# Patient Record
Sex: Male | Born: 1975 | Marital: Married | Smoking: Never smoker
Health system: Southern US, Community
[De-identification: ages and names within clinical notes are randomized; demographics above are authoritative.]

## PROBLEM LIST (undated history)

## (undated) DIAGNOSIS — T4145XA Adverse effect of unspecified anesthetic, initial encounter: Secondary | ICD-10-CM

## (undated) DIAGNOSIS — E669 Obesity, unspecified: Secondary | ICD-10-CM

## (undated) DIAGNOSIS — M5412 Radiculopathy, cervical region: Secondary | ICD-10-CM

## (undated) DIAGNOSIS — T8859XA Other complications of anesthesia, initial encounter: Secondary | ICD-10-CM

## (undated) HISTORY — PX: WISDOM TOOTH EXTRACTION: SHX21

## (undated) HISTORY — PX: TONSILLECTOMY: SUR1361

---
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
## 2019-09-21 ENCOUNTER — Other Ambulatory Visit: Payer: Self-pay | Admitting: Neurosurgery

## 2019-09-21 ENCOUNTER — Other Ambulatory Visit: Payer: Self-pay | Admitting: Internal Medicine

## 2019-09-21 DIAGNOSIS — M5412 Radiculopathy, cervical region: Secondary | ICD-10-CM

## 2019-09-21 DIAGNOSIS — M4312 Spondylolisthesis, cervical region: Secondary | ICD-10-CM

## 2019-09-22 ENCOUNTER — Other Ambulatory Visit (HOSPITAL_COMMUNITY): Payer: Self-pay | Admitting: Neurosurgery

## 2019-09-22 DIAGNOSIS — M4312 Spondylolisthesis, cervical region: Secondary | ICD-10-CM

## 2019-09-22 DIAGNOSIS — M5412 Radiculopathy, cervical region: Secondary | ICD-10-CM

## 2019-09-29 ENCOUNTER — Ambulatory Visit (HOSPITAL_COMMUNITY)
Admission: RE | Admit: 2019-09-29 | Discharge: 2019-09-29 | Disposition: A | Discharge status: Home / Self Care | Payer: BC Managed Care – PPO | Source: Ambulatory Visit | Attending: Neurosurgery | Admitting: Neurosurgery

## 2019-09-29 ENCOUNTER — Other Ambulatory Visit: Payer: Self-pay

## 2019-09-29 DIAGNOSIS — M4312 Spondylolisthesis, cervical region: Secondary | ICD-10-CM | POA: Insufficient documentation

## 2019-09-30 ENCOUNTER — Ambulatory Visit (HOSPITAL_COMMUNITY)
Admission: RE | Admit: 2019-09-30 | Discharge: 2019-09-30 | Disposition: A | Discharge status: Home / Self Care | Payer: BC Managed Care – PPO | Source: Ambulatory Visit | Attending: Neurosurgery | Admitting: Neurosurgery

## 2019-09-30 DIAGNOSIS — M4312 Spondylolisthesis, cervical region: Secondary | ICD-10-CM | POA: Diagnosis not present

## 2019-09-30 DIAGNOSIS — M5412 Radiculopathy, cervical region: Secondary | ICD-10-CM | POA: Insufficient documentation

## 2019-09-30 MED ORDER — GADOBUTROL 1 MMOL/ML IV SOLN
10.0000 mL | Freq: Once | INTRAVENOUS | Status: AC | PRN
  Administered 2019-09-30: 10 mL via INTRAVENOUS

## 2019-10-02 ENCOUNTER — Other Ambulatory Visit: Payer: Self-pay

## 2019-10-02 ENCOUNTER — Other Ambulatory Visit: Payer: Self-pay | Admitting: Radiation Therapy

## 2019-10-02 ENCOUNTER — Inpatient Hospital Stay (HOSPITAL_COMMUNITY)
Admission: AD | Admit: 2019-10-02 | Discharge: 2019-10-05 | DRG: 477 | Disposition: A | Discharge status: Home / Self Care | Payer: BC Managed Care – PPO | Source: Ambulatory Visit | Attending: Internal Medicine | Admitting: Internal Medicine

## 2019-10-02 ENCOUNTER — Encounter (HOSPITAL_COMMUNITY): Payer: Self-pay | Admitting: Internal Medicine

## 2019-10-02 DIAGNOSIS — C7951 Secondary malignant neoplasm of bone: Secondary | ICD-10-CM | POA: Diagnosis present

## 2019-10-02 DIAGNOSIS — R030 Elevated blood-pressure reading, without diagnosis of hypertension: Secondary | ICD-10-CM | POA: Diagnosis present

## 2019-10-02 DIAGNOSIS — T380X5A Adverse effect of glucocorticoids and synthetic analogues, initial encounter: Secondary | ICD-10-CM | POA: Diagnosis present

## 2019-10-02 DIAGNOSIS — K760 Fatty (change of) liver, not elsewhere classified: Secondary | ICD-10-CM | POA: Diagnosis present

## 2019-10-02 DIAGNOSIS — Z6841 Body Mass Index (BMI) 40.0 and over, adult: Secondary | ICD-10-CM

## 2019-10-02 DIAGNOSIS — X58XXXA Exposure to other specified factors, initial encounter: Secondary | ICD-10-CM | POA: Diagnosis present

## 2019-10-02 DIAGNOSIS — U071 COVID-19: Secondary | ICD-10-CM | POA: Diagnosis present

## 2019-10-02 DIAGNOSIS — Z23 Encounter for immunization: Secondary | ICD-10-CM

## 2019-10-02 DIAGNOSIS — Z9181 History of falling: Secondary | ICD-10-CM | POA: Diagnosis not present

## 2019-10-02 DIAGNOSIS — Z20828 Contact with and (suspected) exposure to other viral communicable diseases: Secondary | ICD-10-CM | POA: Diagnosis present

## 2019-10-02 DIAGNOSIS — N888 Other specified noninflammatory disorders of cervix uteri: Secondary | ICD-10-CM

## 2019-10-02 DIAGNOSIS — D72829 Elevated white blood cell count, unspecified: Secondary | ICD-10-CM | POA: Diagnosis present

## 2019-10-02 DIAGNOSIS — E669 Obesity, unspecified: Secondary | ICD-10-CM | POA: Diagnosis present

## 2019-10-02 DIAGNOSIS — C801 Malignant (primary) neoplasm, unspecified: Secondary | ICD-10-CM | POA: Diagnosis present

## 2019-10-02 HISTORY — DX: Obesity, unspecified: E66.9

## 2019-10-02 LAB — GLUCOSE, CAPILLARY: Glucose-Capillary: 116 mg/dL — ABNORMAL HIGH (ref 70–99)

## 2019-10-02 MED ORDER — CYCLOBENZAPRINE HCL 5 MG PO TABS
5.0000 mg | ORAL_TABLET | Freq: Three times a day (TID) | ORAL | Status: DC | PRN
  Administered 2019-10-04 (×2): 5 mg via ORAL
  Filled 2019-10-02 (×4): qty 1

## 2019-10-02 MED ORDER — IBUPROFEN 200 MG PO TABS
600.0000 mg | ORAL_TABLET | Freq: Four times a day (QID) | ORAL | Status: DC | PRN
  Administered 2019-10-05 (×2): 600 mg via ORAL
  Filled 2019-10-02 (×2): qty 3

## 2019-10-02 MED ORDER — HYDROCODONE-ACETAMINOPHEN 5-325 MG PO TABS
1.0000 | ORAL_TABLET | Freq: Four times a day (QID) | ORAL | Status: DC | PRN
  Administered 2019-10-03 (×2): 2 via ORAL
  Administered 2019-10-03: 1 via ORAL
  Administered 2019-10-04 – 2019-10-05 (×3): 2 via ORAL
  Filled 2019-10-02 (×7): qty 2

## 2019-10-02 MED ORDER — ACETAMINOPHEN 650 MG RE SUPP: 650.0000 mg | Freq: Four times a day (QID) | RECTAL | Status: DC | PRN

## 2019-10-02 MED ORDER — PANTOPRAZOLE SODIUM 40 MG PO TBEC
40.0000 mg | DELAYED_RELEASE_TABLET | Freq: Every day | ORAL | Status: DC
  Administered 2019-10-02 – 2019-10-05 (×4): 40 mg via ORAL
  Filled 2019-10-02 (×4): qty 1

## 2019-10-02 MED ORDER — IBUPROFEN 200 MG PO TABS: 600.0000 mg | ORAL_TABLET | Freq: Four times a day (QID) | ORAL | Status: DC | PRN

## 2019-10-02 MED ORDER — INFLUENZA VAC SPLIT QUAD 0.5 ML IM SUSY
0.5000 mL | PREFILLED_SYRINGE | INTRAMUSCULAR | Status: AC
  Administered 2019-10-04: 0.5 mL via INTRAMUSCULAR
  Filled 2019-10-02: qty 0.5

## 2019-10-02 MED ORDER — ONDANSETRON HCL 4 MG/2ML IJ SOLN: 4.0000 mg | Freq: Four times a day (QID) | INTRAMUSCULAR | Status: DC | PRN

## 2019-10-02 MED ORDER — DEXAMETHASONE 4 MG PO TABS
4.0000 mg | ORAL_TABLET | Freq: Four times a day (QID) | ORAL | Status: DC
  Administered 2019-10-02 – 2019-10-05 (×11): 4 mg via ORAL
  Filled 2019-10-02 (×14): qty 1

## 2019-10-02 MED ORDER — ACETAMINOPHEN 325 MG PO TABS: 650.0000 mg | ORAL_TABLET | Freq: Four times a day (QID) | ORAL | Status: DC | PRN

## 2019-10-02 MED ORDER — ENOXAPARIN SODIUM 40 MG/0.4ML ~~LOC~~ SOLN
40.0000 mg | SUBCUTANEOUS | Status: DC
  Administered 2019-10-02: 40 mg via SUBCUTANEOUS
  Filled 2019-10-02: qty 0.4

## 2019-10-02 MED ORDER — LORATADINE 10 MG PO TABS
10.0000 mg | ORAL_TABLET | Freq: Every day | ORAL | Status: DC
  Administered 2019-10-03 – 2019-10-05 (×3): 10 mg via ORAL
  Filled 2019-10-02 (×3): qty 1

## 2019-10-02 MED ORDER — ONDANSETRON HCL 4 MG PO TABS: 4.0000 mg | ORAL_TABLET | Freq: Four times a day (QID) | ORAL | Status: DC | PRN

## 2019-10-02 NOTE — Plan of Care (Signed)
Current plan of care discussed with the patient.

## 2019-10-02 NOTE — H&P (Addendum)
History and Physical    Scott Eaton [id number] DOB: [birth year] DOA: 10/02/2019  PCP: Patient, No Pcp Per  Patient coming from: Home  I have personally briefly reviewed patient's old medical records in [place]  Chief Complaint: Neck pain, arm weakness  HPI: Scott Eaton is a 43 y.o. male with medical history significant of obesity but otherwise healthy.  Patient has been having neck pain since a fall down stairs last [place].  This has progressed to B arm weakness these past 2 weeks.  Patient followed by Dr. Vertell Limber.  Dr. Vertell Limber obtained CT scan on 23rd and MRI on 24th of C spine which are suggestive of metastatic CA to neck, thecal sac impingement but no cord signal abnormality, primary not known.  Patient sent in to [facility] as a direct admit from Dr. Melven Sartorius office  Review of Systems: As per HPI, otherwise all review of systems negative.  Past Medical History:  Diagnosis Date  . Obesity     History reviewed. No pertinent surgical history.   reports that he has never smoked. He does not have any smokeless tobacco history on file. He reports current alcohol use. He reports that he does not use drugs.  No Known Allergies  Family History  Problem Relation Age of Onset  . Cancer Neg Hx      Prior to Admission medications   Not on File    Physical Exam: Vitals:   10/02/19 2000  BP: 127/69  Pulse: (!) 102  Resp: 16  Temp: 98.6 F (37 C)  TempSrc: Oral  SpO2: 98%    Constitutional: NAD, calm, comfortable Eyes: PERRL, lids and conjunctivae normal ENMT: Mucous membranes are moist. Posterior pharynx clear of any exudate or lesions.Normal dentition.  Neck: normal, supple, no masses, no thyromegaly Respiratory: clear to auscultation bilaterally, no wheezing, no crackles. Normal respiratory effort. No accessory muscle use.  Cardiovascular: Regular rate and rhythm, no murmurs / rubs / gallops. No extremity edema. 2+ pedal pulses. No carotid bruits.  Abdomen: no  tenderness, no masses palpated. No hepatosplenomegaly. Bowel sounds positive.  Musculoskeletal: no clubbing / cyanosis. No joint deformity upper and lower extremities. Good ROM, no contractures. Normal muscle tone.  Skin: no rashes, lesions, ulcers. No induration Neurologic: CN 2-12 grossly intact. Sensation intact, DTR normal. Mild BUE weakness. Psychiatric: Normal judgment and insight. Alert and oriented x 3. Normal mood.    Labs on Admission: I have personally reviewed following labs and imaging studies  CBC: No results for input(s): WBC, NEUTROABS, HGB, HCT, MCV, PLT in the last 168 hours. Basic Metabolic Panel: No results for input(s): NA, K, CL, CO2, GLUCOSE, BUN, CREATININE, CALCIUM, MG, PHOS in the last 168 hours. GFR: CrCl cannot be calculated (No successful lab value found.). Liver Function Tests: No results for input(s): AST, ALT, ALKPHOS, BILITOT, PROT, ALBUMIN in the last 168 hours. No results for input(s): LIPASE, AMYLASE in the last 168 hours. No results for input(s): AMMONIA in the last 168 hours. Coagulation Profile: No results for input(s): INR, PROTIME in the last 168 hours. Cardiac Enzymes: No results for input(s): CKTOTAL, CKMB, CKMBINDEX, TROPONINI in the last 168 hours. BNP (last 3 results) No results for input(s): PROBNP in the last 8760 hours. HbA1C: No results for input(s): HGBA1C in the last 72 hours. CBG: No results for input(s): GLUCAP in the last 168 hours. Lipid Profile: No results for input(s): CHOL, HDL, LDLCALC, TRIG, CHOLHDL, LDLDIRECT in the last 72 hours. Thyroid Function Tests: No results for input(s): TSH,  T4TOTAL, FREET4, T3FREE, THYROIDAB in the last 72 hours. Anemia Panel: No results for input(s): VITAMINB12, FOLATE, FERRITIN, TIBC, IRON, RETICCTPCT in the last 72 hours. Urine analysis: No results found for: COLORURINE, APPEARANCEUR, LABSPEC, PHURINE, GLUCOSEU, HGBUR, BILIRUBINUR, KETONESUR, PROTEINUR, UROBILINOGEN, NITRITE, LEUKOCYTESUR   Radiological Exams on Admission: No results found.  EKG: Independently reviewed.  Assessment/Plan Principal Problem:   Metastatic cancer to spine ([place])    1. 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 Obtain CBC/CMP 2. Plan for CT chest, abd, pelvis to look for primary / dz extent. 3. Surgical vs IR consult for Bx depending on CT findings 4. Neuro checks Q4H 5. Rad onc consult in AM, spoke with Dr. Isidore Moos 1. Did rec to start decadron 2. They will see in AM  DVT prophylaxis: Lovenox Code Status: Full Family Communication: Family at bedside Disposition Plan: Home after admit Consults called: Spoke with Dr. Isidore Moos Admission status: Admit to inpatient  Severity of Illness: The appropriate patient status for this patient is INPATIENT. Inpatient status is judged to be reasonable and necessary in order to provide the required intensity of service to ensure the patient's safety. The patient's presenting symptoms, physical exam findings, and initial radiographic and laboratory data in the context of their chronic comorbidities is felt to place them at high risk for further clinical deterioration. Furthermore, it is not anticipated that the patient will be medically stable for discharge from the hospital within 2 midnights of admission. The following factors support the patient status of inpatient.   IP status for new diagnosis of metastatic CA to C.Spine with cord impingement, unknown primary.   * I certify that at the point of admission it is my clinical judgment that the patient will require inpatient hospital care spanning beyond 2 midnights from the point of admission due to high intensity of service, high risk for further deterioration and high frequency of surveillance required.*    Scott Eaton M. DO [facility]  How to contact the [facility] Attending or Consulting provider [place] or covering provider during after  hours [place], for this patient?  1. Check the care team in [facility] and look for a) attending/consulting [facility] provider listed and b) the [facility] team listed 2. Log into [email or web address]  Amion Physician Scheduling and messaging for groups and whole hospitals  On call and physician scheduling software for group practices, residents, hospitalists and other medical providers for call, clinic, rotation and shift schedules. On[employer] is a hospital-wide system for scheduling doctors and paging doctors on call. EasyPlot is for scientific plotting and data analysis.  [email or web address]  and use [place]'s universal password to access. If you do not have the password, please contact the hospital operator.  3. Locate the [facility] provider you are looking for under [facility] and page to a number that you can be directly reached. 4. If you still have difficulty reaching the provider, please page the [facility] (Director on Call) for the Hospitalists listed on amion for assistance.  10/02/2019, 8:46 PM

## 2019-10-03 ENCOUNTER — Ambulatory Visit
Admit: 2019-10-03 | Discharge: 2019-10-03 | Disposition: A | Discharge status: Home / Self Care | Payer: BC Managed Care – PPO | Attending: Radiation Oncology | Admitting: Radiation Oncology

## 2019-10-03 ENCOUNTER — Inpatient Hospital Stay (HOSPITAL_COMMUNITY): Payer: BC Managed Care – PPO

## 2019-10-03 ENCOUNTER — Encounter (HOSPITAL_COMMUNITY): Payer: Self-pay | Admitting: *Deleted

## 2019-10-03 DIAGNOSIS — C7951 Secondary malignant neoplasm of bone: Secondary | ICD-10-CM

## 2019-10-03 LAB — CBC
HCT: 44.4 % (ref 39.0–52.0)
Hemoglobin: 14.6 g/dL (ref 13.0–17.0)
MCH: 29.5 pg (ref 26.0–34.0)
MCHC: 32.9 g/dL (ref 30.0–36.0)
MCV: 89.7 fL (ref 80.0–100.0)
Platelets: 315 10*3/uL (ref 150–400)
RBC: 4.95 MIL/uL (ref 4.22–5.81)
RDW: 12.6 % (ref 11.5–15.5)
WBC: 8.6 10*3/uL (ref 4.0–10.5)
nRBC: 0 % (ref 0.0–0.2)

## 2019-10-03 LAB — COMPREHENSIVE METABOLIC PANEL
ALT: 68 U/L — ABNORMAL HIGH (ref 0–44)
AST: 26 U/L (ref 15–41)
Albumin: 4.6 g/dL (ref 3.5–5.0)
Alkaline Phosphatase: 69 U/L (ref 38–126)
Anion gap: 9 (ref 5–15)
BUN: 12 mg/dL (ref 6–20)
CO2: 27 mmol/L (ref 22–32)
Calcium: 10 mg/dL (ref 8.9–10.3)
Chloride: 101 mmol/L (ref 98–111)
Creatinine, Ser: 0.81 mg/dL (ref 0.61–1.24)
GFR calc Af Amer: >60 mL/min (ref >60)
GFR calc non Af Amer: >60 mL/min (ref >60)
Glucose, Bld: 154 mg/dL — ABNORMAL HIGH (ref 70–99)
Potassium: 5.1 mmol/L (ref 3.5–5.1)
Sodium: 137 mmol/L (ref 135–145)
Total Bilirubin: 0.4 mg/dL (ref 0.3–1.2)
Total Protein: 7.6 g/dL (ref 6.5–8.1)

## 2019-10-03 LAB — FERRITIN: Ferritin: 227 ng/mL (ref 24–336)

## 2019-10-03 LAB — LACTIC ACID, PLASMA
Lactic Acid, Venous: 3.3 mmol/L (ref 0.5–1.9)
Lactic Acid, Venous: 3.9 mmol/L (ref 0.5–1.9)

## 2019-10-03 LAB — GLUCOSE, CAPILLARY
Glucose-Capillary: 155 mg/dL — ABNORMAL HIGH (ref 70–99)
Glucose-Capillary: 173 mg/dL — ABNORMAL HIGH (ref 70–99)
Glucose-Capillary: 204 mg/dL — ABNORMAL HIGH (ref 70–99)
Glucose-Capillary: 208 mg/dL — ABNORMAL HIGH (ref 70–99)

## 2019-10-03 LAB — HIV ANTIBODY (ROUTINE TESTING W REFLEX): HIV Screen 4th Generation wRfx: NONREACTIVE

## 2019-10-03 LAB — SARS CORONAVIRUS 2 (TAT 6-24 HRS): SARS Coronavirus 2: POSITIVE — AB

## 2019-10-03 LAB — D-DIMER, QUANTITATIVE: D-Dimer, Quant: 0.31 ug/mL-FEU (ref 0.00–0.50)

## 2019-10-03 LAB — C-REACTIVE PROTEIN: CRP: 1 mg/dL — ABNORMAL HIGH (ref <1.0)

## 2019-10-03 MED ORDER — IOHEXOL 300 MG/ML  SOLN: 30.0000 mL | Freq: Once | INTRAMUSCULAR | Status: DC | PRN

## 2019-10-03 MED ORDER — ENOXAPARIN SODIUM 80 MG/0.8ML ~~LOC~~ SOLN
80.0000 mg | SUBCUTANEOUS | Status: DC
  Administered 2019-10-03: 80 mg via SUBCUTANEOUS
  Filled 2019-10-03: qty 0.8

## 2019-10-03 MED ORDER — IOHEXOL 300 MG/ML  SOLN
100.0000 mL | Freq: Once | INTRAMUSCULAR | Status: AC | PRN
  Administered 2019-10-03: 100 mL via INTRAVENOUS

## 2019-10-03 MED ORDER — SODIUM CHLORIDE (PF) 0.9 % IJ SOLN
INTRAMUSCULAR | Status: AC
  Filled 2019-10-03: qty 50

## 2019-10-03 NOTE — Progress Notes (Signed)
[facility] called stating that pts COVID positive.

## 2019-10-03 NOTE — Progress Notes (Signed)
Patient's Vitals: 99.56F;HR115;RR16;172/83 (MAP105);97% Room Air. PCP was notified

## 2019-10-03 NOTE — Progress Notes (Signed)
Pt transported to CT with RN present. Pt transported to room 1436 after CT complete. Pt denies pain at this time and pts wife updated. Report given to 4th floor RN at the bedside.

## 2019-10-03 NOTE — Progress Notes (Signed)
CRITICAL VALUE ALERT  Critical Value:  Lactic Acid 3.3  Date & Time Notied:  10/03/2019 1735  Provider Notified: Dr. Wyonia Hough  Orders Received/Actions taken:

## 2019-10-03 NOTE — Progress Notes (Signed)
Patient's Vitals were: 98.29F;HR  112; RR 18;171/70 (MAP 95);98% Room Air.  PCP was notified.

## 2019-10-03 NOTE — Consult Note (Signed)
[facility]  [phone] Fax:([phone] [place]  Initial inpatient Consultation- conducted via telephone in light of current COVID-19 pandemic and recommendations to limit patient exposure in the healthcare setting.  Referral MD: Dr. 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  The patient reports that he has been having pain to his neck since at least February 2020.  Over the past 3 to 5 weeks, his pain has become more severe.  He has also developed numbness in his bilateral arms and hands.  He has been taking hydrocodone at home for pain control.  The patient was seen by Dr. Vertell Limber as an outpatient.  He initially had a CT of the cervical spine without contrast which showed extensive tumor destroying the left side of the C4 vertebral body with extensive destruction of the left side of the C4 vertebral body, left pedicle, left lamina, spinous process, and a portion of the right lamina, tumor extends into the spinal canal and left neural foramina at C4-5 and C3-4, the tumor compresses the thecal sac and spinal cord at C3-4 and C4-5 and behind the C4 vertebral body, tumor extends into the left lateral recesses and left neural foramina at C3-4 and C4-5 and into the left posterior lateral spinal musculature at C3-4 and C4-5.  He subsequently underwent an MRI of the cervical spine with and without contrast on 09/30/2019.  This showed enhancing tumor of the C4 vertebral body and posterior elements with epidural extension into the left C3-4 and C4-5 neural foramina, metastatic disease is suspected, epidural extension on the left dorsal spinal canal causes mass-effect on the thecal sac without spinal cord change, left vertebral artery is encased by the C4 mass, mass within the right carotid space at the C2 level measuring 3.5 x 2.4 cm  which is concerning for metastatic disease in the context of a known vertebral lesion.  The patient was sent to the hospital for direct admission to begin urgent radiation and for further work-up.  The patient reports other than his neck pain and numbness in his arms and hands, he has been feeling well.  Denies fevers and chills.  Denies anorexia and weight loss.  He has not had any headaches or dizziness.  Patient denies chest discomfort, shortness of breath, cough, hemoptysis.  Denies abdominal pain, nausea, vomiting, constipation, diarrhea.  Denies bleeding.  The patient lives in [place], [place].  He is married.  He has one 45 year old child.  Reports social alcohol use.  Denies history of tobacco.  Denies family history of cancers or blood disorders.  The patient had a positive COVID-19 test upon admission.  Medical oncology was notified of the patient's admission to make further recommendations regarding work-up and recommendations for biopsy.   Past Medical History:  Diagnosis Date  . Obesity   :  History reviewed. No pertinent surgical history.:  Current Facility-Administered Medications  Medication Dose Route Frequency Provider Last Rate Last Dose  . acetaminophen (TYLENOL) tablet 650 mg  650 mg Oral Q6H PRN Etta Quill, DO       Or  . acetaminophen (TYLENOL) suppository 650 mg  650 mg Rectal Q6H PRN Etta Quill, DO      . cyclobenzaprine (FLEXERIL) tablet 5 mg  5 mg Oral TID PRN Etta Quill, DO      . dexamethasone (DECADRON)  tablet 4 mg  4 mg Oral Q6H Jennette Kettle M, DO   4 mg at 10/03/19 1205  . enoxaparin (LOVENOX) injection 80 mg  80 mg Subcutaneous Q24H Minda Ditto, RPH      . HYDROcodone-acetaminophen (NORCO/VICODIN) 5-325 MG per tablet 1-2 tablet  1-2 tablet Oral Q6H PRN Etta Quill, DO   2 tablet at 10/03/19 0645  . ibuprofen (ADVIL) tablet 600 mg  600 mg Oral Q6H PRN Etta Quill, DO      . influenza vac split quadrivalent PF (FLUARIX)  injection 0.5 mL  0.5 mL Intramuscular Tomorrow-1000 Alcario Drought, Jared M, DO      . iohexol (OMNIPAQUE) 300 MG/ML solution 30 mL  30 mL Oral Once PRN Etta Quill, DO      . loratadine (CLARITIN) tablet 10 mg  10 mg Oral Daily Jennette Kettle M, DO   10 mg at 10/03/19 0905  . ondansetron (ZOFRAN) tablet 4 mg  4 mg Oral Q6H PRN Etta Quill, DO       Or  . ondansetron [facility]) injection 4 mg  4 mg Intravenous Q6H PRN Etta Quill, DO      . pantoprazole (PROTONIX) EC tablet 40 mg  40 mg Oral Daily Jennette Kettle M, DO   40 mg at 10/03/19 0905     No Known Allergies:  Family History  Problem Relation Age of Onset  . Cancer Neg Hx   :  Social History   Socioeconomic History  . Marital status: Married    Spouse name: Not on file  . Number of children: Not on file  . Years of education: Not on file  . Highest education level: Not on file  Occupational History  . Not on file  Social Needs  . Financial resource strain: Not on file  . Food insecurity    Worry: Not on file    Inability: Not on file  . Transportation needs    Medical: Not on file    Non-medical: Not on file  Tobacco Use  . Smoking status: Never Smoker  . Smokeless tobacco: Never Used  Substance and Sexual Activity  . Alcohol use: Yes    Comment: occ  . Drug use: Never  . Sexual activity: Not on file  Lifestyle  . Physical activity    Days per week: Not on file    Minutes per session: Not on file  . Stress: Not on file  Relationships  . Social Herbalist on phone: Not on file    Gets together: Not on file    Attends religious service: Not on file    Active member of club or organization: Not on file    Attends meetings of clubs or organizations: Not on file    Relationship status: Not on file  . Intimate partner violence    Fear of current or ex partner: Not on file    Emotionally abused: Not on file    Physically abused: Not on file    Forced sexual activity: Not on file  Other  Topics Concern  . Not on file  Social History Narrative  . Not on file  :  Review of Systems: A comprehensive 14 point review of systems was negative except as noted in the HPI.  Exam: Patient Vitals for the past 24 hrs:  BP Temp Temp src Pulse Resp SpO2 Height Weight  10/03/19 0512 (!) 153/83 97.9 F (36.6 C) - 98 16 98 % - -  10/02/19 2100 - - - 100 16 - - -  10/02/19 2000 127/69 98.6 F (37 C) Oral (!) 102 16 98 % 6\' 1"  (1.854 m) (!) 335 lb (152 kg)    No exam performed due to COVID 19+ status.  Lab Results  Component Value Date   WBC 8.6 10/03/2019   HGB 14.6 10/03/2019   HCT 44.4 10/03/2019   PLT 315 10/03/2019   GLUCOSE 154 (H) 10/03/2019   ALT 68 (H) 10/03/2019   AST 26 10/03/2019   NA 137 10/03/2019   K 5.1 10/03/2019   CL 101 10/03/2019   CREATININE 0.81 10/03/2019   BUN 12 10/03/2019   CO2 27 10/03/2019    Ct Cervical Spine Wo Contrast  Result Date: 09/29/2019 CLINICAL DATA:  Posterior neck pain radiating into both shoulders. EXAM: CT CERVICAL SPINE WITHOUT CONTRAST TECHNIQUE: Multidetector CT imaging of the cervical spine was performed without intravenous contrast. Multiplanar CT image reconstructions were also generated. COMPARISON:  Radiographs dated 09/13/2019 FINDINGS: Alignment: There is a 3 mm anterolisthesis of C3 on C4. Skull base and vertebrae: There is extensive destruction of left side of the C4 vertebral body with extensive destruction of the left lateral mass, left pedicle, left lamina, spinous process, and partial destruction of the right lamina. There is a pathologic fracture involving the vertebral body and left lateral mass. Tumor extends into the spinal canal compressing the spinal cord. Soft tissues and spinal canal: The tumor destroying the C4 vertebral body extends into the spinal canal compressing the spinal cord and thecal sac. The tumor extends into the left neural foramen and is immediately adjacent to the left vertebral artery. Disc levels:   C2-3: Normal. C3-4: 3 mm anterolisthesis of C3 on C4. No appreciable disc bulging or foraminal stenosis. However, there is abnormal soft tissue in and lateral to the left neural foramen obscuring the left vertebral artery with abnormal density in the left lateral recess and left neural foramen with abnormal density surrounding the left side of the thecal sac which likely represents there is also abnormal soft tissue surrounding the left lateral mass in the left transverse process and left lamina consistent with tumor extending into the soft tissues. C4-5: Tumor destroys at least 50% of the C4 vertebral body, the left pedicle, left lateral mass, left lamina, spinous processes, left transverse process, and a portion of the right lamina with pathologic fractures of the vertebral body and lateral mass. Tumor extends into the spinal canal severely compressing the thecal sac. Tumor also extends into the paraspinal soft tissues and around the left vertebral artery and into the left posterolateral spinal musculature at C4. Tumor extends into the left neural foramen. C5-6: Negative. C6-7: Negative. C7-T1: Negative. T1-2 and T2-3: Negative. Upper chest: Normal. Other: None IMPRESSION: 1. 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 2. Tumor extends into the spinal canal and left neural foramina at C4-5 and C3-4. The tumor compresses the thecal sac and spinal cord at C3-4 and C4-5 and behind the C4 vertebral body. 3. Tumor extends into the left lateral recesses and left neural foramina at C3-4 and C4-5 and into the left posterolateral spinal musculature at C3-4 and C4-5. 4. 3 mm anterolisthesis of C3 on C4. at 3:35 pm to Raquel Sarna, Dr. Melven Sartorius assistant, who verbally acknowledged these results. Electronically Signed   By: Jeneen Rinks  Maxwell M.D.   On: 09/29/2019  15:51   Mr Cervical Spine W Wo Contrast  Result Date: 10/01/2019 CLINICAL DATA:  Posterior neck pain with recently demonstrated vertebral tumor. EXAM: MRI CERVICAL SPINE WITHOUT AND WITH CONTRAST TECHNIQUE: Multiplanar and multiecho pulse sequences of the cervical spine, to include the craniocervical junction and cervicothoracic junction, were obtained without and with intravenous contrast. CONTRAST:  8mL GADAVIST GADOBUTROL 1 MMOL/ML IV SOLN COMPARISON:  CT cervical spine 09/29/2019 MR cervical spine 02/28/2019 FINDINGS: Alignment: Grade 1 anterolisthesis at C3-4, unchanged. Vertebrae: There is abnormal low T1/high T2-weighted signal within the C4 vertebral body and extending into the left posterior elements. There is associated abnormal contrast enhancement that extends into the adjacent soft tissues. No other focal vertebral lesion. Vertebral body heights are normal. Cord: There is epidural extension of the tumor along the left dorsal aspect of the spinal canal at C4. Posterior Fossa, vertebral arteries, paraspinal tissues: The left vertebral artery is encased by the abnormally enhancing material. There is a mass within the right carotid space at the C2 level that measures 3.5 x 2.4 cm. Disc levels: C1-C2: Normal. C2-C3: Normal disc space and facets. No spinal canal or neuroforaminal stenosis. C3-C4: There is extension of the C4 tumor into the left C3-4 neural foramen. There is tumor along the left dorsal epidural space. There is attenuation of the thecal sac. C4-C5: As above, contrast enhancing tumor of the left vertebral body and posterior elements that extends to the left epidural space and the left C3-4 and C4-5 neural foramina. The thecal sac is effaced due to mass effect from the tumor. No signal change within the spinal cord. C5-C6: Normal disc space and facets. No spinal canal or neuroforaminal stenosis. C6-C7: Normal disc space and facets. No spinal canal or neuroforaminal stenosis. C7-T1: Normal  disc space and facets. No spinal canal or neuroforaminal stenosis. IMPRESSION: 1. Enhancing tumor of the C4 vertebral body and posterior elements with epidural extension into the left C3-4 and C4-5 neural foramina. Metastatic disease is suspected. 2. Epidural extension on the left dorsal spinal canal causes mass effect on the thecal sac without spinal cord signal change. 3. The left vertebral artery is encased by the C4 mass. 4. Mass within the right carotid space at the C2 level, measuring 3.5 x 2.4 cm. This is concerning for metastatic disease in the context of the known vertebral lesion. Paraganglioma is an alternative possibility. Electronically Signed   By: Ulyses Jarred M.D.   On: 10/01/2019 22:19     Ct Cervical Spine Wo Contrast  Result Date: 09/29/2019 CLINICAL DATA:  Posterior neck pain radiating into both shoulders. EXAM: CT CERVICAL SPINE WITHOUT CONTRAST TECHNIQUE: Multidetector CT imaging of the cervical spine was performed without intravenous contrast. Multiplanar CT image reconstructions were also generated. COMPARISON:  Radiographs dated 09/13/2019 FINDINGS: Alignment: There is a 3 mm anterolisthesis of C3 on C4. Skull base and vertebrae: There is extensive destruction of left side of the C4 vertebral body with extensive destruction of the left lateral mass, left pedicle, left lamina, spinous process, and partial destruction of the right lamina. There is a pathologic fracture involving the vertebral body and left lateral mass. Tumor extends into the spinal canal compressing the spinal cord. Soft tissues and spinal canal: The tumor destroying the C4 vertebral body extends into the spinal canal compressing the spinal cord and thecal sac. The tumor extends into the left neural foramen and is immediately adjacent to the left vertebral artery. Disc levels:  C2-3: Normal.  C3-4: 3 mm anterolisthesis of C3 on C4. No appreciable disc bulging or foraminal stenosis. However, there is abnormal soft  tissue in and lateral to the left neural foramen obscuring the left vertebral artery with abnormal density in the left lateral recess and left neural foramen with abnormal density surrounding the left side of the thecal sac which likely represents there is also abnormal soft tissue surrounding the left lateral mass in the left transverse process and left lamina consistent with tumor extending into the soft tissues. C4-5: Tumor destroys at least 50% of the C4 vertebral body, the left pedicle, left lateral mass, left lamina, spinous processes, left transverse process, and a portion of the right lamina with pathologic fractures of the vertebral body and lateral mass. Tumor extends into the spinal canal severely compressing the thecal sac. Tumor also extends into the paraspinal soft tissues and around the left vertebral artery and into the left posterolateral spinal musculature at C4. Tumor extends into the left neural foramen. C5-6: Negative. C6-7: Negative. C7-T1: Negative. T1-2 and T2-3: Negative. Upper chest: Normal. Other: None IMPRESSION: 1. 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 2. Tumor extends into the spinal canal and left neural foramina at C4-5 and C3-4. The tumor compresses the thecal sac and spinal cord at C3-4 and C4-5 and behind the C4 vertebral body. 3. Tumor extends into the left lateral recesses and left neural foramina at C3-4 and C4-5 and into the left posterolateral spinal musculature at C3-4 and C4-5. 4. 3 mm anterolisthesis of C3 on C4. at 3:35 pm to Raquel Sarna, Dr. Melven Sartorius assistant, who verbally acknowledged these results. Electronically Signed   By: Lorriane Shire M.D.   On: 09/29/2019 15:51   Mr Cervical Spine W Wo Contrast  Result Date: 10/01/2019 CLINICAL DATA:  Posterior neck pain with recently demonstrated vertebral  tumor. EXAM: MRI CERVICAL SPINE WITHOUT AND WITH CONTRAST TECHNIQUE: Multiplanar and multiecho pulse sequences of the cervical spine, to include the craniocervical junction and cervicothoracic junction, were obtained without and with intravenous contrast. CONTRAST:  83mL GADAVIST GADOBUTROL 1 MMOL/ML IV SOLN COMPARISON:  CT cervical spine 09/29/2019 MR cervical spine 02/28/2019 FINDINGS: Alignment: Grade 1 anterolisthesis at C3-4, unchanged. Vertebrae: There is abnormal low T1/high T2-weighted signal within the C4 vertebral body and extending into the left posterior elements. There is associated abnormal contrast enhancement that extends into the adjacent soft tissues. No other focal vertebral lesion. Vertebral body heights are normal. Cord: There is epidural extension of the tumor along the left dorsal aspect of the spinal canal at C4. Posterior Fossa, vertebral arteries, paraspinal tissues: The left vertebral artery is encased by the abnormally enhancing material. There is a mass within the right carotid space at the C2 level that measures 3.5 x 2.4 cm. Disc levels: C1-C2: Normal. C2-C3: Normal disc space and facets. No spinal canal or neuroforaminal stenosis. C3-C4: There is extension of the C4 tumor into the left C3-4 neural foramen. There is tumor along the left dorsal epidural space. There is attenuation of the thecal sac. C4-C5: As above, contrast enhancing tumor of the left vertebral body and posterior elements that extends to the left epidural space and the left C3-4 and C4-5 neural foramina. The thecal sac is effaced due to mass effect from the tumor. No signal change within the spinal cord. C5-C6: Normal disc space and  facets. No spinal canal or neuroforaminal stenosis. C6-C7: Normal disc space and facets. No spinal canal or neuroforaminal stenosis. C7-T1: Normal disc space and facets. No spinal canal or neuroforaminal stenosis. IMPRESSION: 1. Enhancing tumor of the C4 vertebral body and posterior  elements with epidural extension into the left C3-4 and C4-5 neural foramina. Metastatic disease is suspected. 2. Epidural extension on the left dorsal spinal canal causes mass effect on the thecal sac without spinal cord signal change. 3. The left vertebral artery is encased by the C4 mass. 4. Mass within the right carotid space at the C2 level, measuring 3.5 x 2.4 cm. This is concerning for metastatic disease in the context of the known vertebral lesion. Paraganglioma is an alternative possibility. Electronically Signed   By: Ulyses Jarred M.D.   On: 10/01/2019 22:19   Assessment and Plan:  1. 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  COVID-19 positive  -Recommend CT the chest, abdomen, pelvis with contrast for initial staging and to determine the best target to biopsy.  This will be performed later today.  Will make recommendations regarding biopsy once these results are available.  We will plan to schedule the patient with medical oncology as an outpatient. -Radiation oncology is aware of the patient's admission and will perform a consult later today.  The patient will need urgent radiation given his impending cord compression. -Dexamethasone per radiation oncology recommendations. -Management of COVID-19 per hospitalist.  He is currently asymptomatic.  Thank you for this referral.   Mikey Bussing, DNP, AGPCNP-BC, AOCNP

## 2019-10-03 NOTE — Progress Notes (Signed)
   10/03/19 1530  Vitals  Temp 98.3 F (36.8 C)  Temp Source Oral  BP (!) 151/87  MAP (mmHg) 104  BP Location Right Arm  BP Method Automatic  Patient Position (if appropriate) Sitting  Pulse Rate (!) 124  Resp 20  Oxygen Therapy  SpO2 98 %  O2 Device Room Air  MEWS Score  MEWS RR 0  MEWS Pulse 2  MEWS Systolic 0  MEWS LOC 0  MEWS Temp 0  MEWS Score 2  MEWS Score Color Yellow  MEWS Assessment  Is this an acute change? Yes  MEWS guidelines implemented *See Row Information* Yellow  Provider Notification  Provider Name/Title Dr. Wyonia Hough  Date Provider Notified 10/03/19  Time Provider Notified 1552  Notification Type Page  Notification Reason Change in status

## 2019-10-03 NOTE — Progress Notes (Signed)
PROGRESS NOTE    Scott Eaton  [id number] DOB: 05-07-1976 DOA: 10/02/2019 PCP: Patient, No Pcp Per   Brief Narrative:  Scott Eaton is an unfortunate 43 y.o. male with medical history significant of obesity but otherwise healthy PTA.  Patient has been having neck pain since a fall down stairs last Xmass.  This has progressed to B arm weakness these past 2 weeks.  Patient followed by Dr. Vertell Limber.  Dr. Vertell Limber obtained CT scan on 23rd and MRI on 24th of C spine which are suggestive of metastatic CA to neck, thecal sac impingement but no cord signal abnormality, primary not known.  Patient sent in to [facility] as a direct admit from Dr. 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 Follow CBC/CMP 2. Plan for CT chest, abd, pelvis to look for primary / dz extent. 3. Surgical vs IR consult for Bx depending on CT findings 4. Neuro checks Q4H 5. Rad onc consult in AM, spoke with Dr. Isidore Moos 1. Did rec to start decadron 2. Await their consult-planned on seeing this AM per admitting [facility]  COVID positive:        1 patient came back Covid positive this morning after admission last night.        2 patient denies known exposures, he is asymptomatic on room air        3 we will follow inflammatory markers    DVT prophylaxis: Lovenox SQ  Code Status: fULL    Code Status Orders  (From admission, onward)         Start     Ordered   10/02/19 2017  Full code  Continuous     10/02/19 2018        Code Status History    This patient has a current code status but no historical code status.   Advance Care Planning Activity     Family Communication: Discussed with wife Disposition Plan:   Patient will remain inpatient for further work-up to include CT chest abdomen pelvis, radiation oncology for possible emergent radiation, IV steroids.   Case complicated with Covid positive test.  Patient not medically ready for discharge consults called: None Admission status: Inpatient   Consultants:   rad onc on admission  Procedures:  Ct Cervical Spine Wo Contrast  Result Date: 09/29/2019 CLINICAL DATA:  Posterior neck pain radiating into both shoulders. EXAM: CT CERVICAL SPINE WITHOUT CONTRAST TECHNIQUE: Multidetector CT imaging of the cervical spine was performed without intravenous contrast. Multiplanar CT image reconstructions were also generated. COMPARISON:  Radiographs dated 09/13/2019 FINDINGS: Alignment: There is a 3 mm anterolisthesis of C3 on C4. Skull base and vertebrae: There is extensive destruction of left side of the C4 vertebral body with extensive destruction of the left lateral mass, left pedicle, left lamina, spinous process, and partial destruction of the right lamina. There is a pathologic fracture involving the vertebral body and left lateral mass. Tumor extends into the spinal canal compressing the spinal cord. Soft tissues and spinal canal: The tumor destroying the C4 vertebral body extends into the spinal canal compressing the spinal cord and thecal sac. The tumor extends into the left neural foramen and is immediately adjacent to the left vertebral artery. Disc levels:  C2-3: Normal. C3-4: 3 mm anterolisthesis of C3 on C4. No appreciable disc bulging or foraminal stenosis. However, there is abnormal  soft tissue in and lateral to the left neural foramen obscuring the left vertebral artery with abnormal density in the left lateral recess and left neural foramen with abnormal density surrounding the left side of the thecal sac which likely represents there is also abnormal soft tissue surrounding the left lateral mass in the left transverse process and left lamina consistent with tumor extending into the soft tissues. C4-5: Tumor destroys at least 50% of the C4 vertebral body, the left pedicle, left lateral mass, left lamina,  spinous processes, left transverse process, and a portion of the right lamina with pathologic fractures of the vertebral body and lateral mass. Tumor extends into the spinal canal severely compressing the thecal sac. Tumor also extends into the paraspinal soft tissues and around the left vertebral artery and into the left posterolateral spinal musculature at C4. Tumor extends into the left neural foramen. C5-6: Negative. C6-7: Negative. C7-T1: Negative. T1-2 and T2-3: Negative. Upper chest: Normal. Other: None IMPRESSION: 1. 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 2. Tumor extends into the spinal canal and left neural foramina at C4-5 and C3-4. The tumor compresses the thecal sac and spinal cord at C3-4 and C4-5 and behind the C4 vertebral body. 3. Tumor extends into the left lateral recesses and left neural foramina at C3-4 and C4-5 and into the left posterolateral spinal musculature at C3-4 and C4-5. 4. 3 mm anterolisthesis of C3 on C4. at 3:35 pm to Raquel Sarna, Dr. Melven Sartorius assistant, who verbally acknowledged these results. Electronically Signed   By: Lorriane Shire M.D.   On: 09/29/2019 15:51   Mr Cervical Spine W Wo Contrast  Result Date: 10/01/2019 CLINICAL DATA:  Posterior neck pain with recently demonstrated vertebral tumor. EXAM: MRI CERVICAL SPINE WITHOUT AND WITH CONTRAST TECHNIQUE: Multiplanar and multiecho pulse sequences of the cervical spine, to include the craniocervical junction and cervicothoracic junction, were obtained without and with intravenous contrast. CONTRAST:  82mL GADAVIST GADOBUTROL 1 MMOL/ML IV SOLN COMPARISON:  CT cervical spine 09/29/2019 MR cervical spine 02/28/2019 FINDINGS: Alignment: Grade 1 anterolisthesis at C3-4, unchanged. Vertebrae: There is abnormal low T1/high T2-weighted signal within the C4  vertebral body and extending into the left posterior elements. There is associated abnormal contrast enhancement that extends into the adjacent soft tissues. No other focal vertebral lesion. Vertebral body heights are normal. Cord: There is epidural extension of the tumor along the left dorsal aspect of the spinal canal at C4. Posterior Fossa, vertebral arteries, paraspinal tissues: The left vertebral artery is encased by the abnormally enhancing material. There is a mass within the right carotid space at the C2 level that measures 3.5 x 2.4 cm. Disc levels: C1-C2: Normal. C2-C3: Normal disc space and facets. No spinal canal or neuroforaminal stenosis. C3-C4: There is extension of the C4 tumor into the left C3-4 neural foramen. There is tumor along the left dorsal epidural space. There is attenuation of the thecal sac. C4-C5: As above, contrast enhancing tumor of the left vertebral body and posterior elements that extends to the left epidural space and the left C3-4 and C4-5 neural foramina. The thecal sac is effaced due to mass effect from the tumor. No signal change within the spinal cord. C5-C6: Normal disc space and facets. No spinal canal or neuroforaminal stenosis. C6-C7: Normal disc space and facets. No spinal canal or neuroforaminal stenosis.  C7-T1: Normal disc space and facets. No spinal canal or neuroforaminal stenosis. IMPRESSION: 1. Enhancing tumor of the C4 vertebral body and posterior elements with epidural extension into the left C3-4 and C4-5 neural foramina. Metastatic disease is suspected. 2. Epidural extension on the left dorsal spinal canal causes mass effect on the thecal sac without spinal cord signal change. 3. The left vertebral artery is encased by the C4 mass. 4. Mass within the right carotid space at the C2 level, measuring 3.5 x 2.4 cm. This is concerning for metastatic disease in the context of the known vertebral lesion. Paraganglioma is an alternative possibility. Electronically Signed    By: Ulyses Jarred M.D.   On: 10/01/2019 22:19     Antimicrobials:   none   Subjective: Patient reports feeling better pain well controlled and secondly improved since starting steroids  Covid test did come back positive this morning  Objective: Vitals:   10/02/19 2000 10/02/19 2100 10/03/19 0512  BP: 127/69  (!) 153/83  Pulse: (!) 102 100 98  Resp: 16 16 16   Temp: 98.6 F (37 C)  97.9 F (36.6 C)  TempSrc: Oral    SpO2: 98%  98%  Weight: (!) 152 kg    Height: 6\' 1"  (1.854 m)     No intake or output data in the 24 hours ending 10/03/19 1435 Filed Weights   10/02/19 2000  Weight: (!) 152 kg    Examination:  General exam: Appears calm and comfortable  Respiratory system: Clear to auscultation. Respiratory effort normal. Cardiovascular system: S1 & S2 heard, RRR. No JVD, murmurs, rubs, gallops or clicks. No pedal edema. Gastrointestinal system: Abdomen is nondistended, soft and nontender. No organomegaly or masses felt. Normal bowel sounds heard. Central nervous system: Alert and oriented. No focal neurological deficits. Extremities: Is moving all 4 extremities freely, neurovascularly intact. Skin: No rashes, lesions or ulcers Psychiatry: Judgement and insight appear normal. Mood & affect appropriate.     Data Reviewed: I have personally reviewed following labs and imaging studies  CBC: Recent Labs  Lab 10/03/19 0625  WBC 8.6  HGB 14.6  HCT 44.4  MCV 89.7  PLT 123456   Basic Metabolic Panel: Recent Labs  Lab 10/03/19 0625  NA 137  K 5.1  CL 101  CO2 27  GLUCOSE 154*  BUN 12  CREATININE 0.81  CALCIUM 10.0   GFR: Estimated Creatinine Clearance: 177.1 mL/min (by C-G formula based on SCr of 0.81 mg/dL). Liver Function Tests: Recent Labs  Lab 10/03/19 0625  AST 26  ALT 68*  ALKPHOS 69  BILITOT 0.4  PROT 7.6  ALBUMIN 4.6   No results for input(s): LIPASE, AMYLASE in the last 168 hours. No results for input(s): AMMONIA in the last 168  hours. Coagulation Profile: No results for input(s): INR, PROTIME in the last 168 hours. Cardiac Enzymes: No results for input(s): CKTOTAL, CKMB, CKMBINDEX, TROPONINI in the last 168 hours. BNP (last 3 results) No results for input(s): PROBNP in the last 8760 hours. HbA1C: No results for input(s): HGBA1C in the last 72 hours. CBG: Recent Labs  Lab 10/02/19 2231 10/03/19 0742 10/03/19 1204  GLUCAP 116* 155* 173*   Lipid Profile: No results for input(s): CHOL, HDL, LDLCALC, TRIG, CHOLHDL, LDLDIRECT in the last 72 hours. Thyroid Function Tests: No results for input(s): TSH, T4TOTAL, FREET4, T3FREE, THYROIDAB in the last 72 hours. Anemia Panel: No results for input(s): VITAMINB12, FOLATE, FERRITIN, TIBC, IRON, RETICCTPCT in the last 72 hours. Sepsis Labs: No results for  input(s): PROCALCITON, LATICACIDVEN in the last 168 hours.  Recent Results (from the past 240 hour(s))  SARS CORONAVIRUS 2 (TAT 6-24 HRS) Nasopharyngeal Nasopharyngeal Swab     Status: Abnormal   Collection Time: 10/02/19  8:57 PM   Specimen: Nasopharyngeal Swab  Result Value Ref Range Status   SARS Coronavirus 2 POSITIVE (A) NEGATIVE Final    Comment: RESULT CALLED TO, READ BACK BY AND VERIFIED WITH: Jorge Ny RN 9:30 10/03/19 ([email or web address]) (NOTE) SARS-CoV-2 target nucleic acids are DETECTED. The SARS-CoV-2 RNA is generally detectable in upper and lower respiratory specimens during the acute phase of infection. Positive results are indicative of active infection with SARS-CoV-2. Clinical  correlation with patient history and other diagnostic information is necessary to determine patient infection status. Positive results do  not rule out bacterial infection or co-infection with other viruses. The expected result is Negative. Fact Sheet for Patients: SugarRoll.be Fact Sheet for Healthcare Providers: https://www.woods-mathews.com/ This test is not yet approved or cleared by  the [place] FDA and  has been authorized for detection and/or diagnosis of SARS-CoV-2 by FDA under an Emergency Use Authorization (EUA). This EUA will remain  in effect (meaning this test can be used) for  the duration of the COVID-19 declaration under Section 564(b)(1) of the Act, 21 U.S.C. section 360bbb-3(b)(1), unless the authorization is terminated or revoked sooner. Performed at [facility], [place] [address], [place], [place] [address]          Radiology Studies: No results found.      Scheduled Meds:  dexamethasone  4 mg Oral Q6H   enoxaparin (LOVENOX) injection  80 mg Subcutaneous Q24H   influenza vac split quadrivalent PF  0.5 mL Intramuscular Tomorrow-1000   loratadine  10 mg Oral Daily   pantoprazole  40 mg Oral Daily   Continuous Infusions:   LOS: 1 day    Time spent: 35 min    Nicolette Bang, MD [facility]  If 7PM-7AM, please contact night-coverage  10/03/2019, 2:35 PM

## 2019-10-04 ENCOUNTER — Other Ambulatory Visit: Payer: Self-pay | Admitting: Radiation Therapy

## 2019-10-04 ENCOUNTER — Inpatient Hospital Stay (HOSPITAL_COMMUNITY): Payer: BC Managed Care – PPO

## 2019-10-04 DIAGNOSIS — D72829 Elevated white blood cell count, unspecified: Secondary | ICD-10-CM

## 2019-10-04 DIAGNOSIS — U071 COVID-19: Secondary | ICD-10-CM

## 2019-10-04 LAB — CBC WITH DIFFERENTIAL/PLATELET
Abs Immature Granulocytes: 0.13 10*3/uL — ABNORMAL HIGH (ref 0.00–0.07)
Basophils Absolute: 0 10*3/uL (ref 0.0–0.1)
Basophils Relative: 0 %
Eosinophils Absolute: 0 10*3/uL (ref 0.0–0.5)
Eosinophils Relative: 0 %
HCT: 46.2 % (ref 39.0–52.0)
Hemoglobin: 14.9 g/dL (ref 13.0–17.0)
Immature Granulocytes: 1 %
Lymphocytes Relative: 5 %
Lymphs Abs: 1.1 10*3/uL (ref 0.7–4.0)
MCH: 29.3 pg (ref 26.0–34.0)
MCHC: 32.3 g/dL (ref 30.0–36.0)
MCV: 90.9 fL (ref 80.0–100.0)
Monocytes Absolute: 0.9 10*3/uL (ref 0.1–1.0)
Monocytes Relative: 5 %
Neutro Abs: 17.8 10*3/uL — ABNORMAL HIGH (ref 1.7–7.7)
Neutrophils Relative %: 89 %
Platelets: 378 10*3/uL (ref 150–400)
RBC: 5.08 MIL/uL (ref 4.22–5.81)
RDW: 13 % (ref 11.5–15.5)
WBC: 19.9 10*3/uL — ABNORMAL HIGH (ref 4.0–10.5)
nRBC: 0 % (ref 0.0–0.2)

## 2019-10-04 LAB — COMPREHENSIVE METABOLIC PANEL
ALT: 62 U/L — ABNORMAL HIGH (ref 0–44)
AST: 27 U/L (ref 15–41)
Albumin: 4.8 g/dL (ref 3.5–5.0)
Alkaline Phosphatase: 58 U/L (ref 38–126)
Anion gap: 13 (ref 5–15)
BUN: 15 mg/dL (ref 6–20)
CO2: 23 mmol/L (ref 22–32)
Calcium: 9.9 mg/dL (ref 8.9–10.3)
Chloride: 100 mmol/L (ref 98–111)
Creatinine, Ser: 0.76 mg/dL (ref 0.61–1.24)
GFR calc Af Amer: >60 mL/min (ref >60)
GFR calc non Af Amer: >60 mL/min (ref >60)
Glucose, Bld: 185 mg/dL — ABNORMAL HIGH (ref 70–99)
Potassium: 4.5 mmol/L (ref 3.5–5.1)
Sodium: 136 mmol/L (ref 135–145)
Total Bilirubin: 0.3 mg/dL (ref 0.3–1.2)
Total Protein: 7.9 g/dL (ref 6.5–8.1)

## 2019-10-04 LAB — MAGNESIUM: Magnesium: 2.1 mg/dL (ref 1.7–2.4)

## 2019-10-04 LAB — GLUCOSE, CAPILLARY
Glucose-Capillary: 159 mg/dL — ABNORMAL HIGH (ref 70–99)
Glucose-Capillary: 249 mg/dL — ABNORMAL HIGH (ref 70–99)
Glucose-Capillary: 133 mg/dL — ABNORMAL HIGH (ref 70–99)
Glucose-Capillary: 229 mg/dL — ABNORMAL HIGH (ref 70–99)

## 2019-10-04 LAB — PROTIME-INR
INR: 0.9 (ref 0.8–1.2)
Prothrombin Time: 12.1 seconds (ref 11.4–15.2)

## 2019-10-04 LAB — FERRITIN: Ferritin: 228 ng/mL (ref 24–336)

## 2019-10-04 LAB — LACTIC ACID, PLASMA: Lactic Acid, Venous: 2.9 mmol/L (ref 0.5–1.9)

## 2019-10-04 LAB — PHOSPHORUS: Phosphorus: 2.8 mg/dL (ref 2.5–4.6)

## 2019-10-04 LAB — C-REACTIVE PROTEIN: CRP: 0.9 mg/dL (ref <1.0)

## 2019-10-04 LAB — PROCALCITONIN: Procalcitonin: <0.1 ng/mL

## 2019-10-04 MED ORDER — VITAMIN C 500 MG PO TABS
500.0000 mg | ORAL_TABLET | Freq: Two times a day (BID) | ORAL | Status: DC
  Administered 2019-10-04 – 2019-10-05 (×3): 500 mg via ORAL
  Filled 2019-10-04 (×3): qty 1

## 2019-10-04 MED ORDER — FENTANYL CITRATE (PF) 100 MCG/2ML IJ SOLN
INTRAMUSCULAR | Status: AC
  Filled 2019-10-04: qty 4

## 2019-10-04 MED ORDER — NALOXONE HCL 0.4 MG/ML IJ SOLN
INTRAMUSCULAR | Status: AC
  Filled 2019-10-04: qty 1

## 2019-10-04 MED ORDER — MIDAZOLAM HCL 2 MG/2ML IJ SOLN
INTRAMUSCULAR | Status: AC
  Filled 2019-10-04: qty 4

## 2019-10-04 MED ORDER — ZINC SULFATE 220 (50 ZN) MG PO CAPS
220.0000 mg | ORAL_CAPSULE | Freq: Every day | ORAL | Status: DC
  Administered 2019-10-04 – 2019-10-05 (×2): 220 mg via ORAL
  Filled 2019-10-04 (×2): qty 1

## 2019-10-04 MED ORDER — SODIUM CHLORIDE 0.9 % IV SOLN
INTRAVENOUS | Status: AC
  Filled 2019-10-04: qty 250

## 2019-10-04 MED ORDER — FLUMAZENIL 0.5 MG/5ML IV SOLN
INTRAVENOUS | Status: AC
  Filled 2019-10-04: qty 5

## 2019-10-04 NOTE — Progress Notes (Signed)
The patient's case was discussed in brain and spine conference. He has CT imaging that shows disease in the abdomen though the proximity to the aorta was of concern. It was felt that the findings were less likely related to a primary lung diagnosis. Med Onc is planning a plasmacytoma work up as well to rule this process out. Further conference recommendations were to proceed with a CT biopsy of the posterior aspect of the C spine disease since this appeared to be the safest site to biopsy and Dr. Laurence Ferrari is aware of the case and trying to make time for this hopefully today. If this is not possible, the back up plan would be consideration of posterior decompression but would have a higher morbidity risk due to his obesity and technicalities of the procedure and would require significant logistics to coordinate. We will plan to simulate in the next one to two days provided tissue confirms a malignant diagnosis. Lovenox is to be on hold until further decisions are made.    Carola Rhine, PAC

## 2019-10-04 NOTE — Procedures (Signed)
Interventional Radiology Procedure Note  Procedure: CT guided biopsy of soft tissue component of the left C4 mass.   Complications: None immediate  Estimated Blood Loss: None  Recommendations: - Return to room - Bedrest x 1 hr - DC home  Signed,  Criselda Peaches, MD

## 2019-10-04 NOTE — TOC Progression Note (Signed)
Transition of Care [facility]) - Progression Note    Patient Details  Name: Scott Eaton MRN: [id number] Date of Birth: 06/14/1976  [facility] CM/SW Contact  Purcell Mouton, RN Phone Number: 10/04/2019, 3:21 PM  Clinical Narrative:    Pt admitted with cco neck pain, and arm weakness. COVID positive  10/27 from home, plan to discharge home.    Expected Discharge Plan: Home/Self Care Barriers to Discharge: No Barriers Identified  Expected Discharge Plan and Services Expected Discharge Plan: Home/Self Care   Discharge Planning Services: CM Consult   Living arrangements for the past 2 months: Single Family Home                                       Social Determinants of Health (SDOH) Interventions    Readmission Risk Interventions No flowsheet data found.

## 2019-10-04 NOTE — Progress Notes (Addendum)
I reviewed findings and plan with patient this morning, as well as with his wife, by telephone.  I also discussed findings from tumor board.  Plan is for CT-guided biopsy of soft tissue mass to guide further therapy.  This was done this afternoon.  Awaiting results.  Patient stated that his arm tingling is stable.  On review of pathology, Dr. Lisbeth Renshaw and I feel that most effective treatment for this patient will be surgical decompression and stabilization of C 34 metastasis with post-operative more definitive radiation therapy.  Patient agrees with this plan and wishes to proceed.

## 2019-10-04 NOTE — Plan of Care (Signed)

## 2019-10-04 NOTE — Progress Notes (Signed)
Brief Oncology Note:  CT scans and MRI reviewed. Discussed with Medical Oncologist who recommends proceeding with SPEP, quantitative immunoglobulins, and light chains which I have ordered today. Discussed recommendations from tumor board this morning  with Radiation Oncology PA and agree that posterior aspect of C spine disease will likely be the safest area to biopsy. Radiation Oncology has spoken with Radiology and IR regarding this biopsy and will attempt to fit the patient in today. The patient will need urgent radiation once diagnosis established. If biopsy cannot be performed today, may need to transfer to [facility] for posterior decompression.   Will continue to follow chart peripherally and plan for further follow-up with Medical Oncology (likely as outpatient).  Mikey Bussing, DNP, AGPCNP-BC, AOCNP

## 2019-10-04 NOTE — Progress Notes (Signed)
PROGRESS NOTE    Scott Eaton  [id number] DOB: February 26, 1976 DOA: 10/02/2019 PCP: Patient, No Pcp Per    Brief Narrative:  Scott Eaton an unfortunate 43 y.o.malewith medical history significant ofobesity but otherwise healthy PTA. Patient has been having neck pain since a fall down stairs last Xmass. This has progressed to B arm weakness these past 2 weeks. Patient followed by Dr. Vertell Limber. Dr. Vertell Limber obtained CT scan on 23rd and MRI on 24th of C spine which are suggestive of metastatic CA to neck, thecal sac impingement but no cord signal abnormality, primary not known.  Patient sent in to [facility] as a direct admit from Dr. 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  Patient underwent CT chest CT abdomen and pelvis which showed a hypodense mass closely abutting partially encased the left aspect of the aorta at the level of the diaphragmatic crus this although does not appear to arise from the vessel measuring 6.0 x 4.0 x 6.5 cm.  Concern for additional metastatic lesion rather than a primary.  6 mm pulmonary nodule of the lateral segment right middle lobe noted which is nonspecific metastatic disease not excluded.  No other evidence of metastatic disease in the chest abdomen or pelvis.  Hepatic steatosis.  Patient seen by oncology and being followed by neurosurgery as well as radiation oncology.  Patient to undergo CT-guided biopsy of cervical mass for further evaluation.  Continue Decadron.  Per neurosurgery, oncology, radiation oncology.  Follow.  2.  COVID-19 positivity Patient asymptomatic.  Patient denies any chest pain no shortness of breath.  Patient with sats of 98% on room air.  CRP noted to be 1.0 yesterday with repeat this morning at 0.9.  Lactic acid  level trending down.  Procalcitonin is < 0.10.  CT chest negative for any bilateral groundglass opacities or infiltrate.  Patient on Decadron secondary to problem #1 which we will continue for now.  Placed on vitamin C and zinc.  Supportive care.  3.  Leukocytosis Likely secondary to steroids.  Procalcitonin negative.  No need for antibiotics.  Follow.   DVT prophylaxis: SCDs/sun hold in anticipation of probable biopsy Code Status: Full Family Communication: Updated patient.  No family at bedside. Disposition Plan: Likely home once stable and okay with neurosurgery and oncology and radiation oncology.   Consultants:   Neurosurgery: Dr. Vertell Limber  Interventional radiology: Jacqulynn Cadet 10/04/2019  Oncology: Mikey Bussing, NP 10/03/2019  Procedures:   CT chest 10/03/2019  CT abdomen and pelvis 10/03/2019  Antimicrobials:   None   Subjective: Patient sitting up in chair.  Patient still with some neck pain however slightly improved since admission and still with some numbness and tingling in his hands.  Denies any chest pain.  Denies any significant shortness of breath.  Objective: Vitals:   10/04/19 0341 10/04/19 0600 10/04/19 0812 10/04/19 1225  BP: (!) 184/78 (!) 149/79 (!) 154/77 (!) 171/69  Pulse: 90 100 100 (!) 105  Resp: 18 18 16 20   Temp: 98.2 F (36.8 C) 97.9 F (36.6 C) 98.6 F (37 C) 98.1 F (36.7 C)  TempSrc: Oral Oral Oral Oral  SpO2: 98% 98% 97% 98%  Weight:      Height:        Intake/Output Summary (Last 24 hours) at 10/04/2019 1902  Last data filed at 10/04/2019 0517 Gross per 24 hour  Intake 800 ml  Output --  Net 800 ml   Filed Weights   10/02/19 2000  Weight: (!) 152 kg    Examination:  General exam: Appears calm and comfortable  Respiratory system: Clear to auscultation. Respiratory effort normal. Cardiovascular system: S1 & S2 heard, RRR. No JVD, murmurs, rubs, gallops or clicks. No pedal edema. Gastrointestinal system: Abdomen is  nondistended, soft and nontender. No organomegaly or masses felt. Normal bowel sounds heard. Central nervous system: Alert and oriented. No focal neurological deficits. Extremities: Symmetric 5 x 5 power. Skin: No rashes, lesions or ulcers Psychiatry: Judgement and insight appear normal. Mood & affect appropriate.     Data Reviewed: I have personally reviewed following labs and imaging studies  CBC: Recent Labs  Lab 10/03/19 0625 10/04/19 0943  WBC 8.6 19.9*  NEUTROABS  --  17.8*  HGB 14.6 14.9  HCT 44.4 46.2  MCV 89.7 90.9  PLT 315 [id number]   Basic Metabolic Panel: Recent Labs  Lab 10/03/19 0625 10/04/19 0943  NA 137 136  K 5.1 4.5  CL 101 100  CO2 27 23  GLUCOSE 154* 185*  BUN 12 15  CREATININE 0.81 0.76  CALCIUM 10.0 9.9  MG  --  2.1  PHOS  --  2.8   GFR: Estimated Creatinine Clearance: 179.3 mL/min (by C-G formula based on SCr of 0.76 mg/dL). Liver Function Tests: Recent Labs  Lab 10/03/19 0625 10/04/19 0943  AST 26 27  ALT 68* 62*  ALKPHOS 69 58  BILITOT 0.4 0.3  PROT 7.6 7.9  ALBUMIN 4.6 4.8   No results for input(s): LIPASE, AMYLASE in the last 168 hours. No results for input(s): AMMONIA in the last 168 hours. Coagulation Profile: Recent Labs  Lab 10/04/19 1110  INR 0.9   Cardiac Enzymes: No results for input(s): CKTOTAL, CKMB, CKMBINDEX, TROPONINI in the last 168 hours. BNP (last 3 results) No results for input(s): PROBNP in the last 8760 hours. HbA1C: No results for input(s): HGBA1C in the last 72 hours. CBG: Recent Labs  Lab 10/03/19 1720 10/03/19 2117 10/04/19 0808 10/04/19 1221 10/04/19 1635  GLUCAP 204* 208* 159* 249* 133*   Lipid Profile: No results for input(s): CHOL, HDL, LDLCALC, TRIG, CHOLHDL, LDLDIRECT in the last 72 hours. Thyroid Function Tests: No results for input(s): TSH, T4TOTAL, FREET4, T3FREE, THYROIDAB in the last 72 hours. Anemia Panel: Recent Labs    10/03/19 1647 10/04/19 0943  FERRITIN 227 228   Sepsis  Labs: Recent Labs  Lab 10/03/19 1647 10/03/19 1934 10/04/19 0943  PROCALCITON  --   --  <0.10  LATICACIDVEN 3.3* 3.9* 2.9*    Recent Results (from the past 240 hour(s))  SARS CORONAVIRUS 2 (TAT 6-24 HRS) Nasopharyngeal Nasopharyngeal Swab     Status: Abnormal   Collection Time: 10/02/19  8:57 PM   Specimen: Nasopharyngeal Swab  Result Value Ref Range Status   SARS Coronavirus 2 POSITIVE (A) NEGATIVE Final    Comment: RESULT CALLED TO, READ BACK BY AND VERIFIED WITH: Jorge Ny RN 9:30 10/03/19 ([email or web address]) (NOTE) SARS-CoV-2 target nucleic acids are DETECTED. The SARS-CoV-2 RNA is generally detectable in upper and lower respiratory specimens during the acute phase of infection. Positive results are indicative of active infection with SARS-CoV-2. Clinical  correlation with patient history and other diagnostic information is necessary to determine patient infection status. Positive results do  not rule out bacterial infection or co-infection with other viruses. The expected result  is Negative. Fact Sheet for Patients: SugarRoll.be Fact Sheet for Healthcare Providers: https://www.woods-mathews.com/ This test is not yet approved or cleared by the [place] FDA and  has been authorized for detection and/or diagnosis of SARS-CoV-2 by FDA under an Emergency Use Authorization (EUA). This EUA will remain  in effect (meaning this test can be used) for  the duration of the COVID-19 declaration under Section 564(b)(1) of the Act, 21 U.S.C. section 360bbb-3(b)(1), unless the authorization is terminated or revoked sooner. Performed at [facility], [place] [address], [place], [place] [address]          Radiology Studies: Ct Chest W Contrast  Result Date: 10/03/2019 CLINICAL DATA:  Metastatic lesion of the cervical spine, evaluate for primary malignancy EXAM: CT CHEST, ABDOMEN, AND PELVIS WITH CONTRAST TECHNIQUE: Multidetector CT imaging  of the chest, abdomen and pelvis was performed following the standard protocol during bolus administration of intravenous contrast. CONTRAST:  1100mL OMNIPAQUE IOHEXOL 300 MG/ML  SOLN COMPARISON:  None. FINDINGS: CT CHEST FINDINGS Cardiovascular: No significant vascular findings. Normal heart size. No pericardial effusion. Mediastinum/Nodes: No enlarged mediastinal, hilar, or axillary lymph nodes. Thyroid gland, trachea, and esophagus demonstrate no significant findings. Lungs/Pleura: There is a 6 mm pulmonary nodule of the lateral segment right middle lobe (series 6, image 83). No pleural effusion or pneumothorax. Musculoskeletal: No chest wall mass or suspicious bone lesions identified. CT ABDOMEN PELVIS FINDINGS Hepatobiliary: No solid liver abnormality is seen. Hepatic steatosis. No gallstones, gallbladder wall thickening, or biliary dilatation. Pancreas: Unremarkable. No pancreatic ductal dilatation or surrounding inflammatory changes. Spleen: Normal in size without significant abnormality. Adrenals/Urinary Tract: Adrenal glands are unremarkable. Kidneys are normal, without renal calculi, solid lesion, or hydronephrosis. Bladder is unremarkable. Stomach/Bowel: Stomach is within normal limits. Appendix appears normal. No evidence of bowel wall thickening, distention, or inflammatory changes. Vascular/Lymphatic: There is a hypodense mass, which closely abuts and partially encases the left aspect of the aorta at the level of the diaphragmatic cruces, although does not appear to arise from the vessel, measuring 6.0 x 4.0 x 6.5 cm (series 2, image 57, series 5, image 116). No enlarged abdominal or pelvic lymph nodes. Reproductive: No mass or other abnormality. Other: No abdominal wall hernia or abnormality. No abdominopelvic ascites. Musculoskeletal: No acute or significant osseous findings. IMPRESSION: 1. There is a hypodense mass, which closely abuts and partially encases the left aspect of the aorta at the level  of the diaphragmatic cruces, although does not appear to arise from the vessel, measuring 6.0 x 4.0 x 6.5 cm (series 2, image 57, series 5, image 116). Generally favor this to represent an additional metastatic lesion rather than a primary mass although this could reflect an unusual primary soft tissue malignancy. Consider PET/CT to further evaluate for sites of additional metabolically active metastatic or primary disease and assist in planning for tissue diagnosis. 2. There is a 6 mm pulmonary nodule of the lateral segment right middle lobe (series 6, image 83). This nodule is nonspecific and metastatic disease is not excluded. 3. No other evidence of metastatic disease in the chest, abdomen, or pelvis. 4.  Hepatic steatosis. Electronically Signed   By: Eddie Candle M.D.   On: 10/03/2019 17:00   Ct Abdomen Pelvis W Contrast  Result Date: 10/03/2019 CLINICAL DATA:  Metastatic lesion of the cervical spine, evaluate for primary malignancy EXAM: CT CHEST, ABDOMEN, AND PELVIS WITH CONTRAST TECHNIQUE: Multidetector CT imaging of the chest, abdomen and pelvis was performed following the standard protocol during  bolus administration of intravenous contrast. CONTRAST:  118mL OMNIPAQUE IOHEXOL 300 MG/ML  SOLN COMPARISON:  None. FINDINGS: CT CHEST FINDINGS Cardiovascular: No significant vascular findings. Normal heart size. No pericardial effusion. Mediastinum/Nodes: No enlarged mediastinal, hilar, or axillary lymph nodes. Thyroid gland, trachea, and esophagus demonstrate no significant findings. Lungs/Pleura: There is a 6 mm pulmonary nodule of the lateral segment right middle lobe (series 6, image 83). No pleural effusion or pneumothorax. Musculoskeletal: No chest wall mass or suspicious bone lesions identified. CT ABDOMEN PELVIS FINDINGS Hepatobiliary: No solid liver abnormality is seen. Hepatic steatosis. No gallstones, gallbladder wall thickening, or biliary dilatation. Pancreas: Unremarkable. No pancreatic ductal  dilatation or surrounding inflammatory changes. Spleen: Normal in size without significant abnormality. Adrenals/Urinary Tract: Adrenal glands are unremarkable. Kidneys are normal, without renal calculi, solid lesion, or hydronephrosis. Bladder is unremarkable. Stomach/Bowel: Stomach is within normal limits. Appendix appears normal. No evidence of bowel wall thickening, distention, or inflammatory changes. Vascular/Lymphatic: There is a hypodense mass, which closely abuts and partially encases the left aspect of the aorta at the level of the diaphragmatic cruces, although does not appear to arise from the vessel, measuring 6.0 x 4.0 x 6.5 cm (series 2, image 57, series 5, image 116). No enlarged abdominal or pelvic lymph nodes. Reproductive: No mass or other abnormality. Other: No abdominal wall hernia or abnormality. No abdominopelvic ascites. Musculoskeletal: No acute or significant osseous findings. IMPRESSION: 1. There is a hypodense mass, which closely abuts and partially encases the left aspect of the aorta at the level of the diaphragmatic cruces, although does not appear to arise from the vessel, measuring 6.0 x 4.0 x 6.5 cm (series 2, image 57, series 5, image 116). Generally favor this to represent an additional metastatic lesion rather than a primary mass although this could reflect an unusual primary soft tissue malignancy. Consider PET/CT to further evaluate for sites of additional metabolically active metastatic or primary disease and assist in planning for tissue diagnosis. 2. There is a 6 mm pulmonary nodule of the lateral segment right middle lobe (series 6, image 83). This nodule is nonspecific and metastatic disease is not excluded. 3. No other evidence of metastatic disease in the chest, abdomen, or pelvis. 4.  Hepatic steatosis. Electronically Signed   By: Eddie Candle M.D.   On: 10/03/2019 17:00        Scheduled Meds:  dexamethasone  4 mg Oral Q6H   loratadine  10 mg Oral Daily    pantoprazole  40 mg Oral Daily   vitamin C  500 mg Oral BID   zinc sulfate  220 mg Oral Daily   Continuous Infusions:  sodium chloride       LOS: 2 days    Time spent: 35 minutes    Irine Seal, MD [facility]  If 7PM-7AM, please contact night-coverage [email or web address] 10/04/2019, 7:02 PM

## 2019-10-04 NOTE — Consult Note (Signed)
Radiation Oncology         [phone] ________________________________  Name: Scott Eaton        MRN: [id number]  Date of Service: 10/03/2019        DOB: [birth year]  [id number], No Pcp Per  No ref. provider found     REFERRING PHYSICIAN: No ref. provider found   DIAGNOSIS: The encounter diagnosis was Metastatic cancer to spine [facility]).   HISTORY OF PRESENT ILLNESS: Scott Eaton is a 43 y.o. male seen at the request of Dr. Vertell Limber for concern for impending cord compression of the cervical spine.  The patient does not have a known medical history for anything other than obesity and is been treated as an outpatient with phentermine.  He has been quite active league recently trying to lose weight and physically exercising with regularity.  He developed pain in his neck in early to mid March 2020.  He was encouraged to be evaluated by orthopedics, apparently at that time there was concern for a lesion in the bone in his cervical spine the patient had seen multiple providers including neurosurgery orthopedics and chiropractic medicine.  While his symptoms did not improve, he was sent back more recently to neurosurgery, and an MRI scan on the date of his admission revealed concerns for changes much more significant than arthritic related findings and the possibility of a metastatic cancer.  There was also concern for compression of the thecal sac at the C3-C4 region without enhancement of the cord.  Upon admission he was tested for coronavirus and surprisingly was positive.  The patient has been evaluated by oncology and is currently undergoing a work-up.  He is to proceed with a CT scan of the chest abdomen and pelvis to determine where the primary site of his presumed cancer is arising.  We were asked to see the patient to discuss options of urgent radiotherapy.  Of note the patient was started on dexamethasone 4 mg every 6 hours, and per report has been neurologically intact.    PREVIOUS  RADIATION THERAPY: No   PAST MEDICAL HISTORY:  Past Medical History:  Diagnosis Date   Obesity        PAST SURGICAL HISTORY:History reviewed. No pertinent surgical history.   FAMILY HISTORY:  Family History  Problem Relation Age of Onset   Cancer Neg Hx      SOCIAL HISTORY:  reports that he has never smoked. He has never used smokeless tobacco. He reports current alcohol use. He reports that he does not use drugs.  The patient is married and resides in [place] which is in [place].  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  He has a high school aged son who is active in sports.   ALLERGIES: Patient has no known allergies.   MEDICATIONS:  Current Facility-Administered Medications  Medication Dose Route Frequency Provider Last Rate Last Dose   acetaminophen (TYLENOL) tablet 650 mg  650 mg Oral Q6H PRN Etta Quill, DO       Or   acetaminophen (TYLENOL) suppository 650 mg  650 mg Rectal Q6H PRN Etta Quill, DO       cyclobenzaprine (FLEXERIL) tablet 5 mg  5 mg Oral TID PRN Etta Quill, DO       dexamethasone (DECADRON) tablet 4 mg  4 mg Oral Q6H Etta Quill, DO   4  mg at 10/04/19 0654   HYDROcodone-acetaminophen (NORCO/VICODIN) 5-325 MG per tablet 1-2 tablet  1-2 tablet Oral Q6H PRN Etta Quill, DO   2 tablet at 10/03/19 2158   ibuprofen (ADVIL) tablet 600 mg  600 mg Oral Q6H PRN Etta Quill, DO       influenza vac split quadrivalent PF (FLUARIX) injection 0.5 mL  0.5 mL Intramuscular Tomorrow-1000 Alcario Drought, Jared M, DO       iohexol (OMNIPAQUE) 300 MG/ML solution 30 mL  30 mL Oral Once PRN Etta Quill, DO       loratadine (CLARITIN) tablet 10 mg  10 mg Oral Daily Jennette Kettle M, DO   10 mg at 10/03/19 0905   ondansetron (ZOFRAN) tablet 4 mg  4 mg Oral Q6H PRN Etta Quill, DO       Or   ondansetron  [facility]) injection 4 mg  4 mg Intravenous Q6H PRN Etta Quill, DO       pantoprazole (PROTONIX) EC tablet 40 mg  40 mg Oral Daily Jennette Kettle M, DO   40 mg at 10/03/19 [id number]   vitamin C (ASCORBIC ACID) tablet 500 mg  500 mg Oral BID Eugenie Filler, MD       zinc sulfate capsule 220 mg  220 mg Oral Daily Eugenie Filler, MD         REVIEW OF SYSTEMS: I spoke with the patient's wife for our first encounter.  She states that he has had progressive pain in the left neck since March.  Several interventions have been attempted and narcotic pain medicine showed very minimal improvement.  She reports that he has not been complaining of any other significant symptoms, he has intentionally been trying to lose weight taking phentermine and actively exercising.  She states that at least 3-5 times per week they walk more than 3 miles at a local park.  He has not complained of any upper respiratory symptoms or had any known fevers.  No other complaints are verbalized.     PHYSICAL EXAM:  Wt Readings from Last 3 Encounters:  10/02/19 (!) 335 lb (152 kg)   Temp Readings from Last 3 Encounters:  10/04/19 98.6 F (37 C) (Oral)   BP Readings from Last 3 Encounters:  10/04/19 (!) 154/77   Pulse Readings from Last 3 Encounters:  10/04/19 100   Pain Assessment Pain Score: Asleep/10 Unable to assess given encounter type.   ECOG = 1  0 - Asymptomatic (Fully active, able to carry on all predisease activities without restriction)  1 - Symptomatic but completely ambulatory (Restricted in physically strenuous activity but ambulatory and able to carry out work of a light or sedentary nature. For example, light housework, office work)  2 - Symptomatic, <50% in bed during the day (Ambulatory and capable of all self care but unable to carry out any work activities. Up and about more than 50% of waking hours)  3 - Symptomatic, >50% in bed, but not bedbound (Capable of only limited self-care,  confined to bed or chair 50% or more of waking hours)  4 - Bedbound (Completely disabled. Cannot carry on any self-care. Totally confined to bed or chair)  5 - Death   Eustace Pen MM, Creech RH, Tormey DC, et al. [phone]). "Toxicity and response criteria of the [facility]". [place] Oncol. 5 (6): 649-55    LABORATORY DATA:  Lab Results  Component Value Date   WBC 8.6 10/03/2019  HGB 14.6 10/03/2019   HCT 44.4 10/03/2019   MCV 89.7 10/03/2019   PLT 315 10/03/2019   Lab Results  Component Value Date   NA 137 10/03/2019   K 5.1 10/03/2019   CL 101 10/03/2019   CO2 27 10/03/2019   Lab Results  Component Value Date   ALT 68 (H) 10/03/2019   AST 26 10/03/2019   ALKPHOS 69 10/03/2019   BILITOT 0.4 10/03/2019      RADIOGRAPHY: Ct Chest W Contrast  Result Date: 10/03/2019 CLINICAL DATA:  Metastatic lesion of the cervical spine, evaluate for primary malignancy EXAM: CT CHEST, ABDOMEN, AND PELVIS WITH CONTRAST TECHNIQUE: Multidetector CT imaging of the chest, abdomen and pelvis was performed following the standard protocol during bolus administration of intravenous contrast. CONTRAST:  132mL OMNIPAQUE IOHEXOL 300 MG/ML  SOLN COMPARISON:  None. FINDINGS: CT CHEST FINDINGS Cardiovascular: No significant vascular findings. Normal heart size. No pericardial effusion. Mediastinum/Nodes: No enlarged mediastinal, hilar, or axillary lymph nodes. Thyroid gland, trachea, and esophagus demonstrate no significant findings. Lungs/Pleura: There is a 6 mm pulmonary nodule of the lateral segment right middle lobe (series 6, image 83). No pleural effusion or pneumothorax. Musculoskeletal: No chest wall mass or suspicious bone lesions identified. CT ABDOMEN PELVIS FINDINGS Hepatobiliary: No solid liver abnormality is seen. Hepatic steatosis. No gallstones, gallbladder wall thickening, or biliary dilatation. Pancreas: Unremarkable. No pancreatic ductal dilatation or surrounding  inflammatory changes. Spleen: Normal in size without significant abnormality. Adrenals/Urinary Tract: Adrenal glands are unremarkable. Kidneys are normal, without renal calculi, solid lesion, or hydronephrosis. Bladder is unremarkable. Stomach/Bowel: Stomach is within normal limits. Appendix appears normal. No evidence of bowel wall thickening, distention, or inflammatory changes. Vascular/Lymphatic: There is a hypodense mass, which closely abuts and partially encases the left aspect of the aorta at the level of the diaphragmatic cruces, although does not appear to arise from the vessel, measuring 6.0 x 4.0 x 6.5 cm (series 2, image 57, series 5, image 116). No enlarged abdominal or pelvic lymph nodes. Reproductive: No mass or other abnormality. Other: No abdominal wall hernia or abnormality. No abdominopelvic ascites. Musculoskeletal: No acute or significant osseous findings. IMPRESSION: 1. There is a hypodense mass, which closely abuts and partially encases the left aspect of the aorta at the level of the diaphragmatic cruces, although does not appear to arise from the vessel, measuring 6.0 x 4.0 x 6.5 cm (series 2, image 57, series 5, image 116). Generally favor this to represent an additional metastatic lesion rather than a primary mass although this could reflect an unusual primary soft tissue malignancy. Consider PET/CT to further evaluate for sites of additional metabolically active metastatic or primary disease and assist in planning for tissue diagnosis. 2. There is a 6 mm pulmonary nodule of the lateral segment right middle lobe (series 6, image 83). This nodule is nonspecific and metastatic disease is not excluded. 3. No other evidence of metastatic disease in the chest, abdomen, or pelvis. 4.  Hepatic steatosis. Electronically Signed   By: Eddie Candle M.D.   On: 10/03/2019 17:00   Ct Cervical Spine Wo Contrast  Result Date: 09/29/2019 CLINICAL DATA:  Posterior neck pain radiating into both  shoulders. EXAM: CT CERVICAL SPINE WITHOUT CONTRAST TECHNIQUE: Multidetector CT imaging of the cervical spine was performed without intravenous contrast. Multiplanar CT image reconstructions were also generated. COMPARISON:  Radiographs dated 09/13/2019 FINDINGS: Alignment: There is a 3 mm anterolisthesis of C3 on C4. Skull base and vertebrae: There is extensive destruction of left side of  the C4 vertebral body with extensive destruction of the left lateral mass, left pedicle, left lamina, spinous process, and partial destruction of the right lamina. There is a pathologic fracture involving the vertebral body and left lateral mass. Tumor extends into the spinal canal compressing the spinal cord. Soft tissues and spinal canal: The tumor destroying the C4 vertebral body extends into the spinal canal compressing the spinal cord and thecal sac. The tumor extends into the left neural foramen and is immediately adjacent to the left vertebral artery. Disc levels:  C2-3: Normal. C3-4: 3 mm anterolisthesis of C3 on C4. No appreciable disc bulging or foraminal stenosis. However, there is abnormal soft tissue in and lateral to the left neural foramen obscuring the left vertebral artery with abnormal density in the left lateral recess and left neural foramen with abnormal density surrounding the left side of the thecal sac which likely represents there is also abnormal soft tissue surrounding the left lateral mass in the left transverse process and left lamina consistent with tumor extending into the soft tissues. C4-5: Tumor destroys at least 50% of the C4 vertebral body, the left pedicle, left lateral mass, left lamina, spinous processes, left transverse process, and a portion of the right lamina with pathologic fractures of the vertebral body and lateral mass. Tumor extends into the spinal canal severely compressing the thecal sac. Tumor also extends into the paraspinal soft tissues and around the left vertebral artery and  into the left posterolateral spinal musculature at C4. Tumor extends into the left neural foramen. C5-6: Negative. C6-7: Negative. C7-T1: Negative. T1-2 and T2-3: Negative. Upper chest: Normal. Other: None IMPRESSION: 1. 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 2. Tumor extends into the spinal canal and left neural foramina at C4-5 and C3-4. The tumor compresses the thecal sac and spinal cord at C3-4 and C4-5 and behind the C4 vertebral body. 3. Tumor extends into the left lateral recesses and left neural foramina at C3-4 and C4-5 and into the left posterolateral spinal musculature at C3-4 and C4-5. 4. 3 mm anterolisthesis of C3 on C4. at 3:35 pm to Raquel Sarna, Dr. Melven Sartorius assistant, who verbally acknowledged these results. Electronically Signed   By: Lorriane Shire M.D.   On: 09/29/2019 15:51   Mr Cervical Spine W Wo Contrast  Result Date: 10/01/2019 CLINICAL DATA:  Posterior neck pain with recently demonstrated vertebral tumor. EXAM: MRI CERVICAL SPINE WITHOUT AND WITH CONTRAST TECHNIQUE: Multiplanar and multiecho pulse sequences of the cervical spine, to include the craniocervical junction and cervicothoracic junction, were obtained without and with intravenous contrast. CONTRAST:  60mL GADAVIST GADOBUTROL 1 MMOL/ML IV SOLN COMPARISON:  CT cervical spine 09/29/2019 MR cervical spine 02/28/2019 FINDINGS: Alignment: Grade 1 anterolisthesis at C3-4, unchanged. Vertebrae: There is abnormal low T1/high T2-weighted signal within the C4 vertebral body and extending into the left posterior elements. There is associated abnormal contrast enhancement that extends into the adjacent soft tissues. No other focal vertebral lesion. Vertebral body heights are normal. Cord: There is epidural extension of the tumor along the left dorsal aspect of the spinal  canal at C4. Posterior Fossa, vertebral arteries, paraspinal tissues: The left vertebral artery is encased by the abnormally enhancing material. There is a mass within the right carotid space at the C2 level that measures 3.5 x 2.4 cm. Disc levels: C1-C2: Normal. C2-C3:  Normal disc space and facets. No spinal canal or neuroforaminal stenosis. C3-C4: There is extension of the C4 tumor into the left C3-4 neural foramen. There is tumor along the left dorsal epidural space. There is attenuation of the thecal sac. C4-C5: As above, contrast enhancing tumor of the left vertebral body and posterior elements that extends to the left epidural space and the left C3-4 and C4-5 neural foramina. The thecal sac is effaced due to mass effect from the tumor. No signal change within the spinal cord. C5-C6: Normal disc space and facets. No spinal canal or neuroforaminal stenosis. C6-C7: Normal disc space and facets. No spinal canal or neuroforaminal stenosis. C7-T1: Normal disc space and facets. No spinal canal or neuroforaminal stenosis. IMPRESSION: 1. Enhancing tumor of the C4 vertebral body and posterior elements with epidural extension into the left C3-4 and C4-5 neural foramina. Metastatic disease is suspected. 2. Epidural extension on the left dorsal spinal canal causes mass effect on the thecal sac without spinal cord signal change. 3. The left vertebral artery is encased by the C4 mass. 4. Mass within the right carotid space at the C2 level, measuring 3.5 x 2.4 cm. This is concerning for metastatic disease in the context of the known vertebral lesion. Paraganglioma is an alternative possibility. Electronically Signed   By: Ulyses Jarred M.D.   On: 10/01/2019 22:19   Ct Abdomen Pelvis W Contrast  Result Date: 10/03/2019 CLINICAL DATA:  Metastatic lesion of the cervical spine, evaluate for primary malignancy EXAM: CT CHEST, ABDOMEN, AND PELVIS WITH CONTRAST TECHNIQUE: Multidetector CT imaging of the chest, abdomen and  pelvis was performed following the standard protocol during bolus administration of intravenous contrast. CONTRAST:  179mL OMNIPAQUE IOHEXOL 300 MG/ML  SOLN COMPARISON:  None. FINDINGS: CT CHEST FINDINGS Cardiovascular: No significant vascular findings. Normal heart size. No pericardial effusion. Mediastinum/Nodes: No enlarged mediastinal, hilar, or axillary lymph nodes. Thyroid gland, trachea, and esophagus demonstrate no significant findings. Lungs/Pleura: There is a 6 mm pulmonary nodule of the lateral segment right middle lobe (series 6, image 83). No pleural effusion or pneumothorax. Musculoskeletal: No chest wall mass or suspicious bone lesions identified. CT ABDOMEN PELVIS FINDINGS Hepatobiliary: No solid liver abnormality is seen. Hepatic steatosis. No gallstones, gallbladder wall thickening, or biliary dilatation. Pancreas: Unremarkable. No pancreatic ductal dilatation or surrounding inflammatory changes. Spleen: Normal in size without significant abnormality. Adrenals/Urinary Tract: Adrenal glands are unremarkable. Kidneys are normal, without renal calculi, solid lesion, or hydronephrosis. Bladder is unremarkable. Stomach/Bowel: Stomach is within normal limits. Appendix appears normal. No evidence of bowel wall thickening, distention, or inflammatory changes. Vascular/Lymphatic: There is a hypodense mass, which closely abuts and partially encases the left aspect of the aorta at the level of the diaphragmatic cruces, although does not appear to arise from the vessel, measuring 6.0 x 4.0 x 6.5 cm (series 2, image 57, series 5, image 116). No enlarged abdominal or pelvic lymph nodes. Reproductive: No mass or other abnormality. Other: No abdominal wall hernia or abnormality. No abdominopelvic ascites. Musculoskeletal: No acute or significant osseous findings. IMPRESSION: 1. There is a hypodense mass, which closely abuts and partially encases the left aspect of the aorta at the level of the diaphragmatic  cruces, although does not appear to arise from the vessel, measuring 6.0 x 4.0 x 6.5 cm (series 2, image 57, series 5, image 116). Generally favor this to represent an additional metastatic lesion rather than a primary mass although this could reflect an unusual primary soft tissue malignancy. Consider PET/CT to  further evaluate for sites of additional metabolically active metastatic or primary disease and assist in planning for tissue diagnosis. 2. There is a 6 mm pulmonary nodule of the lateral segment right middle lobe (series 6, image 83). This nodule is nonspecific and metastatic disease is not excluded. 3. No other evidence of metastatic disease in the chest, abdomen, or pelvis. 4.  Hepatic steatosis. Electronically Signed   By: Eddie Candle M.D.   On: 10/03/2019 17:00       IMPRESSION/PLAN: 1. 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  We will follow-up with the results of the patient's CT scan which he is currently down in the department undergoing.  His case is going to be discussed tomorrow in brain and spine oncology conference.  The most likely scenario is that we will attempt tissue confirmation with a CT-guided biopsy, and his CT scan will help to determine the safest location to obtain a specimen.  We will follow-up with the results of this, and would anticipate in a palliative course of radiotherapy to the cervical spine.  We do recommend he continue with dexamethasone 4 mg every 6 hours and continue with neurologic checks regularly as well.  If the patient were to become neurologically compromised, please call the on-call provider through radiation oncology. 2. SARS 2/coronavirus.  The patient is currently in isolation with respiratory and droplet precautions.  This is being heavily considered as a part of the logistics related to how we would safely treat this patient.  Currently he is asymptomatic, and hopefully will remain that way.  Further contact tracing is being done by  family.  Fortunately his son and wife have also gone and had testing that was negative.   In a visit lasting 70 minutes, greater than 50% of the time was spent by phone speaking with the patient's wife, and in floor time, coordinating the patient's care.    Carola Rhine, PAC

## 2019-10-04 NOTE — H&P (View-Only) (Signed)
I reviewed findings and plan with patient this morning, as well as with his wife, by telephone.  I also discussed findings from tumor board.  Plan is for CT-guided biopsy of soft tissue mass to guide further therapy.  This was done this afternoon.  Awaiting results.  Patient stated that his arm tingling is stable.  On review of pathology, Dr. Lisbeth Renshaw and I feel that most effective treatment for this patient will be surgical decompression and stabilization of C 34 metastasis with post-operative more definitive radiation therapy.  Patient agrees with this plan and wishes to proceed.

## 2019-10-04 NOTE — Progress Notes (Signed)
I spent about an hour with the patient and his wife and son via doximity video call. We were able to recap the current situation including review of his radiology films, results from the imaging, and steps we are taking with the CT biopsy of the neck to obtain a diagnosis. The working diagnosis is that this is a malignant process in the cervical spine. It is unclear as to a primary site based on imaging, but he does appear to have disease in the retroperitoneum encasing the abdominal aorta. We have asked pathology to rush his specimen. He does not have any symptoms of unintended weight loss, but has lost about 40# intentionally. He denies any GI or GU dysfunction, new skin lesions, trouble with breathing, fatigue, or unexplained fevers. He reports his mother had a straight forward skin cancer and that a small resection was performed but he does not think it was aggressive.   We reviewed how Covid plays a part in all of this. We will plan to simulate the cervical spine on Friday evening. He is neurologically intact so we are hoping to start his treatment on Monday 10/09/2019. He is really hopeful to get out of the hospital to cast his vote. Regarding his covid he is asymptomatic. We did discuss that in some cases patients can become acutely ill even if they were doing well prior. His goals for his cancer and covid care are to remain aggressive. While I don't think we need to discuss decision making about resuscitation, he is aware that if he became acutely ill that we would need to seriously review these options. For now we will continue with the plans for active treatment. I also let him know that the hospitalists will determine when he can go home. However if he had any neurologic decline I think he should remain inpatient until a few treatments can be given with radiation. Otherwise I will follow up with Dr. Grandville Silos tomorrow.      Carola Rhine, PAC

## 2019-10-04 NOTE — Consult Note (Signed)
Chief Complaint:  neck pain, bilateral arm  weakness   Referring Physician(s): Curcio,K,NP/ Perkins,A PA  Supervising Physician: Jacqulynn Cadet  Patient Status: [facility] - In-pt  History of Present Illness: Scott Eaton is a 43 y.o. male with past medical history of obesity who was admitted to [facility] on 10/26 secondary to neck pain and progressive bilateral upper extremity weakness.  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  Patient also noted to be COVID-19 positive.  Request now received for image guided biopsy of the posterior cervical spine/ST mass.   Past Medical History:  Diagnosis Date   Obesity     History reviewed. No pertinent surgical history.  Allergies: Patient has no known allergies.  Medications: Prior to Admission medications   Medication Sig Start Date End Date Taking? Authorizing Provider  acetaminophen (TYLENOL) 500 MG tablet Take 1,000 mg by mouth every 6 (six) hours as needed for moderate pain.   Yes [provider]  cyclobenzaprine (FLEXERIL) 10 MG tablet Take 10 mg by mouth 3 (three) times daily as needed for muscle spasms.   Yes [provider]  fexofenadine (ALLEGRA) 180 MG tablet Take 180 mg by mouth daily.   Yes [provider]  HYDROcodone-acetaminophen (NORCO/VICODIN) 5-325 MG tablet Take 1-2 tablets by mouth every 6 (six) hours as needed for moderate pain.   Yes [provider]  ibuprofen (ADVIL) 200 MG tablet Take 600 mg by mouth every 6 (six) hours as needed for moderate pain.    Yes [provider]  phentermine (ADIPEX-P) 37.5 MG tablet Take 37.5 mg by mouth daily. 08/28/19  Yes [provider]     Family History  Problem Relation Age of Onset   Cancer Neg Hx     Social History   Socioeconomic History   Marital status: Married    Spouse name: Not on file   Number of children: Not on file   Years of education: Not on file   Highest education level: Not on file  Occupational History   Not on file  Social Needs   Financial resource strain: Not on file   Food insecurity    Worry: Not on file    Inability: Not on file   Transportation needs    Medical: Not on file    Non-medical: Not on file  Tobacco Use   Smoking status: Never Smoker   Smokeless tobacco: Never Used  Substance and Sexual Activity   Alcohol use: Yes    Comment: occ   Drug use: Never   Sexual activity: Not on file  Lifestyle   Physical activity    Days per week: Not on file    Minutes per session: Not on file   Stress: Not on file  Relationships   Social connections    Talks on phone: Not on file    Gets together: Not on file    Attends religious service: Not on file    Active member of club or organization: Not on file    Attends meetings of clubs or organizations: Not on file    Relationship status: Not  on file  Other Topics Concern   Not on file  Social History Narrative   Not on file      Review of Systems see above; patient currently denies fever, headache, chest pain, dyspnea, cough, abdominal pain, back pain, nausea, vomiting or bleeding  Vital Signs: BP (!) 171/69 (BP Location: Left Arm)    Pulse (!) 105    Temp 98.1 F (36.7 C) (Oral)    Resp 20    Ht 6\' 1"  (1.854 m)    Wt (!) 335 lb (152 kg) Comment: weight from MD office today.   SpO2 98%    BMI 44.20 kg/m   Physical Exam (latest from primary service)patient awake, alert.  Chest clear to auscultation bilaterally.  Heart with regular rate/ rhythm.  Abdomen obese,  soft,  nontender.  Imaging: Ct Chest W Contrast  Result Date: 10/03/2019 CLINICAL DATA:  Metastatic lesion of the cervical spine, evaluate for primary malignancy EXAM: CT CHEST, ABDOMEN, AND PELVIS WITH CONTRAST TECHNIQUE: Multidetector CT imaging of the chest, abdomen and pelvis was performed following the standard protocol during bolus administration of intravenous contrast. CONTRAST:  140mL OMNIPAQUE IOHEXOL 300 MG/ML  SOLN COMPARISON:  None. FINDINGS: CT CHEST FINDINGS Cardiovascular: No significant vascular findings. Normal heart size. No pericardial effusion. Mediastinum/Nodes: No enlarged mediastinal, hilar, or axillary lymph nodes. Thyroid gland, trachea, and esophagus demonstrate no significant findings. Lungs/Pleura: There is a 6 mm pulmonary nodule of the lateral segment right middle lobe (series 6, image 83). No pleural effusion or pneumothorax. Musculoskeletal: No chest wall mass or suspicious bone lesions identified. CT ABDOMEN PELVIS FINDINGS Hepatobiliary: No solid liver abnormality is seen. Hepatic steatosis. No gallstones, gallbladder wall thickening, or biliary dilatation. Pancreas: Unremarkable. No pancreatic ductal dilatation or surrounding inflammatory changes. Spleen: Normal in size without significant abnormality. Adrenals/Urinary Tract: Adrenal glands are unremarkable. Kidneys are normal, without renal calculi, solid lesion, or hydronephrosis. Bladder is unremarkable. Stomach/Bowel: Stomach is within normal limits. Appendix appears normal. No evidence of bowel wall thickening, distention, or inflammatory changes. Vascular/Lymphatic: There is a hypodense mass, which closely abuts and partially encases the left aspect of the aorta at the level of the diaphragmatic cruces, although does not appear to arise from the vessel, measuring 6.0 x 4.0 x 6.5 cm (series 2, image 57, series 5, image 116). No enlarged abdominal or pelvic lymph nodes. Reproductive: No mass or other abnormality. Other: No  abdominal wall hernia or abnormality. No abdominopelvic ascites. Musculoskeletal: No acute or significant osseous findings. IMPRESSION: 1. There is a hypodense mass, which closely abuts and partially encases the left aspect of the aorta at the level of the diaphragmatic cruces, although does not appear to arise from the vessel, measuring 6.0 x 4.0 x 6.5 cm (series 2, image 57, series 5, image 116). Generally favor this to represent an additional metastatic lesion rather than a primary mass although this could reflect an unusual primary soft tissue malignancy. Consider PET/CT to further evaluate for sites of additional metabolically active metastatic or primary disease and assist in planning for tissue diagnosis. 2. There is a 6 mm pulmonary nodule of the lateral segment right middle lobe (series 6, image 83). This nodule is nonspecific and metastatic disease is not excluded. 3. No other evidence of metastatic disease in the chest, abdomen, or pelvis. 4.  Hepatic steatosis. Electronically Signed   By: Eddie Candle M.D.   On: 10/03/2019 17:00   Ct Cervical Spine Wo Contrast  Result Date: 09/29/2019 CLINICAL DATA:  Posterior neck pain  radiating into both shoulders. EXAM: CT CERVICAL SPINE WITHOUT CONTRAST TECHNIQUE: Multidetector CT imaging of the cervical spine was performed without intravenous contrast. Multiplanar CT image reconstructions were also generated. COMPARISON:  Radiographs dated 09/13/2019 FINDINGS: Alignment: There is a 3 mm anterolisthesis of C3 on C4. Skull base and vertebrae: There is extensive destruction of left side of the C4 vertebral body with extensive destruction of the left lateral mass, left pedicle, left lamina, spinous process, and partial destruction of the right lamina. There is a pathologic fracture involving the vertebral body and left lateral mass. Tumor extends into the spinal canal compressing the spinal cord. Soft tissues and spinal canal: The tumor destroying the C4 vertebral  body extends into the spinal canal compressing the spinal cord and thecal sac. The tumor extends into the left neural foramen and is immediately adjacent to the left vertebral artery. Disc levels:  C2-3: Normal. C3-4: 3 mm anterolisthesis of C3 on C4. No appreciable disc bulging or foraminal stenosis. However, there is abnormal soft tissue in and lateral to the left neural foramen obscuring the left vertebral artery with abnormal density in the left lateral recess and left neural foramen with abnormal density surrounding the left side of the thecal sac which likely represents there is also abnormal soft tissue surrounding the left lateral mass in the left transverse process and left lamina consistent with tumor extending into the soft tissues. C4-5: Tumor destroys at least 50% of the C4 vertebral body, the left pedicle, left lateral mass, left lamina, spinous processes, left transverse process, and a portion of the right lamina with pathologic fractures of the vertebral body and lateral mass. Tumor extends into the spinal canal severely compressing the thecal sac. Tumor also extends into the paraspinal soft tissues and around the left vertebral artery and into the left posterolateral spinal musculature at C4. Tumor extends into the left neural foramen. C5-6: Negative. C6-7: Negative. C7-T1: Negative. T1-2 and T2-3: Negative. Upper chest: Normal. Other: None IMPRESSION: 1. 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 2. Tumor extends into the spinal canal and left neural foramina at C4-5 and C3-4. The tumor compresses the thecal sac and spinal cord at C3-4 and C4-5 and behind the C4 vertebral body. 3. Tumor extends into the left lateral recesses and left neural foramina at C3-4 and C4-5 and into the left posterolateral spinal musculature at C3-4 and  C4-5. 4. 3 mm anterolisthesis of C3 on C4. at 3:35 pm to Raquel Sarna, Dr. Melven Sartorius assistant, who verbally acknowledged these results. Electronically Signed   By: Lorriane Shire M.D.   On: 09/29/2019 15:51   Mr Cervical Spine W Wo Contrast  Result Date: 10/01/2019 CLINICAL DATA:  Posterior neck pain with recently demonstrated vertebral tumor. EXAM: MRI CERVICAL SPINE WITHOUT AND WITH CONTRAST TECHNIQUE: Multiplanar and multiecho pulse sequences of the cervical spine, to include the craniocervical junction and cervicothoracic junction, were obtained without and with intravenous contrast. CONTRAST:  28mL GADAVIST GADOBUTROL 1 MMOL/ML IV SOLN COMPARISON:  CT cervical spine 09/29/2019 MR cervical spine 02/28/2019 FINDINGS: Alignment: Grade 1 anterolisthesis at C3-4, unchanged. Vertebrae: There is abnormal low T1/high T2-weighted signal within the C4 vertebral body and extending into the left posterior elements. There is associated abnormal contrast enhancement that extends into the adjacent soft tissues. No other focal vertebral lesion. Vertebral body heights are normal. Cord:  There is epidural extension of the tumor along the left dorsal aspect of the spinal canal at C4. Posterior Fossa, vertebral arteries, paraspinal tissues: The left vertebral artery is encased by the abnormally enhancing material. There is a mass within the right carotid space at the C2 level that measures 3.5 x 2.4 cm. Disc levels: C1-C2: Normal. C2-C3: Normal disc space and facets. No spinal canal or neuroforaminal stenosis. C3-C4: There is extension of the C4 tumor into the left C3-4 neural foramen. There is tumor along the left dorsal epidural space. There is attenuation of the thecal sac. C4-C5: As above, contrast enhancing tumor of the left vertebral body and posterior elements that extends to the left epidural space and the left C3-4 and C4-5 neural foramina. The thecal sac is effaced due to mass effect from the tumor. No signal change within  the spinal cord. C5-C6: Normal disc space and facets. No spinal canal or neuroforaminal stenosis. C6-C7: Normal disc space and facets. No spinal canal or neuroforaminal stenosis. C7-T1: Normal disc space and facets. No spinal canal or neuroforaminal stenosis. IMPRESSION: 1. Enhancing tumor of the C4 vertebral body and posterior elements with epidural extension into the left C3-4 and C4-5 neural foramina. Metastatic disease is suspected. 2. Epidural extension on the left dorsal spinal canal causes mass effect on the thecal sac without spinal cord signal change. 3. The left vertebral artery is encased by the C4 mass. 4. Mass within the right carotid space at the C2 level, measuring 3.5 x 2.4 cm. This is concerning for metastatic disease in the context of the known vertebral lesion. Paraganglioma is an alternative possibility. Electronically Signed   By: Ulyses Jarred M.D.   On: 10/01/2019 22:19   Ct Abdomen Pelvis W Contrast  Result Date: 10/03/2019 CLINICAL DATA:  Metastatic lesion of the cervical spine, evaluate for primary malignancy EXAM: CT CHEST, ABDOMEN, AND PELVIS WITH CONTRAST TECHNIQUE: Multidetector CT imaging of the chest, abdomen and pelvis was performed following the standard protocol during bolus administration of intravenous contrast. CONTRAST:  142mL OMNIPAQUE IOHEXOL 300 MG/ML  SOLN COMPARISON:  None. FINDINGS: CT CHEST FINDINGS Cardiovascular: No significant vascular findings. Normal heart size. No pericardial effusion. Mediastinum/Nodes: No enlarged mediastinal, hilar, or axillary lymph nodes. Thyroid gland, trachea, and esophagus demonstrate no significant findings. Lungs/Pleura: There is a 6 mm pulmonary nodule of the lateral segment right middle lobe (series 6, image 83). No pleural effusion or pneumothorax. Musculoskeletal: No chest wall mass or suspicious bone lesions identified. CT ABDOMEN PELVIS FINDINGS Hepatobiliary: No solid liver abnormality is seen. Hepatic steatosis. No  gallstones, gallbladder wall thickening, or biliary dilatation. Pancreas: Unremarkable. No pancreatic ductal dilatation or surrounding inflammatory changes. Spleen: Normal in size without significant abnormality. Adrenals/Urinary Tract: Adrenal glands are unremarkable. Kidneys are normal, without renal calculi, solid lesion, or hydronephrosis. Bladder is unremarkable. Stomach/Bowel: Stomach is within normal limits. Appendix appears normal. No evidence of bowel wall thickening, distention, or inflammatory changes. Vascular/Lymphatic: There is a hypodense mass, which closely abuts and partially encases the left aspect of the aorta at the level of the diaphragmatic cruces, although does not appear to arise from the vessel, measuring 6.0 x 4.0 x 6.5 cm (series 2, image 57, series 5, image 116). No enlarged abdominal or pelvic lymph nodes. Reproductive: No mass or other abnormality. Other: No abdominal wall hernia or abnormality. No abdominopelvic ascites. Musculoskeletal: No acute or significant osseous findings. IMPRESSION: 1. There is a hypodense mass, which closely abuts and partially encases the left aspect of the  aorta at the level of the diaphragmatic cruces, although does not appear to arise from the vessel, measuring 6.0 x 4.0 x 6.5 cm (series 2, image 57, series 5, image 116). Generally favor this to represent an additional metastatic lesion rather than a primary mass although this could reflect an unusual primary soft tissue malignancy. Consider PET/CT to further evaluate for sites of additional metabolically active metastatic or primary disease and assist in planning for tissue diagnosis. 2. There is a 6 mm pulmonary nodule of the lateral segment right middle lobe (series 6, image 83). This nodule is nonspecific and metastatic disease is not excluded. 3. No other evidence of metastatic disease in the chest, abdomen, or pelvis. 4.  Hepatic steatosis. Electronically Signed   By: Eddie Candle M.D.   On:  10/03/2019 17:00    Labs:  CBC: Recent Labs    10/03/19 0625 10/04/19 0943  WBC 8.6 19.9*  HGB 14.6 14.9  HCT 44.4 46.2  PLT 315 378    COAGS: Recent Labs    10/04/19 1110  INR 0.9    BMP: Recent Labs    10/03/19 0625 10/04/19 0943  NA 137 136  K 5.1 4.5  CL 101 100  CO2 27 23  GLUCOSE 154* 185*  BUN 12 15  CALCIUM 10.0 9.9  CREATININE 0.81 0.76  GFRNONAA >60 >60  GFRAA >60 >60    LIVER FUNCTION TESTS: Recent Labs    10/03/19 0625 10/04/19 0943  BILITOT 0.4 0.3  AST 26 27  ALT 68* 62*  ALKPHOS 69 58  PROT 7.6 7.9  ALBUMIN 4.6 4.8    TUMOR MARKERS: No results for input(s): AFPTM, CEA, CA199, CHROMGRNA in the last 8760 hours.  Assessment and Plan: 43 y.o. male with past medical history of obesity who was admitted to [facility] on 10/26 secondary to neck pain and progressive bilateral upper extremity weakness.  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  Patient also noted to be COVID-19 positive.  Request now received for image guided biopsy of the posterior cervical spine/ST mass.  Imaging studies reviewed by Dr. Laurence Ferrari.Risks and benefits of procedure was discussed with the patient  including, but not limited to bleeding, infection, damage to adjacent structures or low yield requiring additional tests.  All of the questions were answered and there is agreement to proceed.  Consent signed and in chart.  Procedure planned for later today   Thank you for this interesting consult.  I greatly enjoyed talking with Regenia Skeeter and look forward to participating in their care.  A copy of this report was sent to the requesting provider on this date.  Electronically Signed: D. Rowe Robert, PA-C 10/04/2019, 1:34 PM

## 2019-10-05 DIAGNOSIS — R030 Elevated blood-pressure reading, without diagnosis of hypertension: Secondary | ICD-10-CM | POA: Clinically undetermined

## 2019-10-05 LAB — CBC WITH DIFFERENTIAL/PLATELET
Abs Immature Granulocytes: 0.21 10*3/uL — ABNORMAL HIGH (ref 0.00–0.07)
Basophils Absolute: 0 10*3/uL (ref 0.0–0.1)
Basophils Relative: 0 %
Eosinophils Absolute: 0 10*3/uL (ref 0.0–0.5)
Eosinophils Relative: 0 %
HCT: 43.2 % (ref 39.0–52.0)
Hemoglobin: 14.2 g/dL (ref 13.0–17.0)
Immature Granulocytes: 1 %
Lymphocytes Relative: 7 %
Lymphs Abs: 1.5 10*3/uL (ref 0.7–4.0)
MCH: 29.4 pg (ref 26.0–34.0)
MCHC: 32.9 g/dL (ref 30.0–36.0)
MCV: 89.4 fL (ref 80.0–100.0)
Monocytes Absolute: 1.8 10*3/uL — ABNORMAL HIGH (ref 0.1–1.0)
Monocytes Relative: 8 %
Neutro Abs: 19.9 10*3/uL — ABNORMAL HIGH (ref 1.7–7.7)
Neutrophils Relative %: 84 %
Platelets: 370 10*3/uL (ref 150–400)
RBC: 4.83 MIL/uL (ref 4.22–5.81)
RDW: 13.1 % (ref 11.5–15.5)
WBC: 23.5 10*3/uL — ABNORMAL HIGH (ref 4.0–10.5)
nRBC: 0 % (ref 0.0–0.2)

## 2019-10-05 LAB — COMPREHENSIVE METABOLIC PANEL
ALT: 58 U/L — ABNORMAL HIGH (ref 0–44)
AST: 23 U/L (ref 15–41)
Albumin: 4.5 g/dL (ref 3.5–5.0)
Alkaline Phosphatase: 62 U/L (ref 38–126)
Anion gap: 11 (ref 5–15)
BUN: 18 mg/dL (ref 6–20)
CO2: 24 mmol/L (ref 22–32)
Calcium: 9.8 mg/dL (ref 8.9–10.3)
Chloride: 100 mmol/L (ref 98–111)
Creatinine, Ser: 0.74 mg/dL (ref 0.61–1.24)
GFR calc Af Amer: >60 mL/min (ref >60)
GFR calc non Af Amer: >60 mL/min (ref >60)
Glucose, Bld: 162 mg/dL — ABNORMAL HIGH (ref 70–99)
Potassium: 4.7 mmol/L (ref 3.5–5.1)
Sodium: 135 mmol/L (ref 135–145)
Total Bilirubin: 0.5 mg/dL (ref 0.3–1.2)
Total Protein: 7.6 g/dL (ref 6.5–8.1)

## 2019-10-05 LAB — KAPPA/LAMBDA LIGHT CHAINS
Kappa free light chain: 7.9 mg/L (ref 3.3–19.4)
Kappa, lambda light chain ratio: 0.63 (ref 0.26–1.65)
Lambda free light chains: 12.6 mg/L (ref 5.7–26.3)

## 2019-10-05 LAB — GLUCOSE, CAPILLARY
Glucose-Capillary: 170 mg/dL — ABNORMAL HIGH (ref 70–99)
Glucose-Capillary: 196 mg/dL — ABNORMAL HIGH (ref 70–99)

## 2019-10-05 LAB — PHOSPHORUS: Phosphorus: 2.8 mg/dL (ref 2.5–4.6)

## 2019-10-05 LAB — C-REACTIVE PROTEIN: CRP: <0.8 mg/dL (ref <1.0)

## 2019-10-05 LAB — MAGNESIUM: Magnesium: 2.2 mg/dL (ref 1.7–2.4)

## 2019-10-05 LAB — FERRITIN: Ferritin: 211 ng/mL (ref 24–336)

## 2019-10-05 LAB — PROCALCITONIN: Procalcitonin: <0.1 ng/mL

## 2019-10-05 MED ORDER — ASCORBIC ACID 500 MG PO TABS: 500.0000 mg | ORAL_TABLET | Freq: Two times a day (BID) | ORAL | Status: DC

## 2019-10-05 MED ORDER — ZINC SULFATE 220 (50 ZN) MG PO CAPS: 220.0000 mg | ORAL_CAPSULE | Freq: Every day | ORAL | Status: DC

## 2019-10-05 MED ORDER — AMLODIPINE BESYLATE 5 MG PO TABS: 5.0000 mg | ORAL_TABLET | Freq: Every day | ORAL | 1 refills | Status: DC

## 2019-10-05 MED ORDER — HYDROCODONE-ACETAMINOPHEN 5-325 MG PO TABS: 1.0000 | ORAL_TABLET | Freq: Four times a day (QID) | ORAL | 0 refills | Status: DC | PRN

## 2019-10-05 MED ORDER — ENOXAPARIN SODIUM 80 MG/0.8ML ~~LOC~~ SOLN
80.0000 mg | Freq: Every day | SUBCUTANEOUS | Status: DC
  Administered 2019-10-05: 11:00:00 80 mg via SUBCUTANEOUS
  Filled 2019-10-05: qty 0.8

## 2019-10-05 MED ORDER — AMLODIPINE BESYLATE 5 MG PO TABS
5.0000 mg | ORAL_TABLET | Freq: Every day | ORAL | Status: DC
  Administered 2019-10-05: 5 mg via ORAL
  Filled 2019-10-05: qty 1

## 2019-10-05 MED ORDER — CYCLOBENZAPRINE HCL 10 MG PO TABS: 10.0000 mg | ORAL_TABLET | Freq: Three times a day (TID) | ORAL | 0 refills | Status: DC | PRN

## 2019-10-05 MED ORDER — DEXAMETHASONE 4 MG PO TABS: 4.0000 mg | ORAL_TABLET | Freq: Three times a day (TID) | ORAL | 0 refills | Status: DC

## 2019-10-05 MED ORDER — PANTOPRAZOLE SODIUM 40 MG PO TBEC: 40.0000 mg | DELAYED_RELEASE_TABLET | Freq: Every day | ORAL | 1 refills | Status: DC

## 2019-10-05 NOTE — Discharge Summary (Signed)
Physician Discharge Summary  Scott Eaton [id number] DOB: [birth year] DOA: 10/02/2019  PCP: Patient, No Pcp Per  Admit date: 10/02/2019 Discharge date: 10/05/2019  Time spent: 60 minutes  Recommendations for Outpatient Follow-up:  1. Follow-up with Dr. Lisbeth Renshaw, hematology oncology on 10/06/2019.   Discharge Diagnoses:  Principal Problem:   Metastatic cancer to spine [facility]) Active Problems:   Leukocytosis   COVID-19 virus infection   Elevated BP without diagnosis of hypertension   Discharge Condition: Stable.  Diet recommendation: Regular  Filed Weights   10/02/19 2000  Weight: (!) 152 kg    History of present illness:  HPI per Dr Aram Candela is a 43 y.o. male with medical history significant of obesity but otherwise healthy.  Patient has been having neck pain since a fall down stairs last [facility].  This has progressed to B arm weakness these past 2 weeks.  Patient followed by Dr. Vertell Limber.  Dr. Vertell Limber obtained CT scan on 23rd and MRI on 24th of C spine which are suggestive of metastatic CA to neck, thecal sac impingement but no cord signal abnormality, primary not known.  Patient sent in to [facility] as a direct admit from Dr. Melven Sartorius office.  Hospital Course:  1 metastatic cancer to the spine with cord impingement mostly preserved neurologic function Unknown primary.  Patient underwent CT chest CT abdomen and pelvis which showed a hypodense mass closely abutting partially encased the left aspect of the aorta at the level of the diaphragmatic crus this although does not appear to arise from the vessel measuring 6.0 x 4.0 x 6.5 cm.  Concern for additional metastatic lesion rather than a primary.  6 mm pulmonary nodule of the lateral segment right middle lobe noted which is nonspecific metastatic disease not excluded.  No other evidence of metastatic disease in the chest abdomen or pelvis.  Hepatic steatosis.  Patient seen by oncology and being followed by neurosurgery as well  as radiation oncology.  Patient subsequently underwent CT-guided biopsy of cervical mass by IR for further evaluation.  Patient maintained on Decadron.  Patient improved somewhat clinically.  Patient will be discharged home and will follow up with radiation oncology in the outpatient setting.  Radiation oncology will arrange further follow-up with oncology and neurosurgery.  Patient be discharged in stable condition.   2.  COVID-19 positivity Patient asymptomatic.  Patient denied any chest pain, no shortness of breath.  Patient with sats of 98% on room air.  CRP noted to be 1.0 yesterday with repeat this morning at 0.9.  Lactic acid level trending down.  Procalcitonin is < 0.10.  CT chest negative for any bilateral groundglass opacities or infiltrate.  Patient was placed on Decadron secondary to problem #1 as well as vitamin C and zinc.  Patient also maintained on supportive care.  Patient was not hypoxic.  Patient will be discharged on Decadron 4 mg 3 times daily secondary to problem #1 and per radiation oncology recommendations.  Patient has been advised to quarantine for 3 weeks from date of COVID-19 positivity.  3.  Leukocytosis Likely secondary to steroids.  Procalcitonin negative.  No need for antibiotics.    Procedures:  CT chest 10/03/2019  CT abdomen and pelvis 10/03/2019  Consultations:  Neurosurgery: Dr. Vertell Limber  Interventional radiology: Jacqulynn Cadet 10/04/2019  Oncology: Mikey Bussing, NP 10/03/2019  Discharge Exam: Vitals:   10/05/19 0513 10/05/19 1211  BP: (!) 168/73 (!) 156/76  Pulse: 88 95  Resp: 20 18  Temp: 97.8 F (36.6 C) 98.2 F (  36.8 C)  SpO2: 99% 97%    General: NAD Cardiovascular: RRR Respiratory: CTAB  Discharge Instructions   Discharge Instructions    Diet - low sodium heart healthy   Complete by: As directed    Discharge instructions   Complete by: As directed    ?   Person Under Monitoring Name: Scott Eaton  Location: [place] [address]   Infection Prevention Recommendations for Individuals Confirmed to have, or Being Evaluated for, 2019 Novel Coronavirus (COVID-19) Infection Who Receive Care at Home  Individuals who are confirmed to have, or are being evaluated for, COVID-19 should follow the prevention steps below until a healthcare provider or local or state health department says they can return to normal activities.  Stay home except to get medical care You should restrict activities outside your home, except for getting medical care. Do not go to work, school, or public areas, and do not use public transportation or taxis.  Call ahead before visiting your doctor Before your medical appointment, call the healthcare provider and tell them that you have, or are being evaluated for, COVID-19 infection. This will help the healthcare provider's office take steps to keep other people from getting infected. Ask your healthcare provider to call the local or state health department.  Monitor your symptoms Seek prompt medical attention if your illness is worsening (e.g., difficulty breathing). Before going to your medical appointment, call the healthcare provider and tell them that you have, or are being evaluated for, COVID-19 infection. Ask your healthcare provider to call the local or state health department.  Wear a facemask You should wear a facemask that covers your nose and mouth when you are in the same room with other people and when you visit a healthcare provider. People who live with or visit you should also wear a facemask while they are in the same room with you.  Separate yourself from other people in your home As much as possible, you should stay in a different room from other people in your home. Also, you should use a separate bathroom, if available.  Avoid sharing household items You should not share dishes, drinking glasses, cups, eating utensils, towels, bedding,  or other items with other people in your home. After using these items, you should wash them thoroughly with soap and water.  Cover your coughs and sneezes Cover your mouth and nose with a tissue when you cough or sneeze, or you can cough or sneeze into your sleeve. Throw used tissues in a lined trash can, and immediately wash your hands with soap and water for at least 20 seconds or use an alcohol-based hand rub.  Wash your [facility] your hands often and thoroughly with soap and water for at least 20 seconds. You can use an alcohol-based hand sanitizer if soap and water are not available and if your hands are not visibly dirty. Avoid touching your eyes, nose, and mouth with unwashed hands.   Prevention Steps for Caregivers and Household Members of Individuals Confirmed to have, or Being Evaluated for, COVID-19 Infection Being Cared for in the Home  If you live with, or provide care at home for, a person confirmed to have, or being evaluated for, COVID-19 infection please follow these guidelines to prevent infection:  Follow healthcare provider's instructions Make sure that you understand and can help the patient follow any healthcare provider instructions for all care.  Provide for the patient's basic needs You should help the patient with basic  needs in the home and provide support for getting groceries, prescriptions, and other personal needs.  Monitor the patient's symptoms If they are getting sicker, call his or her medical provider and tell them that the patient has, or is being evaluated for, COVID-19 infection. This will help the healthcare provider's office take steps to keep other people from getting infected. Ask the healthcare provider to call the local or state health department.  Limit the number of people who have contact with the patient If possible, have only one caregiver for the patient. Other household members should stay in another home or place of residence.  If this is not possible, they should stay in another room, or be separated from the patient as much as possible. Use a separate bathroom, if available. Restrict visitors who do not have an essential need to be in the home.  Keep older adults, very young children, and other sick people away from the patient Keep older adults, very young children, and those who have compromised immune systems or chronic health conditions away from the patient. This includes people with chronic heart, lung, or kidney conditions, diabetes, and cancer.  Ensure good ventilation Make sure that shared spaces in the home have good air flow, such as from an air conditioner or an opened window, weather permitting.  Wash your hands often Wash your hands often and thoroughly with soap and water for at least 20 seconds. You can use an alcohol based hand sanitizer if soap and water are not available and if your hands are not visibly dirty. Avoid touching your eyes, nose, and mouth with unwashed hands. Use disposable paper towels to dry your hands. If not available, use dedicated cloth towels and replace them when they become wet.  Wear a facemask and gloves Wear a disposable facemask at all times in the room and gloves when you touch or have contact with the patient's blood, body fluids, and/or secretions or excretions, such as sweat, saliva, sputum, nasal mucus, vomit, urine, or feces.  Ensure the mask fits over your nose and mouth tightly, and do not touch it during use. Throw out disposable facemasks and gloves after using them. Do not reuse. Wash your hands immediately after removing your facemask and gloves. If your personal clothing becomes contaminated, carefully remove clothing and launder. Wash your hands after handling contaminated clothing. Place all used disposable facemasks, gloves, and other waste in a lined container before disposing them with other household waste. Remove gloves and wash your hands immediately  after handling these items.  Do not share dishes, glasses, or other household items with the patient Avoid sharing household items. You should not share dishes, drinking glasses, cups, eating utensils, towels, bedding, or other items with a patient who is confirmed to have, or being evaluated for, COVID-19 infection. After the person uses these items, you should wash them thoroughly with soap and water.  Wash laundry thoroughly Immediately remove and wash clothes or bedding that have blood, body fluids, and/or secretions or excretions, such as sweat, saliva, sputum, nasal mucus, vomit, urine, or feces, on them. Wear gloves when handling laundry from the patient. Read and follow directions on labels of laundry or clothing items and detergent. In general, wash and dry with the warmest temperatures recommended on the label.  Clean all areas the individual has used often Clean all touchable surfaces, such as counters, tabletops, doorknobs, bathroom fixtures, toilets, phones, keyboards, tablets, and bedside tables, every day. Also, clean any surfaces that may have blood,  body fluids, and/or secretions or excretions on them. Wear gloves when cleaning surfaces the patient has come in contact with. Use a diluted bleach solution (e.g., dilute bleach with 1 part bleach and 10 parts water) or a household disinfectant with a label that says EPA-registered for coronaviruses. To make a bleach solution at home, add 1 tablespoon of bleach to 1 quart (4 cups) of water. For a larger supply, add  cup of bleach to 1 gallon (16 cups) of water. Read labels of cleaning products and follow recommendations provided on product labels. Labels contain instructions for safe and effective use of the cleaning product including precautions you should take when applying the product, such as wearing gloves or eye protection and making sure you have good ventilation during use of the product. Remove gloves and wash hands immediately  after cleaning.  Monitor yourself for signs and symptoms of illness Caregivers and household members are considered close contacts, should monitor their health, and will be asked to limit movement outside of the home to the extent possible. Follow the monitoring steps for close contacts listed on the symptom monitoring form.   ? If you have additional questions, contact your local health department or call the epidemiologist on call at [phone] (available 24/7). ? This guidance is subject to change. For the most up-to-date guidance from [facility], please refer to their website: [email or web address]   Increase activity slowly   Complete by: As directed    MyChart COVID-19 home monitoring program   Complete by: Oct 05, 2019    Is the patient willing to use the [place] for home monitoring?: Yes   Temperature monitoring   Complete by: Oct 05, 2019    After how many days would you like to receive a notification of this patient's flowsheet entries?: 1     Allergies as of 10/05/2019   No Known Allergies     Medication List    TAKE these medications   acetaminophen 500 MG tablet Commonly known as: TYLENOL Take 1,000 mg by mouth every 6 (six) hours as needed for moderate pain.   amLODipine 5 MG tablet Commonly known as: NORVASC Take 1 tablet (5 mg total) by mouth daily. Start taking on: October 06, 2019   ascorbic acid 500 MG tablet Commonly known as: VITAMIN C Take 1 tablet (500 mg total) by mouth 2 (two) times daily.   cyclobenzaprine 10 MG tablet Commonly known as: FLEXERIL Take 1 tablet (10 mg total) by mouth 3 (three) times daily as needed for muscle spasms.   dexamethasone 4 MG tablet Commonly known as: DECADRON Take 1 tablet (4 mg total) by mouth 3 (three) times daily.   fexofenadine 180 MG tablet Commonly known as: ALLEGRA Take 180 mg by mouth daily.   HYDROcodone-acetaminophen 5-325 MG  tablet Commonly known as: NORCO/VICODIN Take 1-2 tablets by mouth every 6 (six) hours as needed for moderate pain.   ibuprofen 200 MG tablet Commonly known as: ADVIL Take 600 mg by mouth every 6 (six) hours as needed for moderate pain.   pantoprazole 40 MG tablet Commonly known as: PROTONIX Take 1 tablet (40 mg total) by mouth daily. Start taking on: October 06, 2019   phentermine 37.5 MG tablet Commonly known as: ADIPEX-P Take 37.5 mg by mouth daily.   zinc sulfate 220 (50 Zn) MG capsule Take 1 capsule (220 mg total) by mouth daily. Start taking on: October 06, 2019      No Known Allergies Follow-up Information  pcp. Schedule an appointment as soon as possible for a visit in 3 week(s).        Kyung Rudd, MD Follow up on 10/06/2019.   Specialty: Radiation Oncology Why: f/iu at 430pm for CT simulation. Contact information: [address] [place] [address] [phone]            The results of significant diagnostics from this hospitalization (including imaging, microbiology, ancillary and laboratory) are listed below for reference.    Significant Diagnostic Studies: Ct Chest W Contrast  Result Date: 10/03/2019 CLINICAL DATA:  Metastatic lesion of the cervical spine, evaluate for primary malignancy EXAM: CT CHEST, ABDOMEN, AND PELVIS WITH CONTRAST TECHNIQUE: Multidetector CT imaging of the chest, abdomen and pelvis was performed following the standard protocol during bolus administration of intravenous contrast. CONTRAST:  187mL OMNIPAQUE IOHEXOL 300 MG/ML  SOLN COMPARISON:  None. FINDINGS: CT CHEST FINDINGS Cardiovascular: No significant vascular findings. Normal heart size. No pericardial effusion. Mediastinum/Nodes: No enlarged mediastinal, hilar, or axillary lymph nodes. Thyroid gland, trachea, and esophagus demonstrate no significant findings. Lungs/Pleura: There is a 6 mm pulmonary nodule of the lateral segment right middle lobe (series 6, image 83). No  pleural effusion or pneumothorax. Musculoskeletal: No chest wall mass or suspicious bone lesions identified. CT ABDOMEN PELVIS FINDINGS Hepatobiliary: No solid liver abnormality is seen. Hepatic steatosis. No gallstones, gallbladder wall thickening, or biliary dilatation. Pancreas: Unremarkable. No pancreatic ductal dilatation or surrounding inflammatory changes. Spleen: Normal in size without significant abnormality. Adrenals/Urinary Tract: Adrenal glands are unremarkable. Kidneys are normal, without renal calculi, solid lesion, or hydronephrosis. Bladder is unremarkable. Stomach/Bowel: Stomach is within normal limits. Appendix appears normal. No evidence of bowel wall thickening, distention, or inflammatory changes. Vascular/Lymphatic: There is a hypodense mass, which closely abuts and partially encases the left aspect of the aorta at the level of the diaphragmatic cruces, although does not appear to arise from the vessel, measuring 6.0 x 4.0 x 6.5 cm (series 2, image 57, series 5, image 116). No enlarged abdominal or pelvic lymph nodes. Reproductive: No mass or other abnormality. Other: No abdominal wall hernia or abnormality. No abdominopelvic ascites. Musculoskeletal: No acute or significant osseous findings. IMPRESSION: 1. There is a hypodense mass, which closely abuts and partially encases the left aspect of the aorta at the level of the diaphragmatic cruces, although does not appear to arise from the vessel, measuring 6.0 x 4.0 x 6.5 cm (series 2, image 57, series 5, image 116). Generally favor this to represent an additional metastatic lesion rather than a primary mass although this could reflect an unusual primary soft tissue malignancy. Consider PET/CT to further evaluate for sites of additional metabolically active metastatic or primary disease and assist in planning for tissue diagnosis. 2. There is a 6 mm pulmonary nodule of the lateral segment right middle lobe (series 6, image 83). This nodule is  nonspecific and metastatic disease is not excluded. 3. No other evidence of metastatic disease in the chest, abdomen, or pelvis. 4.  Hepatic steatosis. Electronically Signed   By: Eddie Candle M.D.   On: 10/03/2019 17:00   Ct Cervical Spine Wo Contrast  Result Date: 09/29/2019 CLINICAL DATA:  Posterior neck pain radiating into both shoulders. EXAM: CT CERVICAL SPINE WITHOUT CONTRAST TECHNIQUE: Multidetector CT imaging of the cervical spine was performed without intravenous contrast. Multiplanar CT image reconstructions were also generated. COMPARISON:  Radiographs dated 09/13/2019 FINDINGS: Alignment: There is a 3 mm anterolisthesis of C3 on C4. Skull base and vertebrae: There is extensive  destruction of left side of the C4 vertebral body with extensive destruction of the left lateral mass, left pedicle, left lamina, spinous process, and partial destruction of the right lamina. There is a pathologic fracture involving the vertebral body and left lateral mass. Tumor extends into the spinal canal compressing the spinal cord. Soft tissues and spinal canal: The tumor destroying the C4 vertebral body extends into the spinal canal compressing the spinal cord and thecal sac. The tumor extends into the left neural foramen and is immediately adjacent to the left vertebral artery. Disc levels:  C2-3: Normal. C3-4: 3 mm anterolisthesis of C3 on C4. No appreciable disc bulging or foraminal stenosis. However, there is abnormal soft tissue in and lateral to the left neural foramen obscuring the left vertebral artery with abnormal density in the left lateral recess and left neural foramen with abnormal density surrounding the left side of the thecal sac which likely represents there is also abnormal soft tissue surrounding the left lateral mass in the left transverse process and left lamina consistent with tumor extending into the soft tissues. C4-5: Tumor destroys at least 50% of the C4 vertebral body, the left pedicle, left  lateral mass, left lamina, spinous processes, left transverse process, and a portion of the right lamina with pathologic fractures of the vertebral body and lateral mass. Tumor extends into the spinal canal severely compressing the thecal sac. Tumor also extends into the paraspinal soft tissues and around the left vertebral artery and into the left posterolateral spinal musculature at C4. Tumor extends into the left neural foramen. C5-6: Negative. C6-7: Negative. C7-T1: Negative. T1-2 and T2-3: Negative. Upper chest: Normal. Other: None IMPRESSION: 1. 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 2. Tumor extends into the spinal canal and left neural foramina at C4-5 and C3-4. The tumor compresses the thecal sac and spinal cord at C3-4 and C4-5 and behind the C4 vertebral body. 3. Tumor extends into the left lateral recesses and left neural foramina at C3-4 and C4-5 and into the left posterolateral spinal musculature at C3-4 and C4-5. 4. 3 mm anterolisthesis of C3 on C4. at 3:35 pm to Raquel Sarna, Dr. Melven Sartorius assistant, who verbally acknowledged these results. Electronically Signed   By: Lorriane Shire M.D.   On: 09/29/2019 15:51   Mr Cervical Spine W Wo Contrast  Result Date: 10/01/2019 CLINICAL DATA:  Posterior neck pain with recently demonstrated vertebral tumor. EXAM: MRI CERVICAL SPINE WITHOUT AND WITH CONTRAST TECHNIQUE: Multiplanar and multiecho pulse sequences of the cervical spine, to include the craniocervical junction and cervicothoracic junction, were obtained without and with intravenous contrast. CONTRAST:  102mL GADAVIST GADOBUTROL 1 MMOL/ML IV SOLN COMPARISON:  CT cervical spine 09/29/2019 MR cervical spine 02/28/2019 FINDINGS: Alignment: Grade 1 anterolisthesis at C3-4, unchanged. Vertebrae: There is abnormal low T1/high T2-weighted  signal within the C4 vertebral body and extending into the left posterior elements. There is associated abnormal contrast enhancement that extends into the adjacent soft tissues. No other focal vertebral lesion. Vertebral body heights are normal. Cord: There is epidural extension of the tumor along the left dorsal aspect of the spinal canal at C4. Posterior Fossa, vertebral arteries, paraspinal tissues: The left vertebral artery is encased by the abnormally enhancing material. There is a mass within the right carotid space at the C2 level that measures 3.5 x 2.4 cm.  Disc levels: C1-C2: Normal. C2-C3: Normal disc space and facets. No spinal canal or neuroforaminal stenosis. C3-C4: There is extension of the C4 tumor into the left C3-4 neural foramen. There is tumor along the left dorsal epidural space. There is attenuation of the thecal sac. C4-C5: As above, contrast enhancing tumor of the left vertebral body and posterior elements that extends to the left epidural space and the left C3-4 and C4-5 neural foramina. The thecal sac is effaced due to mass effect from the tumor. No signal change within the spinal cord. C5-C6: Normal disc space and facets. No spinal canal or neuroforaminal stenosis. C6-C7: Normal disc space and facets. No spinal canal or neuroforaminal stenosis. C7-T1: Normal disc space and facets. No spinal canal or neuroforaminal stenosis. IMPRESSION: 1. Enhancing tumor of the C4 vertebral body and posterior elements with epidural extension into the left C3-4 and C4-5 neural foramina. Metastatic disease is suspected. 2. Epidural extension on the left dorsal spinal canal causes mass effect on the thecal sac without spinal cord signal change. 3. The left vertebral artery is encased by the C4 mass. 4. Mass within the right carotid space at the C2 level, measuring 3.5 x 2.4 cm. This is concerning for metastatic disease in the context of the known vertebral lesion. Paraganglioma is an alternative possibility.  Electronically Signed   By: Ulyses Jarred M.D.   On: 10/01/2019 22:19   Ct Abdomen Pelvis W Contrast  Result Date: 10/03/2019 CLINICAL DATA:  Metastatic lesion of the cervical spine, evaluate for primary malignancy EXAM: CT CHEST, ABDOMEN, AND PELVIS WITH CONTRAST TECHNIQUE: Multidetector CT imaging of the chest, abdomen and pelvis was performed following the standard protocol during bolus administration of intravenous contrast. CONTRAST:  19mL OMNIPAQUE IOHEXOL 300 MG/ML  SOLN COMPARISON:  None. FINDINGS: CT CHEST FINDINGS Cardiovascular: No significant vascular findings. Normal heart size. No pericardial effusion. Mediastinum/Nodes: No enlarged mediastinal, hilar, or axillary lymph nodes. Thyroid gland, trachea, and esophagus demonstrate no significant findings. Lungs/Pleura: There is a 6 mm pulmonary nodule of the lateral segment right middle lobe (series 6, image 83). No pleural effusion or pneumothorax. Musculoskeletal: No chest wall mass or suspicious bone lesions identified. CT ABDOMEN PELVIS FINDINGS Hepatobiliary: No solid liver abnormality is seen. Hepatic steatosis. No gallstones, gallbladder wall thickening, or biliary dilatation. Pancreas: Unremarkable. No pancreatic ductal dilatation or surrounding inflammatory changes. Spleen: Normal in size without significant abnormality. Adrenals/Urinary Tract: Adrenal glands are unremarkable. Kidneys are normal, without renal calculi, solid lesion, or hydronephrosis. Bladder is unremarkable. Stomach/Bowel: Stomach is within normal limits. Appendix appears normal. No evidence of bowel wall thickening, distention, or inflammatory changes. Vascular/Lymphatic: There is a hypodense mass, which closely abuts and partially encases the left aspect of the aorta at the level of the diaphragmatic cruces, although does not appear to arise from the vessel, measuring 6.0 x 4.0 x 6.5 cm (series 2, image 57, series 5, image 116). No enlarged abdominal or pelvic lymph nodes.  Reproductive: No mass or other abnormality. Other: No abdominal wall hernia or abnormality. No abdominopelvic ascites. Musculoskeletal: No acute or significant osseous findings. IMPRESSION: 1. There is a hypodense mass, which closely abuts and partially encases the left aspect of the aorta at the level of the diaphragmatic cruces, although does not appear to arise from the vessel, measuring 6.0 x 4.0 x 6.5 cm (series 2, image 57, series 5, image 116). Generally favor this to represent an additional metastatic lesion rather than a primary mass although this could reflect an unusual primary soft  tissue malignancy. Consider PET/CT to further evaluate for sites of additional metabolically active metastatic or primary disease and assist in planning for tissue diagnosis. 2. There is a 6 mm pulmonary nodule of the lateral segment right middle lobe (series 6, image 83). This nodule is nonspecific and metastatic disease is not excluded. 3. No other evidence of metastatic disease in the chest, abdomen, or pelvis. 4.  Hepatic steatosis. Electronically Signed   By: Eddie Candle M.D.   On: 10/03/2019 17:00   Ct Biopsy  Result Date: 10/05/2019 INDICATION: 43 year old male with a destructive lesion of the left side of the C4 vertebral body with soft tissue extension. Additionally, he has a large retroperitoneal para-aortic implant. This appears to be neoplastic disease of uncertain etiology. He requires urgent tissue sampling to facilitate either XRT or surgical decompression of his impending cervical cord compromise. He presents for CT-guided biopsy. EXAM: CT-guided biopsy MEDICATIONS: None. ANESTHESIA/SEDATION: No sedation. FLUOROSCOPY TIME:  None. COMPLICATIONS: None immediate. PROCEDURE: Informed written consent was obtained from the patient after a thorough discussion of the procedural risks, benefits and alternatives. All questions were addressed. Maximal Sterile Barrier Technique was utilized including caps, mask,  sterile gowns, sterile gloves, sterile drape, hand hygiene and skin antiseptic. A timeout was performed prior to the initiation of the procedure. The patient was positioned prone and head first on the CT gantry. Planning axial CT images were performed. The destructive mass in the left aspect of C4 in the associated soft tissue component were successfully localized. A suitable skin entry site was selected and marked. The overlying skin was sterilely prepped and draped in the standard fashion with chlorhexidine skin prep. Local anesthesia was attained by infiltration with 1% lidocaine. A small dermatotomy was made. Under intermittent CT guidance, a 17 gauge introducer needle was carefully advanced and positioned at the margin of the soft tissue component of the mass. Multiple 18 gauge core biopsies were then coaxially obtained using the bio Pince automated biopsy device. Biopsy specimens were placed in formalin and delivered to pathology for further analysis. The introducer needle was removed. Follow-up CT imaging demonstrates no hematoma or evidence of other complication. The patient tolerated the procedure well. IMPRESSION: Technically successful CT-guided biopsy of the soft tissue component of the left C4 mass. Signed, Criselda Peaches, MD, [facility] Radiology Electronically Signed   By: Jacqulynn Cadet M.D.   On: 10/05/2019 08:23    Microbiology: Recent Results (from the past 240 hour(s))  SARS CORONAVIRUS 2 (TAT 6-24 HRS) Nasopharyngeal Nasopharyngeal Swab     Status: Abnormal   Collection Time: 10/02/19  8:57 PM   Specimen: Nasopharyngeal Swab  Result Value Ref Range Status   SARS Coronavirus 2 POSITIVE (A) NEGATIVE Final    Comment: RESULT CALLED TO, READ BACK BY AND VERIFIED WITH: Jorge Ny RN 9:30 10/03/19 ([email or web address]) (NOTE) SARS-CoV-2 target nucleic acids are DETECTED. The SARS-CoV-2 RNA is generally detectable in upper and  lower respiratory specimens during the acute phase of infection. Positive results are indicative of active infection with SARS-CoV-2. Clinical  correlation with patient history and other diagnostic information is necessary to determine patient infection status. Positive results do  not rule out bacterial infection or co-infection with other viruses. The expected result is Negative. Fact Sheet for Patients: SugarRoll.be Fact Sheet for Healthcare Providers: https://www.woods-mathews.com/ This test is not yet approved or cleared by the [place] FDA and  has been authorized for detection and/or diagnosis of SARS-CoV-2 by FDA under an  Emergency Use Authorization (EUA). This EUA will remain  in effect (meaning this test can be used) for  the duration of the COVID-19 declaration under Section 564(b)(1) of the Act, 21 U.S.C. section 360bbb-3(b)(1), unless the authorization is terminated or revoked sooner. Performed at [facility], [place] [address], [place], [place] [address]      Labs: Basic Metabolic Panel: Recent Labs  Lab 10/03/19 0625 10/04/19 0943 10/05/19 0440  NA 137 136 135  K 5.1 4.5 4.7  CL 101 100 100  CO2 27 23 24   GLUCOSE 154* 185* 162*  BUN 12 15 18   CREATININE 0.81 0.76 0.74  CALCIUM 10.0 9.9 9.8  MG  --  2.1 2.2  PHOS  --  2.8 2.8   Liver Function Tests: Recent Labs  Lab 10/03/19 0625 10/04/19 0943 10/05/19 0440  AST 26 27 23   ALT 68* 62* 58*  ALKPHOS 69 58 62  BILITOT 0.4 0.3 0.5  PROT 7.6 7.9 7.6  ALBUMIN 4.6 4.8 4.5   No results for input(s): LIPASE, AMYLASE in the last 168 hours. No results for input(s): AMMONIA in the last 168 hours. CBC: Recent Labs  Lab 10/03/19 0625 10/04/19 0943 10/05/19 0440  WBC 8.6 19.9* 23.5*  NEUTROABS  --  17.8* 19.9*  HGB 14.6 14.9 14.2  HCT 44.4 46.2 43.2  MCV 89.7 90.9 89.4  PLT 315 378 370   Cardiac Enzymes: No results for input(s): CKTOTAL, CKMB,  CKMBINDEX, TROPONINI in the last 168 hours. BNP: BNP (last 3 results) No results for input(s): BNP in the last 8760 hours.  ProBNP (last 3 results) No results for input(s): PROBNP in the last 8760 hours.  CBG: Recent Labs  Lab 10/04/19 1221 10/04/19 1635 10/04/19 2157 10/05/19 0803 10/05/19 1208  GLUCAP 249* 133* 229* 170* 196*       Signed:  Irine Seal MD.  [facility] 10/05/2019, 1:22 PM

## 2019-10-05 NOTE — Progress Notes (Signed)
I spoke with Dr. Grandville Silos this am as well as the patient. He will come tomorrow afternoon since he will be able to DC home this afternoon. He will need to wear a mask and will be given instructions to proceed with radiotherapy planning and we anticipate treatment to begin on Monday as previously outlined. His wife is also going to drive over after discharge so they know how to get into the department as his therapy and planning will be after hours so that other patients are not in the department. Dr. Vertell Limber is also aware. We recommended that he drop the dexamethasone 4mg  dose to TID at discharge. He should also take OTC PPI to avoid PUD from steroids.      Carola Rhine, PAC

## 2019-10-05 NOTE — Progress Notes (Signed)
ANTICOAGULATION CONSULT NOTE - Post IR procedure  Pharmacy Consult for resuming anticoagulation post procedure Indication: VTE prophylaxis  No Known Allergies  Patient Measurements: Height: 6\' 1"  (185.4 cm) Weight: (!) 335 lb (152 kg)(weight from MD office today.) IBW/kg (Calculated) : 79.9   Vital Signs: Temp: 98.7 F (37.1 C) (10/28 2002) Temp Source: Oral (10/28 2002) BP: 165/77 (10/28 2002) Pulse Rate: 96 (10/28 2002)  Labs: Recent Labs    10/03/19 0625 10/04/19 0943 10/04/19 1110  HGB 14.6 14.9  --   HCT 44.4 46.2  --   PLT 315 378  --   LABPROT  --   --  12.1  INR  --   --  0.9  CREATININE 0.81 0.76  --     Estimated Creatinine Clearance: 179.3 mL/min (by C-G formula based on SCr of 0.76 mg/dL).   Medical History: Past Medical History:  Diagnosis Date  . Obesity     Medications:  Scheduled:  . dexamethasone  4 mg Oral Q6H  . loratadine  10 mg Oral Daily  . pantoprazole  40 mg Oral Daily  . vitamin C  500 mg Oral BID  . zinc sulfate  220 mg Oral Daily   Infusions:  . sodium chloride      Assessment: 58 yoM s/p CT guided biopsy of soft tissue component of left C4 mass on 10/28 at 1746.     Plan:  Resume Lovenox 80 mg sq daily for DVT prophylaxis 10/29 at 10 am (Day +1, next am)  Lawana Pai R 10/05/2019,1:43 AM

## 2019-10-06 ENCOUNTER — Ambulatory Visit
Admission: RE | Admit: 2019-10-06 | Discharge: 2019-10-06 | Disposition: A | Discharge status: Home / Self Care | Payer: BC Managed Care – PPO | Source: Ambulatory Visit | Attending: Radiation Oncology | Admitting: Radiation Oncology

## 2019-10-06 ENCOUNTER — Other Ambulatory Visit: Payer: Self-pay | Admitting: Radiation Oncology

## 2019-10-06 ENCOUNTER — Telehealth: Payer: Self-pay | Admitting: *Deleted

## 2019-10-06 ENCOUNTER — Telehealth: Payer: Self-pay | Admitting: Radiation Oncology

## 2019-10-06 DIAGNOSIS — C7951 Secondary malignant neoplasm of bone: Secondary | ICD-10-CM

## 2019-10-06 LAB — MULTIPLE MYELOMA PANEL, SERUM
Albumin SerPl Elph-Mcnc: 4.3 g/dL (ref 2.9–4.4)
Albumin/Glob SerPl: 1.5 (ref 0.7–1.7)
Alpha 1: 0.2 g/dL (ref 0.0–0.4)
Alpha2 Glob SerPl Elph-Mcnc: 1 g/dL (ref 0.4–1.0)
B-Globulin SerPl Elph-Mcnc: 1 g/dL (ref 0.7–1.3)
Gamma Glob SerPl Elph-Mcnc: 0.6 g/dL (ref 0.4–1.8)
Globulin, Total: 2.9 g/dL (ref 2.2–3.9)
IgA: 90 mg/dL (ref 90–386)
IgG (Immunoglobin G), Serum: 796 mg/dL (ref 603–1613)
IgM (Immunoglobulin M), Srm: 76 mg/dL (ref 20–172)
Total Protein ELP: 7.2 g/dL (ref 6.0–8.5)

## 2019-10-06 NOTE — Telephone Encounter (Signed)
I spoke with the patient and his wife by phone today to let them know we were still awaiting final pathology. Dr. Vic Ripper has reviewed his slides but stains are still pending and dx cannot be made yet. We will follow up with this on Monday.     Carola Rhine, PAC

## 2019-10-06 NOTE — Telephone Encounter (Signed)
CALLED PATIENT TO INFORM HOW TO GET TO THE BACK ENTRANCE OF THE CANCER CENTER, SPOKE WITH PATIENT'S WIFE TONYA AND SHE WILL BE COMING WITH HIM AND SHE IS AWARE HOW TO GET TO THE BACK ENTRANCE.

## 2019-10-09 ENCOUNTER — Ambulatory Visit: Payer: BC Managed Care – PPO

## 2019-10-09 ENCOUNTER — Telehealth: Payer: Self-pay | Admitting: Radiation Oncology

## 2019-10-09 ENCOUNTER — Other Ambulatory Visit: Payer: Self-pay

## 2019-10-09 NOTE — Telephone Encounter (Signed)
I called the patient's wife to let her know that we do not have final pathology results yet. Dr. Vic Ripper called me and we discussed that the first series of stains on the tissue slides is inconclusive, but does not show cancer. 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 We will know these results tomorrow. I've discussed with Dr. Lisbeth Renshaw and Dr. Vertell Limber. Hopefully we will know more tomorrow, but without a cancer diagnosis we will hold on any radiotherapy. He may rather, need surgical intervention, and Dr. Vertell Limber is actively involved and would like to hold off on surgery if possible, though this has not been ruled out. 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 He will continue Dexamethasone 4 mg TID for now and has been doing GI prophylaxsis with PPI.

## 2019-10-10 ENCOUNTER — Other Ambulatory Visit: Payer: Self-pay | Admitting: Neurosurgery

## 2019-10-10 ENCOUNTER — Ambulatory Visit: Payer: BC Managed Care – PPO | Admitting: Radiation Oncology

## 2019-10-10 ENCOUNTER — Telehealth: Payer: Self-pay | Admitting: Radiation Oncology

## 2019-10-10 NOTE — Telephone Encounter (Signed)
The patient's wife called and wanted to check to see if we had heard anything. I let her know that we have not heard back from pathology but I will follow up with them if we haven't heard anything in the next hour or two.

## 2019-10-11 ENCOUNTER — Encounter (HOSPITAL_COMMUNITY): Payer: Self-pay | Admitting: *Deleted

## 2019-10-11 ENCOUNTER — Telehealth: Payer: Self-pay | Admitting: *Deleted

## 2019-10-11 ENCOUNTER — Telehealth: Payer: Self-pay | Admitting: Radiation Oncology

## 2019-10-11 ENCOUNTER — Ambulatory Visit: Payer: BC Managed Care – PPO

## 2019-10-11 DIAGNOSIS — C7951 Secondary malignant neoplasm of bone: Secondary | ICD-10-CM

## 2019-10-11 NOTE — Progress Notes (Signed)
Pt requested that spouse, Lavella Lemons, complete SDW-pre-op call. Spouse denies that pt C/O SOB and chest pain. Spouse denies that pt is under the care of a cardiologist. Spouse stated that pt PCP is Dr. Margo Aye in [place], [place]. Spouse denies that pt had a stress test, echo, cardiac cath, EKG and chest x ray. Spouse stated that pt remains asymptomatic ( COVID + on 10/02/19 ). Spouse stated that pt last dose of Adipex was 10/02/19. Spouse made aware to have pt stop taking Aspirin (unless otherwise advised by surgeon), vitamins, fish oil and herbal medications. Do not take any NSAIDs ie: Ibuprofen, Advil, Naproxen (Aleve), Motrin, BC and Goody Powder. Spouse denies that pt has HTN " he takes Norvasc because the Decadron had his BP elevated. "  Spouse verbalized understanding of all pre-op instructions.

## 2019-10-11 NOTE — Progress Notes (Signed)
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 During admission he was found to be Covid positive. He was asymptomatic. Per policy, DOS will be 10 days from date of positive test and patient has remained asymptomatic and can proceed with surgery as planned. Jovita Kussmaul, RN aware.   Labs from 10/04/28 reviewed. Leukocytosis likely secondary to steroids. Otherwise unremarkable.   MRI C Spine 09/30/19: IMPRESSION: 1. Enhancing tumor of the C4 vertebral body and posterior elements with epidural extension into the left C3-4 and C4-5 neural foramina. Metastatic disease is suspected. 2. Epidural extension on the left dorsal spinal canal causes mass effect on the thecal sac without spinal cord signal change. 3. The left vertebral artery is encased by the C4 mass. 4. Mass within the right carotid space at the C2 level, measuring 3.5 x 2.4 cm. This is concerning for metastatic disease in the context of the known vertebral lesion. Paraganglioma is an alternative possibility.   Wynonia Musty [facility] Short Stay Center/Anesthesiology Phone [phone] 10/11/2019 2:09 PM

## 2019-10-11 NOTE — Anesthesia Preprocedure Evaluation (Addendum)
Anesthesia Evaluation  Patient identified by MRN, date of birth, ID band Patient awake    Reviewed: Allergy & Precautions, NPO status , Patient's Chart, lab work & pertinent test results  History of Anesthesia Complications Negative for: history of anesthetic complications  Airway Mallampati: III  TM Distance: >3 FB Neck ROM: Full    Dental no notable dental hx.    Pulmonary  Screening COVID test positive 10/02/19, asymptomatic   Pulmonary exam normal        Cardiovascular negative cardio ROS Normal cardiovascular exam     Neuro/Psych Cervical spine tumor negative psych ROS   GI/Hepatic negative GI ROS, Neg liver ROS,   Endo/Other  Morbid obesity  Renal/GU negative Renal ROS  negative genitourinary   Musculoskeletal negative musculoskeletal ROS (+)   Abdominal   Peds  Hematology negative hematology ROS (+)   Anesthesia Other Findings Day of surgery medications reviewed with patient.  Reproductive/Obstetrics negative OB ROS                            Anesthesia Physical Anesthesia Plan  ASA: III  Anesthesia Plan: General   Post-op Pain Management:    Induction: Intravenous  PONV Risk Score and Plan: 3 and Treatment may vary due to age or medical condition, Ondansetron, Dexamethasone and Midazolam  Airway Management Planned: Oral ETT and Video Laryngoscope Planned  Additional Equipment: Arterial line  Intra-op Plan:   Post-operative Plan: Possible Post-op intubation/ventilation  Informed Consent: I have reviewed the patients History and Physical, chart, labs and discussed the procedure including the risks, benefits and alternatives for the proposed anesthesia with the patient or authorized representative who has indicated his/her understanding and acceptance.     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 During admission he was found to be Covid positive. He was asymptomatic. Per policy, DOS will be 10 days from date of positive test and patient has remained asymptomatic and can proceed with surgery as planned. Jovita Kussmaul, RN aware.   Labs from 10/04/28 reviewed. Leukocytosis likely secondary to steroids. Otherwise unremarkable.   MRI C Spine 09/30/19: IMPRESSION: 1. Enhancing tumor of the C4 vertebral body and posterior elements with epidural extension into the left C3-4 and C4-5 neural foramina. Metastatic disease is suspected. 2. Epidural extension on the left dorsal spinal canal causes mass effect on the thecal sac without spinal cord signal change. 3. The left vertebral artery is encased by the C4 mass. 4. Mass within the right carotid space at the C2 level, measuring 3.5 x 2.4 cm. This is concerning for metastatic disease in the context of the known vertebral lesion. Paraganglioma is an alternative possibility.)      Anesthesia Quick Evaluation

## 2019-10-11 NOTE — Telephone Encounter (Signed)
I spoke with Dr. Boris Sharper at [facility] after Dr. Jaquita Folds was able to give me his name. Dr. Shaaron Adler and Dr. Maylon Peppers treat patients with paraganglioma diagnoses. He was in agreement with our plans for surgical decompression of the C spine, followed by postoperative radiotherapy. The patient will need chromogranin A levels and we will order this tomorrow once he's admitted. I've asked radiology to powershare his images to [facility]. Dr. Shaaron Adler or Maylon Peppers will see the patient postoperatively and orders for referral were placed. I've notified Dr. Vertell Limber as well. I called and let Mrs. Latray know the next steps and we will be available to discuss radiotherapy once Dr. Shaaron Adler or Dr. Maylon Peppers give further direction on next steps.

## 2019-10-11 NOTE — Telephone Encounter (Signed)
Called patient to inform of appt. With Dr. Shaaron Adler on 10-20-19 - arrival time - 9:45 am - address [place]. [facility], ph. no.- [phone], spoke with patient and he is aware of this appt.

## 2019-10-12 ENCOUNTER — Ambulatory Visit: Payer: BC Managed Care – PPO

## 2019-10-12 ENCOUNTER — Other Ambulatory Visit: Payer: Self-pay

## 2019-10-12 ENCOUNTER — Inpatient Hospital Stay (HOSPITAL_COMMUNITY)
Admission: RE | Admit: 2019-10-12 | Discharge: 2019-10-13 | DRG: 464 | Disposition: A | Discharge status: Home / Self Care | Payer: BC Managed Care – PPO | Attending: Neurosurgery | Admitting: Neurosurgery

## 2019-10-12 ENCOUNTER — Inpatient Hospital Stay (HOSPITAL_COMMUNITY): Payer: BC Managed Care – PPO | Admitting: Vascular Surgery

## 2019-10-12 ENCOUNTER — Encounter (HOSPITAL_COMMUNITY): Payer: Self-pay

## 2019-10-12 ENCOUNTER — Other Ambulatory Visit: Payer: Self-pay | Admitting: Radiation Therapy

## 2019-10-12 ENCOUNTER — Inpatient Hospital Stay (HOSPITAL_COMMUNITY)
Admission: RE | Disposition: A | Discharge status: Home / Self Care | Payer: Self-pay | Source: Home / Self Care | Attending: Neurosurgery

## 2019-10-12 ENCOUNTER — Inpatient Hospital Stay (HOSPITAL_COMMUNITY): Payer: BC Managed Care – PPO

## 2019-10-12 DIAGNOSIS — Z6841 Body Mass Index (BMI) 40.0 and over, adult: Secondary | ICD-10-CM

## 2019-10-12 DIAGNOSIS — M4802 Spinal stenosis, cervical region: Secondary | ICD-10-CM | POA: Diagnosis present

## 2019-10-12 DIAGNOSIS — Z981 Arthrodesis status: Secondary | ICD-10-CM

## 2019-10-12 DIAGNOSIS — C7951 Secondary malignant neoplasm of bone: Principal | ICD-10-CM | POA: Diagnosis present

## 2019-10-12 DIAGNOSIS — M4312 Spondylolisthesis, cervical region: Secondary | ICD-10-CM | POA: Diagnosis present

## 2019-10-12 DIAGNOSIS — G992 Myelopathy in diseases classified elsewhere: Secondary | ICD-10-CM | POA: Diagnosis present

## 2019-10-12 DIAGNOSIS — Z8619 Personal history of other infectious and parasitic diseases: Secondary | ICD-10-CM

## 2019-10-12 DIAGNOSIS — Z419 Encounter for procedure for purposes other than remedying health state, unspecified: Secondary | ICD-10-CM

## 2019-10-12 HISTORY — PX: LAMINECTOMY: SHX219

## 2019-10-12 HISTORY — DX: Radiculopathy, cervical region: M54.12

## 2019-10-12 HISTORY — DX: Other complications of anesthesia, initial encounter: T88.59XA

## 2019-10-12 LAB — TYPE AND SCREEN
ABO/RH(D): O POS
Antibody Screen: NEGATIVE

## 2019-10-12 LAB — MRSA PCR SCREENING: MRSA by PCR: NEGATIVE

## 2019-10-12 LAB — SURGICAL PATHOLOGY

## 2019-10-12 LAB — ABO/RH: ABO/RH(D): O POS

## 2019-10-12 SURGERY — CERVICAL LAMINECTOMY FOR TUMOR
Anesthesia: General | Site: Spine Cervical

## 2019-10-12 MED ORDER — ZINC SULFATE 220 (50 ZN) MG PO CAPS
220.0000 mg | ORAL_CAPSULE | Freq: Every day | ORAL | Status: DC
  Administered 2019-10-13: 09:00:00 220 mg via ORAL
  Filled 2019-10-12: qty 1

## 2019-10-12 MED ORDER — LABETALOL HCL 5 MG/ML IV SOLN
INTRAVENOUS | Status: AC
  Filled 2019-10-12: qty 4

## 2019-10-12 MED ORDER — VITAMIN C 500 MG PO TABS
500.0000 mg | ORAL_TABLET | Freq: Two times a day (BID) | ORAL | Status: DC
  Administered 2019-10-12 – 2019-10-13 (×2): 500 mg via ORAL
  Filled 2019-10-12 (×2): qty 1

## 2019-10-12 MED ORDER — ROCURONIUM BROMIDE 50 MG/5ML IV SOSY
PREFILLED_SYRINGE | INTRAVENOUS | Status: DC | PRN
  Administered 2019-10-12: 40 mg via INTRAVENOUS
  Administered 2019-10-12: 30 mg via INTRAVENOUS
  Administered 2019-10-12: 50 mg via INTRAVENOUS
  Administered 2019-10-12: 30 mg via INTRAVENOUS

## 2019-10-12 MED ORDER — SODIUM CHLORIDE 0.9% FLUSH
3.0000 mL | Freq: Two times a day (BID) | INTRAVENOUS | Status: DC
  Administered 2019-10-12 – 2019-10-13 (×2): 3 mL via INTRAVENOUS

## 2019-10-12 MED ORDER — ACETAMINOPHEN 325 MG PO TABS
650.0000 mg | ORAL_TABLET | ORAL | Status: DC | PRN
  Administered 2019-10-13: 650 mg via ORAL
  Filled 2019-10-12: qty 2

## 2019-10-12 MED ORDER — ONDANSETRON HCL 4 MG/2ML IJ SOLN: 4.0000 mg | Freq: Four times a day (QID) | INTRAMUSCULAR | Status: DC | PRN

## 2019-10-12 MED ORDER — PROMETHAZINE HCL 25 MG/ML IJ SOLN: 6.2500 mg | INTRAMUSCULAR | Status: DC | PRN

## 2019-10-12 MED ORDER — ACETAMINOPHEN 500 MG PO TABS
1000.0000 mg | ORAL_TABLET | Freq: Once | ORAL | Status: DC
  Filled 2019-10-12: qty 2

## 2019-10-12 MED ORDER — ACETAMINOPHEN 650 MG RE SUPP: 650.0000 mg | RECTAL | Status: DC | PRN

## 2019-10-12 MED ORDER — BACITRACIN ZINC 500 UNIT/GM EX OINT
TOPICAL_OINTMENT | CUTANEOUS | Status: AC
  Filled 2019-10-12: qty 28.35

## 2019-10-12 MED ORDER — PANTOPRAZOLE SODIUM 40 MG PO TBEC
40.0000 mg | DELAYED_RELEASE_TABLET | Freq: Every day | ORAL | Status: DC
  Administered 2019-10-13: 40 mg via ORAL
  Filled 2019-10-12: qty 1

## 2019-10-12 MED ORDER — THROMBIN 5000 UNITS EX SOLR
CUTANEOUS | Status: AC
  Filled 2019-10-12: qty 5000

## 2019-10-12 MED ORDER — FENTANYL CITRATE (PF) 250 MCG/5ML IJ SOLN
INTRAMUSCULAR | Status: AC
  Filled 2019-10-12: qty 5

## 2019-10-12 MED ORDER — ONDANSETRON HCL 4 MG/2ML IJ SOLN
INTRAMUSCULAR | Status: AC
  Filled 2019-10-12: qty 2

## 2019-10-12 MED ORDER — LIDOCAINE 2% (20 MG/ML) 5 ML SYRINGE
INTRAMUSCULAR | Status: DC | PRN
  Administered 2019-10-12: 60 mg via INTRAVENOUS
  Administered 2019-10-12: 40 mg via INTRAVENOUS

## 2019-10-12 MED ORDER — ONDANSETRON HCL 4 MG PO TABS: 4.0000 mg | ORAL_TABLET | Freq: Four times a day (QID) | ORAL | Status: DC | PRN

## 2019-10-12 MED ORDER — LIDOCAINE 2% (20 MG/ML) 5 ML SYRINGE
INTRAMUSCULAR | Status: AC
  Filled 2019-10-12: qty 10

## 2019-10-12 MED ORDER — SODIUM CHLORIDE 0.9 % IV SOLN: 250.0000 mL | INTRAVENOUS | Status: DC

## 2019-10-12 MED ORDER — BUPIVACAINE HCL (PF) 0.25 % IJ SOLN
INTRAMUSCULAR | Status: DC | PRN
  Administered 2019-10-12: 10 mL

## 2019-10-12 MED ORDER — LABETALOL HCL 5 MG/ML IV SOLN
10.0000 mg | INTRAVENOUS | Status: AC | PRN
  Administered 2019-10-12 (×2): 10 mg via INTRAVENOUS

## 2019-10-12 MED ORDER — ACETAMINOPHEN 500 MG PO TABS: 1000.0000 mg | ORAL_TABLET | Freq: Four times a day (QID) | ORAL | Status: DC | PRN

## 2019-10-12 MED ORDER — DEXTROSE 5 % IV SOLN
3.0000 g | Freq: Three times a day (TID) | INTRAVENOUS | Status: AC
  Administered 2019-10-13: 05:00:00 3 g via INTRAVENOUS
  Filled 2019-10-12 (×4): qty 3000

## 2019-10-12 MED ORDER — SODIUM CHLORIDE 0.9% FLUSH: 3.0000 mL | INTRAVENOUS | Status: DC | PRN

## 2019-10-12 MED ORDER — BUPIVACAINE HCL (PF) 0.25 % IJ SOLN
INTRAMUSCULAR | Status: AC
  Filled 2019-10-12: qty 30

## 2019-10-12 MED ORDER — CHLORHEXIDINE GLUCONATE CLOTH 2 % EX PADS: 6.0000 | MEDICATED_PAD | Freq: Once | CUTANEOUS | Status: DC

## 2019-10-12 MED ORDER — LORATADINE 10 MG PO TABS
10.0000 mg | ORAL_TABLET | Freq: Every day | ORAL | Status: DC
  Administered 2019-10-13: 10 mg via ORAL
  Filled 2019-10-12: qty 1

## 2019-10-12 MED ORDER — BISACODYL 10 MG RE SUPP: 10.0000 mg | Freq: Every day | RECTAL | Status: DC | PRN

## 2019-10-12 MED ORDER — KCL IN DEXTROSE-NACL 20-5-0.45 MEQ/L-%-% IV SOLN: INTRAVENOUS | Status: DC

## 2019-10-12 MED ORDER — SUGAMMADEX SODIUM 500 MG/5ML IV SOLN
INTRAVENOUS | Status: AC
  Filled 2019-10-12: qty 5

## 2019-10-12 MED ORDER — SUCCINYLCHOLINE CHLORIDE 20 MG/ML IJ SOLN
INTRAMUSCULAR | Status: DC | PRN
  Administered 2019-10-12: 200 mg via INTRAVENOUS

## 2019-10-12 MED ORDER — HYDROMORPHONE HCL 1 MG/ML IJ SOLN
0.2500 mg | INTRAMUSCULAR | Status: DC | PRN
  Administered 2019-10-12: 0.5 mg via INTRAVENOUS

## 2019-10-12 MED ORDER — HYDROMORPHONE HCL 1 MG/ML IJ SOLN
INTRAMUSCULAR | Status: AC
  Filled 2019-10-12: qty 1

## 2019-10-12 MED ORDER — CYCLOBENZAPRINE HCL 10 MG PO TABS: 10.0000 mg | ORAL_TABLET | Freq: Three times a day (TID) | ORAL | Status: DC | PRN

## 2019-10-12 MED ORDER — OXYCODONE HCL 5 MG PO TABS: 5.0000 mg | ORAL_TABLET | ORAL | Status: DC | PRN

## 2019-10-12 MED ORDER — OXYCODONE HCL 5 MG/5ML PO SOLN: 5.0000 mg | Freq: Once | ORAL | Status: DC | PRN

## 2019-10-12 MED ORDER — LIDOCAINE-EPINEPHRINE 1 %-1:100000 IJ SOLN
INTRAMUSCULAR | Status: AC
  Filled 2019-10-12: qty 1

## 2019-10-12 MED ORDER — POLYETHYLENE GLYCOL 3350 17 G PO PACK: 17.0000 g | PACK | Freq: Every day | ORAL | Status: DC | PRN

## 2019-10-12 MED ORDER — ESMOLOL HCL 100 MG/10ML IV SOLN
INTRAVENOUS | Status: DC | PRN
  Administered 2019-10-12: 50 mg via INTRAVENOUS

## 2019-10-12 MED ORDER — PHENTERMINE HCL 37.5 MG PO TABS: 37.5000 mg | ORAL_TABLET | Freq: Every day | ORAL | Status: DC

## 2019-10-12 MED ORDER — MIDAZOLAM HCL 5 MG/5ML IJ SOLN
INTRAMUSCULAR | Status: DC | PRN
  Administered 2019-10-12: 2 mg via INTRAVENOUS

## 2019-10-12 MED ORDER — LACTATED RINGERS IV SOLN
INTRAVENOUS | Status: DC
  Administered 2019-10-12 (×2): via INTRAVENOUS

## 2019-10-12 MED ORDER — CHLORHEXIDINE GLUCONATE CLOTH 2 % EX PADS: 6.0000 | MEDICATED_PAD | Freq: Every day | CUTANEOUS | Status: DC

## 2019-10-12 MED ORDER — THROMBIN 20000 UNITS EX SOLR
CUTANEOUS | Status: AC
  Filled 2019-10-12: qty 20000

## 2019-10-12 MED ORDER — PROPOFOL 10 MG/ML IV BOLUS
INTRAVENOUS | Status: AC
  Filled 2019-10-12: qty 20

## 2019-10-12 MED ORDER — DEXAMETHASONE 4 MG PO TABS
4.0000 mg | ORAL_TABLET | Freq: Three times a day (TID) | ORAL | Status: DC
  Administered 2019-10-12 – 2019-10-13 (×2): 4 mg via ORAL
  Filled 2019-10-12 (×4): qty 1

## 2019-10-12 MED ORDER — DOCUSATE SODIUM 100 MG PO CAPS
100.0000 mg | ORAL_CAPSULE | Freq: Two times a day (BID) | ORAL | Status: DC
  Administered 2019-10-12 – 2019-10-13 (×2): 100 mg via ORAL
  Filled 2019-10-12 (×2): qty 1

## 2019-10-12 MED ORDER — PROPOFOL 10 MG/ML IV BOLUS
INTRAVENOUS | Status: DC | PRN
  Administered 2019-10-12: 200 mg via INTRAVENOUS

## 2019-10-12 MED ORDER — ALUM & MAG HYDROXIDE-SIMETH 200-200-20 MG/5ML PO SUSP: 30.0000 mL | Freq: Four times a day (QID) | ORAL | Status: DC | PRN

## 2019-10-12 MED ORDER — FLEET ENEMA 7-19 GM/118ML RE ENEM: 1.0000 | ENEMA | Freq: Once | RECTAL | Status: DC | PRN

## 2019-10-12 MED ORDER — ONDANSETRON HCL 4 MG/2ML IJ SOLN
INTRAMUSCULAR | Status: DC | PRN
  Administered 2019-10-12: 4 mg via INTRAVENOUS

## 2019-10-12 MED ORDER — FENTANYL CITRATE (PF) 100 MCG/2ML IJ SOLN
INTRAMUSCULAR | Status: DC | PRN
  Administered 2019-10-12: 100 ug via INTRAVENOUS
  Administered 2019-10-12: 150 ug via INTRAVENOUS
  Administered 2019-10-12: 50 ug via INTRAVENOUS
  Administered 2019-10-12 (×2): 100 ug via INTRAVENOUS

## 2019-10-12 MED ORDER — ZOLPIDEM TARTRATE 5 MG PO TABS: 5.0000 mg | ORAL_TABLET | Freq: Every evening | ORAL | Status: DC | PRN

## 2019-10-12 MED ORDER — CEFAZOLIN SODIUM-DEXTROSE 2-4 GM/100ML-% IV SOLN: 2.0000 g | INTRAVENOUS | Status: DC

## 2019-10-12 MED ORDER — HYDROCODONE-ACETAMINOPHEN 5-325 MG PO TABS
2.0000 | ORAL_TABLET | ORAL | Status: DC | PRN
  Administered 2019-10-13: 2 via ORAL

## 2019-10-12 MED ORDER — SUCCINYLCHOLINE CHLORIDE 200 MG/10ML IV SOSY
PREFILLED_SYRINGE | INTRAVENOUS | Status: AC
  Filled 2019-10-12: qty 10

## 2019-10-12 MED ORDER — THROMBIN 5000 UNITS EX SOLR
OROMUCOSAL | Status: DC | PRN
  Administered 2019-10-12 (×2): via TOPICAL

## 2019-10-12 MED ORDER — HEMOSTATIC AGENTS (NO CHARGE) OPTIME
TOPICAL | Status: DC | PRN
  Administered 2019-10-12: 1 via TOPICAL

## 2019-10-12 MED ORDER — METHOCARBAMOL 500 MG PO TABS
500.0000 mg | ORAL_TABLET | Freq: Four times a day (QID) | ORAL | Status: DC | PRN
  Administered 2019-10-13: 11:00:00 500 mg via ORAL
  Filled 2019-10-12: qty 1

## 2019-10-12 MED ORDER — PHENOL 1.4 % MT LIQD: 1.0000 | OROMUCOSAL | Status: DC | PRN

## 2019-10-12 MED ORDER — ALBUTEROL SULFATE HFA 108 (90 BASE) MCG/ACT IN AERS
INHALATION_SPRAY | RESPIRATORY_TRACT | Status: AC
  Filled 2019-10-12: qty 6.7

## 2019-10-12 MED ORDER — METHOCARBAMOL 1000 MG/10ML IJ SOLN
500.0000 mg | Freq: Four times a day (QID) | INTRAVENOUS | Status: DC | PRN
  Filled 2019-10-12: qty 5

## 2019-10-12 MED ORDER — HYDROCODONE-ACETAMINOPHEN 5-325 MG PO TABS
1.0000 | ORAL_TABLET | Freq: Four times a day (QID) | ORAL | Status: DC | PRN
  Filled 2019-10-12: qty 2

## 2019-10-12 MED ORDER — ROCURONIUM BROMIDE 10 MG/ML (PF) SYRINGE
PREFILLED_SYRINGE | INTRAVENOUS | Status: AC
  Filled 2019-10-12: qty 20

## 2019-10-12 MED ORDER — THROMBIN 20000 UNITS EX SOLR
CUTANEOUS | Status: DC | PRN
  Administered 2019-10-12: 16:00:00 via TOPICAL

## 2019-10-12 MED ORDER — MIDAZOLAM HCL 2 MG/2ML IJ SOLN
INTRAMUSCULAR | Status: AC
  Filled 2019-10-12: qty 2

## 2019-10-12 MED ORDER — MENTHOL 3 MG MT LOZG: 1.0000 | LOZENGE | OROMUCOSAL | Status: DC | PRN

## 2019-10-12 MED ORDER — OXYCODONE HCL 5 MG PO TABS: 5.0000 mg | ORAL_TABLET | Freq: Once | ORAL | Status: DC | PRN

## 2019-10-12 MED ORDER — DEXAMETHASONE SODIUM PHOSPHATE 10 MG/ML IJ SOLN
INTRAMUSCULAR | Status: AC
  Filled 2019-10-12: qty 1

## 2019-10-12 MED ORDER — DEXTROSE 5 % IV SOLN
3.0000 g | INTRAVENOUS | Status: AC
  Filled 2019-10-12: qty 3000

## 2019-10-12 MED ORDER — BACITRACIN ZINC 500 UNIT/GM EX OINT
TOPICAL_OINTMENT | CUTANEOUS | Status: DC | PRN
  Administered 2019-10-12: 1 via TOPICAL

## 2019-10-12 MED ORDER — CEFAZOLIN SODIUM-DEXTROSE 2-4 GM/100ML-% IV SOLN
INTRAVENOUS | Status: AC
  Filled 2019-10-12: qty 100

## 2019-10-12 MED ORDER — DEXAMETHASONE SODIUM PHOSPHATE 10 MG/ML IJ SOLN
INTRAMUSCULAR | Status: DC | PRN
  Administered 2019-10-12: 10 mg via INTRAVENOUS

## 2019-10-12 MED ORDER — AMLODIPINE BESYLATE 5 MG PO TABS
5.0000 mg | ORAL_TABLET | Freq: Every day | ORAL | Status: DC
  Administered 2019-10-13: 5 mg via ORAL
  Filled 2019-10-12: qty 1

## 2019-10-12 MED ORDER — MORPHINE SULFATE (PF) 2 MG/ML IV SOLN: 2.0000 mg | INTRAVENOUS | Status: DC | PRN

## 2019-10-12 MED ORDER — LIDOCAINE-EPINEPHRINE 1 %-1:100000 IJ SOLN
INTRAMUSCULAR | Status: DC | PRN
  Administered 2019-10-12: 10 mL

## 2019-10-12 MED ORDER — METHYLPREDNISOLONE ACETATE 80 MG/ML IJ SUSP
INTRAMUSCULAR | Status: AC
  Filled 2019-10-12: qty 1

## 2019-10-12 SURGICAL SUPPLY — 60 items
BENZOIN TINCTURE PRP APPL 2/3 (GAUZE/BANDAGES/DRESSINGS) ×2 IMPLANT
BIT DRILL VUEPOINT II (BIT) IMPLANT
BLADE CLIPPER SURG (BLADE) ×2 IMPLANT
BUR MATCHSTICK NEURO 3.0 LAGG (BURR) ×2 IMPLANT
BUR ROUND FLUTED 5 RND (BURR) ×1 IMPLANT
BUR ROUND FLUTED 5MM RND (BURR) ×1
CANISTER SUCT 3000ML PPV (MISCELLANEOUS) ×3 IMPLANT
CARTRIDGE OIL MAESTRO DRILL (MISCELLANEOUS) ×1 IMPLANT
CLOSURE WOUND 1/2 X4 (GAUZE/BANDAGES/DRESSINGS) ×1
COVER WAND RF STERILE (DRAPES) ×3 IMPLANT
DERMABOND ADVANCED (GAUZE/BANDAGES/DRESSINGS) ×2
DERMABOND ADVANCED .7 DNX12 (GAUZE/BANDAGES/DRESSINGS) IMPLANT
DIFFUSER DRILL AIR PNEUMATIC (MISCELLANEOUS) ×3 IMPLANT
DRAPE LAPAROTOMY 100X72 PEDS (DRAPES) ×3 IMPLANT
DRAPE MICROSCOPE LEICA (MISCELLANEOUS) ×1 IMPLANT
DRILL BIT VUEPOINT II (BIT) ×2
DRSG OPSITE POSTOP 4X8 (GAUZE/BANDAGES/DRESSINGS) ×2 IMPLANT
DURAPREP 6ML APPLICATOR 50/CS (WOUND CARE) ×3 IMPLANT
ELECT REM PT RETURN 9FT ADLT (ELECTROSURGICAL) ×3
ELECTRODE REM PT RTRN 9FT ADLT (ELECTROSURGICAL) ×1 IMPLANT
GLOVE BIO SURGEON STRL SZ8 (GLOVE) ×5 IMPLANT
GLOVE BIOGEL PI IND STRL 8 (GLOVE) ×1 IMPLANT
GLOVE BIOGEL PI IND STRL 8.5 (GLOVE) ×1 IMPLANT
GLOVE BIOGEL PI INDICATOR 8 (GLOVE) ×4
GLOVE BIOGEL PI INDICATOR 8.5 (GLOVE) ×4
GLOVE ECLIPSE 8.0 STRL XLNG CF (GLOVE) ×5 IMPLANT
GOWN STRL REUS W/ TWL LRG LVL3 (GOWN DISPOSABLE) IMPLANT
GOWN STRL REUS W/ TWL XL LVL3 (GOWN DISPOSABLE) IMPLANT
GOWN STRL REUS W/TWL 2XL LVL3 (GOWN DISPOSABLE) ×6 IMPLANT
GOWN STRL REUS W/TWL LRG LVL3 (GOWN DISPOSABLE) ×4
GOWN STRL REUS W/TWL XL LVL3 (GOWN DISPOSABLE) ×4
HEMOSTAT POWDER KIT SURGIFOAM (HEMOSTASIS) ×4 IMPLANT
HEMOSTAT SURGICEL 2X14 (HEMOSTASIS) ×2 IMPLANT
KIT BASIN OR (CUSTOM PROCEDURE TRAY) ×3 IMPLANT
KIT TURNOVER KIT B (KITS) ×3 IMPLANT
MARKER SKIN DUAL TIP RULER LAB (MISCELLANEOUS) ×3 IMPLANT
NDL HYPO 25X1 1.5 SAFETY (NEEDLE) ×1 IMPLANT
NDL SPNL 22GX3.5 QUINCKE BK (NEEDLE) ×1 IMPLANT
NEEDLE HYPO 25X1 1.5 SAFETY (NEEDLE) ×3 IMPLANT
NEEDLE SPNL 22GX3.5 QUINCKE BK (NEEDLE) ×3 IMPLANT
NS IRRIG 1000ML POUR BTL (IV SOLUTION) ×3 IMPLANT
OIL CARTRIDGE MAESTRO DRILL (MISCELLANEOUS) ×3
PACK LAMINECTOMY NEURO (CUSTOM PROCEDURE TRAY) ×3 IMPLANT
PATTIES SURGICAL .25X.25 (GAUZE/BANDAGES/DRESSINGS) IMPLANT
PATTIES SURGICAL .5 X.5 (GAUZE/BANDAGES/DRESSINGS) IMPLANT
PATTIES SURGICAL .5 X3 (DISPOSABLE) IMPLANT
ROD VUEPOINT 3.5X60 (Rod) ×2 IMPLANT
SCREW MA MM 3.5X14 (Screw) ×6 IMPLANT
SCREW SET THREADED (Screw) ×6 IMPLANT
SPONGE SURGIFOAM ABS GEL 100 (HEMOSTASIS) ×2 IMPLANT
STAPLER SKIN PROX WIDE 3.9 (STAPLE) ×3 IMPLANT
STRIP CLOSURE SKIN 1/2X4 (GAUZE/BANDAGES/DRESSINGS) ×1 IMPLANT
SUT VIC AB 0 CT1 18XCR BRD8 (SUTURE) ×1 IMPLANT
SUT VIC AB 0 CT1 8-18 (SUTURE) ×2
SUT VIC AB 2-0 CT1 18 (SUTURE) ×3 IMPLANT
SUT VIC AB 3-0 SH 8-18 (SUTURE) ×3 IMPLANT
TOWEL GREEN STERILE (TOWEL DISPOSABLE) ×3 IMPLANT
TOWEL GREEN STERILE FF (TOWEL DISPOSABLE) ×3 IMPLANT
TRAY FOLEY MTR SLVR 16FR STAT (SET/KITS/TRAYS/PACK) ×2 IMPLANT
WATER STERILE IRR 1000ML POUR (IV SOLUTION) ×3 IMPLANT

## 2019-10-12 NOTE — Anesthesia Postprocedure Evaluation (Signed)
Anesthesia Post Note  Patient: Scott Eaton  Procedure(s) Performed: Cervical Three-Five Posterior Cervical Fusion with Laminectomy and Resection of Tumor (N/A Spine Cervical)     Patient location during evaluation: PACU Anesthesia Type: General Level of consciousness: awake and alert and oriented Pain management: pain level controlled Vital Signs Assessment: post-procedure vital signs reviewed and stable Respiratory status: spontaneous breathing, nonlabored ventilation and respiratory function stable Cardiovascular status: blood pressure returned to baseline Postop Assessment: no apparent nausea or vomiting Anesthetic complications: no    Last Vitals:  Vitals:   10/12/19 1722 10/12/19 1737  BP: (!) 184/135 (!) 167/75  Pulse: 88   Resp: 14   Temp: (!) 36.2 C   SpO2: 93%     Last Pain:  Vitals:   10/12/19 1752  TempSrc:   PainSc: [place]

## 2019-10-12 NOTE — Op Note (Signed)
10/12/2019  5:20 PM  PATIENT:  Scott Eaton  43 y.o. male  PRE-OPERATIVE DIAGNOSIS:  Metastatic cancer to spine, Cervical stenosis with cord compression, spondylolisthesis, cervical myelopathy, cervicalgia    POST-OPERATIVE DIAGNOSIS:  Metastatic cancer to spine, Cervical stenosis with cord compression, spondylolisthesis, cervical myelopathy, cervicalgia   PROCEDURE:  Procedure(s) with comments: Cervical Three-Five Posterior Cervical Fusion with Laminectomy and Resection of Tumor (N/A) - Cervical 3-4-5 Posterior cervical fusion with laminectomy and resection of epidural tumor  SURGEON:  Surgeon(s) and Role:    Erline Levine, MD - Primary    * Jovita Gamma, MD - Assisting  PHYSICIAN ASSISTANT:  ASSISTANTS:  [place], RN  ANESTHESIA:   general  EBL:  500 mL   BLOOD ADMINISTERED:none  DRAINS: (Medium) Hemovact drain(s) in the epidural space with  Suction Open   LOCAL MEDICATIONS USED:  MARCAINE    and LIDOCAINE   SPECIMEN:  Excision  DISPOSITION OF SPECIMEN:  PATHOLOGY  COUNTS:  YES  TOURNIQUET:  * No tourniquets in log *  DICTATION: Patient is 43 year old man with metastatic cancer to the spine and cord compression and cervical myelopathy, cervical stenosis, and neck pain.  It was elected to take the patient to surgery for posterior cervical decompression and fusion. He is morbidly obese.  PROCEDURE: Patient was brought to the OR and following smooth and uncomplicated induction of GETA using Glide Scope, patient was placed in 3 pin fixation and rolled into a prone position on the OR table.   Posterior neck was shaved with clippers, prepped and draped in the usual fashion with betadine scrub followed by Duraprep.  Area of planned incision was infiltrated with local lidocaine.  Incision was made from C2-C7 and carried through the avascular midline plane to expose these lavels and their lateral masses.  There was highly vascular tumor on the left with destruction of left  C 3, C 4 lateral masses and laminae.  Lateral mass screws were placed from C3 to C5 on the right according to standard landmarks and their positioning was confirmed with fluoroscopy.  The facet joints and laminae were decorticated.  Screws and rod were locked down in situ.  The spinous processes of C3-C4 were then removed and the laminae were thinned [place]. A total laminectomy was performed with undercutting of the C3 lamina all the way to the top of C 5 and the epidural tumor was removed and the cord was decompressed. The tumor was sent as two specimens; the first was of infiltrated muscle and the second was of metastasis to bone, particularly over the spinal cord.  The spinal cord dura appeared to be pulsatile and well decompressed.  Hemostasis was assured with Surgifoam and a medium Hemovac drain was placed in the epidural space. Wound was closed with 0, 2-0, and 3-0 vicryl sutures. Sterile occlusive dressing was placed.  Patient was taken out of pins and turned back onto the OR table.  Patient was extubated in the operating room and taken to recovery in stable and satisfactory condition.  Counts were correct at the end of the case.  PLAN OF CARE: Admit to inpatient   PATIENT DISPOSITION:  PACU - hemodynamically stable.   Delay start of Pharmacological VTE agent (>24hrs) due to surgical blood loss or risk of bleeding: yes

## 2019-10-12 NOTE — Progress Notes (Signed)
Pt brought to PACU at 1716 from [place]. PACU RNs were informed that the patient did not need to be on any type of isolation precautions due to it being >10 days of him testing positive for COVID. After pt arrived, infection prevention RN contacted to notify that pt needed to be placed on airborne isolation due to his immunocompromised status. Attending MD and anesthesiologist notified. Pt transferred to a negative pressure room on 4N. 4N RN aware of situation.

## 2019-10-12 NOTE — Progress Notes (Signed)
Awake, alert, conversant.  Pain well controlled.  Strength full both arms and legs.  Patient is doing well.

## 2019-10-12 NOTE — Brief Op Note (Signed)
10/12/2019  5:20 PM  PATIENT:  Scott [facility]  43 y.o. male  PRE-OPERATIVE DIAGNOSIS:  Metastatic cancer to spine, Cervical stenosis with cord compression, spondylolisthesis, cervical myelopathy, cervicalgia    POST-OPERATIVE DIAGNOSIS:  Metastatic cancer to spine, Cervical stenosis with cord compression, spondylolisthesis, cervical myelopathy, cervicalgia   PROCEDURE:  Procedure(s) with comments: Cervical Three-Five Posterior Cervical Fusion with Laminectomy and Resection of Tumor (N/A) - Cervical 3-4-5 Posterior cervical fusion with laminectomy and resection of epidural tumor  SURGEON:  Surgeon(s) and Role:    Erline Levine, MD - Primary    * Jovita Gamma, MD - Assisting  PHYSICIAN ASSISTANT:  ASSISTANTS:  Poteat, RN  ANESTHESIA:   general  EBL:  500 mL   BLOOD ADMINISTERED:none  DRAINS: (Medium) Hemovact drain(s) in the epidural space with  Suction Open   LOCAL MEDICATIONS USED:  MARCAINE    and LIDOCAINE   SPECIMEN:  Excision  DISPOSITION OF SPECIMEN:  PATHOLOGY  COUNTS:  YES  TOURNIQUET:  * No tourniquets in log *  DICTATION: Patient is 43 year old man with metastatic cancer to the spine and cord compression and cervical myelopathy, cervical stenosis, and neck pain.  It was elected to take the patient to surgery for posterior cervical decompression and fusion. He is morbidly obese.  PROCEDURE: Patient was brought to the OR and following smooth and uncomplicated induction of GETA using Glide Scope, patient was placed in 3 pin fixation and rolled into a prone position on the OR table.   Posterior neck was shaved with clippers, prepped and draped in the usual fashion with betadine scrub followed by Duraprep.  Area of planned incision was infiltrated with local lidocaine.  Incision was made from C2-C7 and carried through the avascular midline plane to expose these lavels and their lateral masses.  There was highly vascular tumor on the left with destruction of left  C 3, C 4 lateral masses and laminae.  Lateral mass screws were placed from C3 to C5 on the right according to standard landmarks and their positioning was confirmed with fluoroscopy.  The facet joints and laminae were decorticated.  Screws and rod were locked down in situ.  The spinous processes of C3-C4 were then removed and the laminae were thinned Le[place]ell. A total laminectomy was performed with undercutting of the C3 lamina all the way to the top of C 5 and the epidural tumor was removed and the cord was decompressed. The tumor was sent as two specimens; the first was of infiltrated muscle and the second was of metastasis to bone, particularly over the spinal cord.  The spinal cord dura appeared to be pulsatile and well decompressed.  Hemostasis was assured with Surgifoam and a medium Hemovac drain was placed in the epidural space. Wound was closed with 0, 2-0, and 3-0 vicryl sutures. Sterile occlusive dressing was placed.  Patient was taken out of pins and turned back onto the OR table.  Patient was extubated in the operating room and taken to recovery in stable and satisfactory condition.  Counts were correct at the end of the case.  PLAN OF CARE: Admit to inpatient   PATIENT DISPOSITION:  PACU - hemodynamically stable.   Delay start of Pharmacological VTE agent (>24hrs) due to surgical blood loss or risk of bleeding: yes

## 2019-10-12 NOTE — Anesthesia Procedure Notes (Signed)
Arterial Line Insertion Start/End11/04/2019 12:15 PM, 10/12/2019 12:30 PM Performed by: Oletta Lamas, CRNA, CRNA  Preanesthetic checklist: patient identified, IV checked, site marked, risks and benefits discussed, surgical consent, monitors and equipment checked, pre-op evaluation, timeout performed and anesthesia consent Left, radial was placed Catheter size: 20 G Hand hygiene performed , maximum sterile barriers used  and Seldinger technique used Allen's test indicative of satisfactory collateral circulation Attempts: 1 Procedure performed without using ultrasound guided technique. Following insertion, dressing applied and Biopatch. Post procedure assessment: normal  Patient tolerated the procedure well with no immediate complications.

## 2019-10-12 NOTE — Transfer of Care (Signed)
Immediate Anesthesia Transfer of Care Note  Patient: Scott [facility]  Procedure(s) Performed: Cervical Three-Five Posterior Cervical Fusion with Laminectomy and Resection of Tumor (N/A Spine Cervical)  Patient Location: PACU  Anesthesia Type:General  Level of Consciousness: awake, alert , oriented and patient cooperative  Airway & Oxygen Therapy: Patient Spontanous Breathing  Post-op Assessment: Report given to RN and Post -op Vital signs reviewed and stable  Post vital signs: Reviewed and stable  Last Vitals:  Vitals Value Taken Time  BP    Temp    Pulse    Resp    SpO2      Last Pain:  Vitals:   10/12/19 1040  TempSrc:   PainSc: 0-No pain      Patients Stated Pain Goal: 3 ([id number])  Complications: No apparent anesthesia complications

## 2019-10-12 NOTE — Interval H&P Note (Signed)
History and Physical Interval Note:  10/12/2019 10:43 AM  Scott Eaton  has presented today for surgery, with the diagnosis of Cervical radiculopathy Metastatic cancer to spine Cervical stenosis.  The various methods of treatment have been discussed with the patient and family. After consideration of risks, benefits and other options for treatment, the patient has consented to  Procedure(s) with comments: Cervical 3-4 Posterior cervical fusion with laminectomy and resection of tumor (N/A) - Cervical 3-4 Posterior cervical fusion with laminectomy and resection of tumor as a surgical intervention.  The patient's history has been reviewed, patient examined, no change in status, stable for surgery.  I have reviewed the patient's chart and labs.  Questions were answered to the patient's satisfaction.     [name]

## 2019-10-13 ENCOUNTER — Ambulatory Visit: Payer: BC Managed Care – PPO

## 2019-10-13 ENCOUNTER — Encounter (HOSPITAL_COMMUNITY): Payer: Self-pay | Admitting: Neurosurgery

## 2019-10-13 LAB — HEPATITIS PANEL, ACUTE
HCV Ab: NONREACTIVE
Hep A IgM: NONREACTIVE
Hep B C IgM: NONREACTIVE
Hepatitis B Surface Ag: NONREACTIVE

## 2019-10-13 LAB — HIV ANTIBODY (ROUTINE TESTING W REFLEX): HIV Screen 4th Generation wRfx: NONREACTIVE

## 2019-10-13 MED ORDER — METHOCARBAMOL 500 MG PO TABS: 500.0000 mg | ORAL_TABLET | Freq: Four times a day (QID) | ORAL | 1 refills | Status: DC | PRN

## 2019-10-13 MED ORDER — HYDROCODONE-ACETAMINOPHEN 5-325 MG PO TABS: 1.0000 | ORAL_TABLET | Freq: Four times a day (QID) | ORAL | 0 refills | Status: DC | PRN

## 2019-10-13 MED FILL — Gelatin Absorbable MT Powder: OROMUCOSAL | Qty: 1 | Status: AC

## 2019-10-13 MED FILL — Thrombin For Soln 5000 Unit: CUTANEOUS | Qty: 5000 | Status: AC

## 2019-10-13 NOTE — Discharge Summary (Signed)
Physician Discharge Summary  Patient ID: Scott Eaton MRN: [id number] DOB/AGE: 1976-04-13 43 y.o.  Admit date: 10/12/2019 Discharge date: 10/13/2019  Admission Diagnoses:Metastatic cancer to spine, Cervical stenosis with cord compression, spondylolisthesis, cervical myelopathy, cervicalgia     Discharge Diagnoses: Metastatic cancer to spine, Cervical stenosis with cord compression, spondylolisthesis, cervical myelopathy, cervicalgia s/p Cervical Three-Five Posterior Cervical Fusion with Laminectomy and Resection of Tumor (N/A) - Cervical 3-4-5 Posterior cervical fusion with laminectomy and resection of epidural tumor       Active Problems:   Metastatic cancer to spine Scott Eaton)   Status post cervical spinal fusion   Discharged Condition: stable  [facility] Course: Scott [facility]aton was admitted for surgery with cord compression secondary to metastatic cancer to cervical spine. Following uncomplicated surgery (above), he recovered nicely and transferred to Neuro ICU for nursing care. He is mobilizing well with good pain control.  Consults: None  Significant Diagnostic Studies: radiology: X-Ray: intra-op  Treatments: surgery: Cervical Three-Five Posterior Cervical Fusion with Laminectomy and Resection of Tumor (N/A) - Cervical 3-4-5 Posterior cervical fusion with laminectomy and resection of epidural tumor     Discharge Exam: Blood pressure 135/81, pulse 80, temperature 98.9 F (37.2 C), resp. rate 12, height 6\' 1"  (1.854 m), weight (!) 152 kg, SpO2 96 %. Alert, conversant, sitting in chair. Aspen collar in use. Good strength BUE. Decreased numbness. Incision without erythema, swelling, or drainage beneath honeycomb and Dermabond. Pain is well-controlled.  Hemovac patent, >145ml overnight, but appears to be drying in tube now.    Hemovac ~74ml over last 3 hours; ok to d/c per Dr. Vertell Limber. D/C to home. Aspen collar except showers. Ok to remove dressings next 1-2 days. Ok to shower. Oncology  f/u already scheduled. Office f/u with Dr. Vertell Limber in 1 month. Robaxin 500mg  and Norco 5/325 Rx's eRx'ed for prn home use. Stop steroid    Disposition: Discharge disposition: 01-Home or Self Care  Pt has been accepted into the oncology program at [facility] with appt next week. Office f/u with Dr. Vertell Limber in 3-4 weeks. Norco and Robaxin eRx'ed for prn home use       Discharge Instructions    Incentive spirometry RT   Complete by: As directed      Allergies as of 10/13/2019   No Known Allergies     Medication List    STOP taking these medications   dexamethasone 4 MG tablet Commonly known as: DECADRON   ibuprofen 200 MG tablet Commonly known as: ADVIL     TAKE these medications   acetaminophen 500 MG tablet Commonly known as: TYLENOL Take 1,000 mg by mouth every 6 (six) hours as needed for moderate pain.   amLODipine 5 MG tablet Commonly known as: NORVASC Take 1 tablet (5 mg total) by mouth daily.   ascorbic acid 500 MG tablet Commonly known as: VITAMIN C Take 1 tablet (500 mg total) by mouth 2 (two) times daily.   cyclobenzaprine 10 MG tablet Commonly known as: FLEXERIL Take 1 tablet (10 mg total) by mouth 3 (three) times daily as needed for muscle spasms.   fexofenadine 180 MG tablet Commonly known as: ALLEGRA Take 180 mg by mouth daily.   HYDROcodone-acetaminophen 5-325 MG tablet Commonly known as: NORCO/VICODIN Take 1-2 tablets by mouth every 6 (six) hours as needed for moderate pain.   methocarbamol 500 MG tablet Commonly known as: ROBAXIN Take 1 tablet (500 mg total) by mouth every 6 (six) hours as needed for muscle spasms.   pantoprazole 40 MG tablet Commonly  known as: PROTONIX Take 1 tablet (40 mg total) by mouth daily.   phentermine 37.5 MG tablet Commonly known as: ADIPEX-P Take 37.5 mg by mouth daily.   zinc sulfate 220 (50 Zn) MG capsule Take 1 capsule (220 mg total) by mouth daily.        Signed: Verdis Prime 10/13/2019, 1:13 PM     Patient is doing well.  Discharge home.

## 2019-10-13 NOTE — Care Management (Signed)
OT recommending outpatient follow up services. Spoke with pt's wife, Kenney Houseman:  They would prefer [place] or [place] area, as pt lives in [place], [place].  Referral made to [facility] for OP OT services.  Information placed on AVS in Epic.  Reinaldo Raddle, RN, BSN  Trauma/Neuro ICU Case Manager [phone]

## 2019-10-13 NOTE — Evaluation (Signed)
Occupational Therapy Evaluation Patient Details Name: Scott [facility] MRN: [id number] DOB: 24-Aug-1976 Today's Date: 10/13/2019    History of Present Illness 43 yo admitted with metastatic CA to spine with cord compression s/p C3-5 fusion with tumor resection. PMHx:obesity, covid 19 10/04/19   Clinical Impression   Pt PTA: Pt living with spouse and independent; working. Pt currently ambulatory with no AD and supervision to minguardA for transfers. Pt performing ADL tasks set-upA for UB and minguardA for LB ADL. Pt limited by weakness, decreased coordination in hands and pain. Pt able to perform figure 4 technique for donning doffing. Pt describes that his spouse can assist as needed. AE education provided.  Cervical handout provided and reviewed ADL in detail. Pt educated on: set an alarm at night for medication, correct bed positioning for sleeping, correct sequence for bed mobility, avoiding lifting more than 5 pounds and never wash directly over incision. All education is complete and patient indicates understanding. Pt's spouse will be home 24/7. No LOB episodes. Pt would benefit from continued OT skilled services in OP OT setting for ADL and BUE strength and coordination. OT signing off.  BP: 155/83 after exertion. O2 >90% HR 80 BPM with exertion.      Follow Up Recommendations  Outpatient OT;Supervision - Intermittent    Equipment Recommendations  None recommended by OT    Recommendations for Other Services       Precautions / Restrictions Precautions Precautions: Cervical Precaution Booklet Issued: Yes (comment) Precaution Comments: verbal discussion of precautions Required Braces or Orthoses: Cervical Brace Restrictions Weight Bearing Restrictions: No      Mobility Bed Mobility Overal bed mobility: Needs Assistance Bed Mobility: Rolling;Sidelying to Sit Rolling: Supervision Sidelying to sit: Supervision       General bed mobility comments: + use of  rail  Transfers Overall transfer level: Needs assistance Equipment used: Rolling walker (2 wheeled);None Transfers: Sit to/from [facility] Sit to Stand: Supervision Stand pivot transfers: Min guard       General transfer comment: Attempting mobility with RW, but pt steady without RW. Pt transfering with no AD    Balance Overall balance assessment: Needs assistance   Sitting balance-Leahy Scale: Fair       Standing balance-Leahy Scale: Fair                             ADL either performed or assessed with clinical judgement   ADL Overall ADL's : Needs assistance/impaired Eating/Feeding: Modified independent;Sitting   Grooming: Supervision/safety;Standing   Upper Body Bathing: Set up;Sitting   Lower Body Bathing: Min guard;Sitting/lateral leans;Sit to/from stand;Cueing for safety   Upper Body Dressing : Set up;Sitting   Lower Body Dressing: Min guard;Sitting/lateral leans;Sit to/from stand;Cueing for safety   Toilet Transfer: Min guard   Toileting- Clothing Manipulation and Hygiene: Min guard;Sitting/lateral lean;Sit to/from stand;Cueing for safety       Functional mobility during ADLs: Supervision/safety;Min guard;Cueing for safety General ADL Comments: Pt limited by weakness, decreased coordination in hands and pain. Pt able to perform figure 4 technique for donning doffing. Pt describes that his spouse can assist as needed. AE education provided.     Vision Baseline Vision/History: No visual deficits Vision Assessment?: No apparent visual deficits     Perception     Praxis      Pertinent Vitals/Pain Pain Assessment: 0-10 Pain Score: 8  Pain Location: posterior neck Pain Descriptors / Indicators: Discomfort;Grimacing Pain Intervention(s): Repositioned;RN gave pain meds during  session     Hand Dominance Right   Extremity/Trunk Assessment Upper Extremity Assessment Upper Extremity Assessment: Generalized weakness;RUE  deficits/detail;LUE deficits/detail RUE Deficits / Details: weakness 3/5 MM grade and grip strength 4/5 MM grade; continues to have decreased sensations and hands feel "heavy" RUE Sensation: decreased light touch RUE Coordination: decreased fine motor;decreased gross motor LUE Deficits / Details: weakness 3/5 MM grade and grip strength 4/5 MM grade; continues to have decreased sensations and hands feel "heavy" LUE Sensation: decreased light touch LUE Coordination: decreased fine motor;decreased gross motor   Lower Extremity Assessment Lower Extremity Assessment: Overall WFL for tasks assessed   Cervical / Trunk Assessment Cervical / Trunk Assessment: Other exceptions Cervical / Trunk Exceptions: s/p cervical sx   Communication Communication Communication: No difficulties   Cognition Arousal/Alertness: Awake/alert Behavior During Therapy: Agitated;WFL for tasks assessed/performed Overall Cognitive Status: Within Functional Limits for tasks assessed                                 General Comments: Pt stating that he was frustrated with his care he is receiving. By the time, this OTR had finished, pt was calm and appreciative of RN and OT.   General Comments  Pt agreeable to OP OT for UB strength and coordination.    Exercises Exercises: Other exercises Other Exercises Other Exercises: hand squeezes   Shoulder Instructions      Home Living Family/patient expects to be discharged to:: Private residence Living Arrangements: Spouse/significant other Available Help at Discharge: Family;Available 24 hours/day Type of Home: House Home Access: Stairs to enter [employer] of Steps: 1   Home Layout: Two level;Able to live on main level with bedroom/bathroom Alternate Level Stairs-Number of Steps: full flight   Bathroom Shower/Tub: Teacher, early years/pre: Handicapped height     Home Equipment: None   Additional Comments: Pt stated "I'll buy  a shower chair if I feel like I need it."      Prior Functioning/Environment Level of Independence: Independent                 OT Problem List: Decreased activity tolerance;Pain;Impaired UE functional use      OT Treatment/Interventions:      OT Goals(Current goals can be found in the care plan section) Acute Rehab OT Goals Patient Stated Goal: to go home  OT Frequency:     Barriers to D/C:            Co-evaluation              AM-PAC OT "6 Clicks" Daily Activity     Outcome Measure Help from another person eating meals?: None Help from another person taking care of personal grooming?: None Help from another person toileting, which includes using toliet, bedpan, or urinal?: A Little Help from another person bathing (including washing, rinsing, drying)?: A Little Help from another person to put on and taking off regular upper body clothing?: None Help from another person to put on and taking off regular lower body clothing?: A Little 6 Click Score: 21   End of Session Equipment Utilized During Treatment: Gait belt;Rolling walker;Cervical collar Nurse Communication: Mobility status  Activity Tolerance: Patient tolerated treatment well Patient left: in chair;with call bell/phone within reach;with nursing/sitter in room  OT Visit Diagnosis: Unsteadiness on feet (R26.81);Muscle weakness (generalized) (M62.81)                Time:  [id number] OT Time Calculation (min): 70 min Charges:  OT General Charges $OT Visit: 1 Visit OT Evaluation $OT Eval Moderate Complexity: 1 Mod OT Treatments $Self Care/Home Management : 38-52 mins $Therapeutic Activity: 8-22 mins  Ebony Hail Harold Hedge) Marsa Aris OTR/L Acute Rehabilitation Services Pager: [phone] Office: [place] 10/13/2019, 11:06 AM

## 2019-10-13 NOTE — Progress Notes (Signed)
Patient ID: Jc Gindhart, male   DOB: Aug 09, 1976, 42 y.o.   MRN: [id number] Hemovac ~67ml over last 3 hours; ok to d/c per Dr. Vertell Limber. D/C to home.  Aspen collar except showers. Ok to remove dressings next 1-2 days. Ok to shower. Oncology f/u already scheduled. Office f/u with Dr. Vertell Limber in 1 month. Robaxin 500mg  and Norco 5/325 Rx's eRx'ed for prn home use. Stop steroid.

## 2019-10-13 NOTE — Progress Notes (Signed)
PT Cancellation Note  Patient Details Name: Scott [facility] MRN: [id number] DOB: [birth year]   Cancelled Treatment:    Reason Eval/Treat Not Completed: PT screened, no needs identified, will sign off(pt mobilized with OT and per OT no P.T. needs at this time. will sign off)   Scott Eaton B Scott [facility] 10/13/2019, 10:17 AM  Scott [facility], PT Acute Rehabilitation Services Pager: [phone] Office: [phone]

## 2019-10-13 NOTE — Progress Notes (Signed)
Subjective: Patient reports "I'm doing pretty good now that I'm up out of bed"  Objective: Vital signs in last 24 hours: Temp:  [97.2 F (36.2 C)-98.9 F (37.2 C)] 98.9 F (37.2 C) (11/06 0800) Pulse Rate:  [65-106] 91 (11/06 0900) Resp:  [13-25] 18 (11/06 1000) BP: (134-184)/(67-135) 138/79 (11/06 1000) SpO2:  [92 %-100 %] 100 % (11/06 1000) Arterial Line BP: (163-207)/(67-86) 172/76 (11/06 0900)  Intake/Output from previous day: 11/05 0701 - 11/06 0700 In: 1250 [I.V.:1200; IV Piggyback:50] Out: [id number] [Urine:3050; Drains:275; Blood:500] Intake/Output this shift: No intake/output data recorded.  Alert, conversant, sitting in chair. Aspen collar in use. Good strength BUE. Decreased numbness. Incision without erythema, swelling, or drainage beneath honeycomb and Dermabond. Pain is well-controlled.  Hemovac patent, >128ml overnight, but appears to be drying in tube now.   Lab Results: No results for input(s): WBC, HGB, HCT, PLT in the last 72 hours. BMET No results for input(s): NA, K, CL, CO2, GLUCOSE, BUN, CREATININE, CALCIUM in the last 72 hours.  Studies/Results: Dg Cervical Spine 2-3 Views  Result Date: 10/12/2019 CLINICAL DATA:  43 year old male with a history of cervical fixation EXAM: CERVICAL SPINE - 2-3 VIEW; DG C-ARM 1-60 MIN COMPARISON:  None. FINDINGS: Limited intraoperative fluoroscopic spot images, demonstrating cross-table lateral the cervical spine during cervical fusion. IMPRESSION: Limited intraoperative cross-table lateral the cervical spine for localization. Please refer to the dictated operative report for full details of intraoperative findings and procedure. Electronically Signed   By: Corrie Mckusick D.O.   On: 10/12/2019 17:03   Dg C-arm 1-60 Min  Result Date: 10/12/2019 CLINICAL DATA:  43 year old male with a history of cervical fixation EXAM: CERVICAL SPINE - 2-3 VIEW; DG C-ARM 1-60 MIN COMPARISON:  None. FINDINGS: Limited intraoperative fluoroscopic spot  images, demonstrating cross-table lateral the cervical spine during cervical fusion. IMPRESSION: Limited intraoperative cross-table lateral the cervical spine for localization. Please refer to the dictated operative report for full details of intraoperative findings and procedure. Electronically Signed   By: Corrie Mckusick D.O.   On: 10/12/2019 17:03    Assessment/Plan: Doing well 1st day post-op.  LOS: 1 day  Per Dr. Vertell Limber, d/c A-Line. If no more than 66ml Hemovac over next couple hours, ok to d/c hemovac. Mobilize as tolerated. Also spoke with pt's wife with report of his current status. Likely home this evening or tomorrow am. Pt has been accepted into the oncology program at [facility] with appt next week. Office f/u with Dr. Vertell Limber in 3-4 weeks.    Verdis Prime 10/13/2019, 10:48 AM

## 2019-10-13 NOTE — Progress Notes (Signed)
Pt has voided since urethral catheter removed and ambulated in room with minimal assistance required. Pt VSS and no complaints of pain. All discharge instructions have been reviewed with patient and all questions answered at this time. Pt understands that his Rx are waiting at his pharmacy in [place] and that he will have a follow up visit with Dr. Vertell Limber within a month. Pt will go home with an incentive spirometer and aspen collar and has been instructed on how to use/ properly align the device. All pt belongings have been returned including pt's cell phone, clothing and shoes. All PIVs have been removed. Pt waiting for spouse to arrive to [facility] as she is his ride home.

## 2019-10-13 NOTE — Plan of Care (Signed)
Pt education adequate for discharge, understands that he will have follow-up visit with Dr. Vertell Limber within 1 month.

## 2019-10-15 ENCOUNTER — Telehealth: Payer: Self-pay | Admitting: Radiation Oncology

## 2019-10-15 NOTE — Telephone Encounter (Signed)
The patients wife called over the weekend and we discussed some of he concerns about a bad interaction with hospital staff during his hospital stay. She was pleased though at how well he seems to be doing. The incision is looking well and he was able to shower. We will follow up with path and chromogranin a levels when available.

## 2019-10-16 ENCOUNTER — Ambulatory Visit: Payer: BC Managed Care – PPO

## 2019-10-16 ENCOUNTER — Telehealth: Payer: Self-pay | Admitting: Radiation Oncology

## 2019-10-16 ENCOUNTER — Inpatient Hospital Stay: Payer: BC Managed Care – PPO | Attending: Radiation Oncology

## 2019-10-16 LAB — CHROMOGRANIN A: Chromogranin A (ng/mL): 257.8 ng/mL — ABNORMAL HIGH (ref 0.0–101.8)

## 2019-10-16 NOTE — Telephone Encounter (Signed)
I called and let the patient and his wife know about chromogranin A results being 257.8, [facility] performed the testing and the range is 0-101/8 ng/mL. We will forward these results to Dr. Shaaron Adler at [facility] and I've released them to the patient via mychart.

## 2019-10-17 ENCOUNTER — Ambulatory Visit: Payer: BC Managed Care – PPO

## 2019-10-17 LAB — SURGICAL PATHOLOGY

## 2019-10-19 ENCOUNTER — Encounter (HOSPITAL_COMMUNITY): Payer: Self-pay | Admitting: Occupational Therapy

## 2019-10-19 ENCOUNTER — Ambulatory Visit (HOSPITAL_COMMUNITY): Payer: BC Managed Care – PPO | Attending: Neurosurgery | Admitting: Occupational Therapy

## 2019-10-19 ENCOUNTER — Other Ambulatory Visit: Payer: Self-pay

## 2019-10-19 DIAGNOSIS — M542 Cervicalgia: Secondary | ICD-10-CM | POA: Diagnosis present

## 2019-10-19 DIAGNOSIS — R29898 Other symptoms and signs involving the musculoskeletal system: Secondary | ICD-10-CM | POA: Insufficient documentation

## 2019-10-19 DIAGNOSIS — R278 Other lack of coordination: Secondary | ICD-10-CM | POA: Insufficient documentation

## 2019-10-19 DIAGNOSIS — Z981 Arthrodesis status: Secondary | ICD-10-CM | POA: Diagnosis not present

## 2019-10-19 NOTE — Therapy (Addendum)
[place], [place], [address] Phone: [phone]   Fax:  [phone]  Occupational Therapy Evaluation  Patient Details  Name: Scott Eaton MRN: [id number] Date of Birth: 1976/11/22 Referring Provider (OT): Dr. Erline Levine   Encounter Date: 10/19/2019  OT End of Session - 10/19/19 1243    Visit Number  1    Number of Visits  12    Date for OT Re-Evaluation  11/30/19   mini-reassessment 11/16/19   Authorization Type  [employer] PPO    Authorization Time Period  60 visit limit PT/OT, 0 used; $20 copay    Authorization - Visit Number  1    Authorization - Number of Visits  60    OT Start Time  [id number]    OT Stop Time  1113    OT Time Calculation (min)  41 min    Activity Tolerance  Patient tolerated treatment well    Behavior During Therapy  [facility] for tasks assessed/performed       Past Medical History:  Diagnosis Date  . Cervical radiculopathy    cervical stenosis  . Complication of anesthesia    They had a problem putting pt to sleep  . Obesity     Past Surgical History:  Procedure Laterality Date  . LAMINECTOMY N/A 10/12/2019   Procedure: Cervical Three-Five Posterior Cervical Fusion with Laminectomy and Resection of Tumor;  Surgeon: Erline Levine, MD;  Location: [place];  Service: Neurosurgery;  Laterality: N/A;  Cervical 3-4 Posterior cervical fusion with laminectomy and resection of tumor  . TONSILLECTOMY    . WISDOM TOOTH EXTRACTION      There were no vitals filed for this visit.  Subjective Assessment - 10/19/19 1237    Subjective   S: I'm having trouble with my coordination and handwriting.    Pertinent History  Pt is a 43 y/o male s/p cervical fusion C3-C5 and metastatic tumor removal on 10/12/19 presenting with BUE deficits during functional use. Pt reports MD has instructed him not to wear cervical brace/collar unless absolutely necessary and to complete tasks as he normally would. Pt has been referred  to occupational therapy for evaluation and treatment by Dr. Erline Levine.    Special Tests  Complete FOTO and DASH next session    Patient Stated Goals  To have improved coordination and handwriting to return to work.    Currently in Pain?  No/denies        [facility] OT Assessment - 10/19/19 1030      Assessment   Medical Diagnosis  s/p cervical fusion C3-C5    Referring Provider (OT)  Dr. Erline Levine    Onset Date/Surgical Date  10/12/19    Hand Dominance  Right    Next MD Visit  not sure    Prior Therapy  Acute OT      Precautions   Precautions  Cervical    Precaution Comments  C3-C5 fusion      Restrictions   Weight Bearing Restrictions  No      Balance Screen   Has the patient fallen in the past 6 months  Yes    How many times?  1    Has the patient had a decrease in activity level because of a fear of falling?   No    Is the patient reluctant to leave their home because of a fear of falling?   No      Prior Function   Level of  Independence  Independent    Vocation  Full time employment    Vocation Requirements  Works at [employer] job    Leisure  play pool, golf, spending time with son (high school age)      ADL   ADL comments  Pt is having difficulty with opening water bottles, writing, reaching up or behind back, tying, operating cell phone      Written Expression   Dominant Hand  Right    Handwriting  100% legible   decreased dexterity and activity tolerance     Cognition   Overall Cognitive Status  Within Functional Limits for tasks assessed      Observation/Other Assessments   Focus on Therapeutic Outcomes (FOTO)   Complete next session      Coordination   9 Hole Peg Test  Right;Left    Right 9 Hole Peg Test  30.66"    Left 9 Hole Peg Test  35.43"      ROM / Strength   AROM / PROM / Strength  AROM;Strength      Palpation   Palpation comment  mod fascial restrictions in bilateral trapezius and scapular regions      AROM   Overall AROM Comments   Assessed seated, er/IR adducted     AROM Assessment Site  Shoulder;Cervical    Right/Left Shoulder  Right;Left    Right Shoulder Flexion  120 Degrees    Right Shoulder ABduction  117 Degrees    Right Shoulder Internal Rotation  90 Degrees    Right Shoulder External Rotation  54 Degrees    Left Shoulder Flexion  94 Degrees    Left Shoulder ABduction  67 Degrees    Left Shoulder Internal Rotation  90 Degrees    Left Shoulder External Rotation  69 Degrees    Cervical Flexion  58    Cervical Extension  15    Cervical - Right Side Bend  13    Cervical - Left Side Bend  22    Cervical - Right Rotation  22    Cervical - Left Rotation  23      Strength   Overall Strength Comments  Assessed seated, er/IR adducted    Strength Assessment Site  Shoulder;Hand    Right/Left Shoulder  Right;Left    Right Shoulder Flexion  3/5    Right Shoulder ABduction  3/5    Right Shoulder Internal Rotation  3+/5    Right Shoulder External Rotation  3/5    Left Shoulder Flexion  3-/5    Left Shoulder ABduction  3-/5    Left Shoulder Internal Rotation  3/5    Left Shoulder External Rotation  3/5    Right/Left hand  Right;Left    Right Hand Grip (lbs)  75    Right Hand Lateral Pinch  24 lbs    Right Hand 3 Point Pinch  16 lbs    Left Hand Grip (lbs)  60    Left Hand Lateral Pinch  16 lbs    Left Hand 3 Point Pinch  12 lbs                      OT Education - 10/19/19 1239    Education Details  red theraputty, cervical A/ROM    Person(s) Educated  Patient    Methods  Explanation;Demonstration;Handout    Comprehension  Verbalized understanding;Returned demonstration       OT Short Term Goals - 10/19/19 1247  OT SHORT TERM GOAL #1   Title  Pt will be provided with and educated on HEP to improve BUE use during ADL completion.    Time  3    Period  Weeks    Status  New    Target Date  11/09/19      OT SHORT TERM GOAL #2   Title  Pt will improve A/ROM of BUE to [facility] to increase  ability to perform dressing tasks with increased independence.    Time  3    Period  Weeks    Status  New        OT Long Term Goals - 10/19/19 1254      OT LONG TERM GOAL #1   Title  Pt will decrease fascial restrictions in bilateral trapezius and scapular regions to minimal amounts or less to improve ability to sleep at night.    Time  6    Period  Weeks    Status  New    Target Date  11/30/19      OT LONG TERM GOAL #2   Title  Pt will increase BUE strength to 4/5 or greater to improve ability to perform lightweight lifting tasks independently.    Time  6    Period  Weeks    Status  New      OT LONG TERM GOAL #3   Title  Pt will increase grip strength in bilateral hands by at least 10#, pinch strength in left hand by 3# to improve ability to open bottles and jars.    Time  6    Period  Weeks    Status  New      OT LONG TERM GOAL #4   Title  Pt will improve fine motor coordination in bilateral hands by completing 9 hole peg test in under 26" to improve dexterity required for handwriting tasks.    Time  6    Period  Weeks    Status  New      OT LONG TERM GOAL #5   Title  Pt will improve activity tolerance in bilateral hands to increase ability to perform sustained writing and/or typing tasks required for return to work.    Time  6    Period  Weeks    Status  New            Plan - 10/19/19 1243    Clinical Impression Statement  A: Pt is a 43 y/o male s/p cervical fusion C3-C5 and metastatic tumor removal on 10/12/19. Pt reports loss of sensation and strength in BUE, L>R, prior to sx however notes improvement in ability to complete ADLs since sx. Pt is concerned about returning to work which will require writing and typing.    OT Occupational Profile and History  Problem Focused Assessment - Including review of records relating to presenting problem    Occupational performance deficits (Please refer to evaluation for details):  ADL's;IADL's;Rest and Sleep;Work;Leisure     Body Structure / Function / Physical Skills  ADL;Muscle spasms;UE functional use;Fascial restriction;Pain;Flexibility;ROM;Coordination;Sensation;IADL;Strength    Rehab Potential  Good    Clinical Decision Making  Limited treatment options, no task modification necessary    Comorbidities Affecting Occupational Performance:  None    Modification or Assistance to Complete Evaluation   No modification of tasks or assist necessary to complete eval    OT Frequency  2x / week    OT Duration  6 weeks    OT Treatment/Interventions  Self-care/ADL training;Patient/family education;Passive range of motion;Cryotherapy;Electrical Stimulation;Moist Heat;Therapeutic exercise;Manual Therapy;Therapeutic activities    Plan  P: Pt will benefit from skilled OT services to decrease pain, edema, fascial restrictions and increase BUE ROM, strength, and functional use during daily tasks. Treatment plan: myofascial release and manual techniques, P/ROM, BUE strengthening, grip and pinch strengthening, fine motor coordination tasks.    Consulted and Agree with Plan of Care  Patient;Family member/caregiver    Family Member Consulted  wife       Patient will benefit from skilled therapeutic intervention in order to improve the following deficits and impairments:   Body Structure / Function / Physical Skills: ADL, Muscle spasms, UE functional use, Fascial restriction, Pain, Flexibility, ROM, Coordination, Sensation, IADL, Strength       Visit Diagnosis: S/P cervical spinal fusion - Plan: Ot plan of care cert/re-cert  Cervicalgia - Plan: Ot plan of care cert/re-cert  Other symptoms and signs involving the musculoskeletal system - Plan: Ot plan of care cert/re-cert  Other lack of coordination - Plan: Ot plan of care cert/re-cert    Problem List Patient Active Problem List   Diagnosis Date Noted  . Status post cervical spinal fusion 10/12/2019  . Elevated BP without diagnosis of hypertension 10/05/2019  .  COVID-19 virus infection 10/04/2019  . Leukocytosis 10/04/2019  . Metastatic cancer to spine [facility]) 10/02/2019   Guadelupe Sabin, OTR/L  [phone] 10/19/2019, 1:38 PM  [facility] [address] [place], [place], [address] Phone: [phone]   Fax:  [phone]  Name: Scott Eaton MRN: [id number] Date of Birth: 02-Dec-1976

## 2019-10-19 NOTE — Patient Instructions (Signed)
Home Exercises Program Theraputty Exercises  Do the following exercises 3 times a day using your affected hand.  1. Roll putty into a ball.  2. Make into a pancake.  3. Roll putty into a roll.  4. Pinch along log with first finger and thumb.   5. Make into a ball.  6. Roll it back into a log.   7. Pinch using thumb and side of first finger.  8. Roll into a ball, then flatten into a pancake.  9. Using your fingers, make putty into a mountain.    Cervical ROM Exercises  AROM: Lateral Neck Flexion   Slowly tilt head toward one shoulder, then the other.  Repeat _10___ times per set. Do __1__ sets per session. Do __2-3__ sessions per day.  http://orth.exer.us/296   Copyright  VHI. All rights reserved.  AROM: Neck Extension   Bend head backward.  Repeat __10__ times per set. Do __1__ sets per session. Do _2-3___ sessions per day.  http://orth.exer.us/300   Copyright  VHI. All rights reserved.  AROM: Neck Flexion   Bend head forward.  Repeat __10__ times per set. Do _1___ sets per session. Do __2-3__ sessions per day.  http://orth.exer.us/298   Copyright  VHI. All rights reserved.  AROM: Neck Rotation   Turn head slowly to look over one shoulder, then the other.  Repeat __10__ times per set. Do ___1_ sets per session. Do __2-3__ sessions per day.  http://orth.exer.us/294   Copyright  VHI. All rights reserved.

## 2019-10-20 ENCOUNTER — Ambulatory Visit (HOSPITAL_COMMUNITY): Payer: BC Managed Care – PPO | Admitting: Occupational Therapy

## 2019-10-20 ENCOUNTER — Encounter (HOSPITAL_COMMUNITY): Payer: Self-pay | Admitting: Occupational Therapy

## 2019-10-20 DIAGNOSIS — Z981 Arthrodesis status: Secondary | ICD-10-CM | POA: Diagnosis not present

## 2019-10-20 DIAGNOSIS — M542 Cervicalgia: Secondary | ICD-10-CM

## 2019-10-20 DIAGNOSIS — R278 Other lack of coordination: Secondary | ICD-10-CM

## 2019-10-20 DIAGNOSIS — R29898 Other symptoms and signs involving the musculoskeletal system: Secondary | ICD-10-CM

## 2019-10-20 NOTE — Therapy (Signed)
[place], [place], [address] Phone: [phone]   Fax:  [phone]  Occupational Therapy Treatment  Patient Details  Name: Scott Eaton MRN: [id number] Date of Birth: 07/03/1976 Referring Provider (OT): Dr. Erline Levine   Encounter Date: 10/20/2019  OT End of Session - 10/20/19 1733    Visit Number  2    Number of Visits  12    Date for OT Re-Evaluation  11/30/19   mini-reassessment 11/16/19   Authorization Type  [employer] PPO    Authorization Time Period  60 visit limit PT/OT, 0 used; $20 copay    Authorization - Visit Number  2    Authorization - Number of Visits  60    OT Start Time  1645    OT Stop Time  1728    OT Time Calculation (min)  43 min    Activity Tolerance  Patient tolerated treatment well    Behavior During Therapy  [facility] for tasks assessed/performed       Past Medical History:  Diagnosis Date  . Cervical radiculopathy    cervical stenosis  . Complication of anesthesia    They had a problem putting pt to sleep  . Obesity     Past Surgical History:  Procedure Laterality Date  . LAMINECTOMY N/A 10/12/2019   Procedure: Cervical Three-Five Posterior Cervical Fusion with Laminectomy and Resection of Tumor;  Surgeon: Erline Levine, MD;  Location: [place];  Service: Neurosurgery;  Laterality: N/A;  Cervical 3-4 Posterior cervical fusion with laminectomy and resection of tumor  . TONSILLECTOMY    . WISDOM TOOTH EXTRACTION      There were no vitals filed for this visit.  Subjective Assessment - 10/20/19 1642    Subjective   S: My arms and hands feel like they're thawing out.    Currently in Pain?  No/denies         [facility] OT Assessment - 10/20/19 1642      Assessment   Medical Diagnosis  s/p cervical fusion C3-C5      Precautions   Precautions  Cervical    Precaution Comments  C3-C5 fusion               OT Treatments/Exercises (OP) - 10/20/19 1647      Exercises   Exercises  Shoulder;Hand      Shoulder Exercises: Supine   Protraction  PROM;5 reps;AROM;10 reps;Both    Horizontal ABduction  PROM;5 reps;AROM;10 reps;Both    External Rotation  PROM;5 reps;AROM;10 reps;Both    Internal Rotation  PROM;5 reps;AROM;10 reps;Both    Flexion  PROM;5 reps;AROM;10 reps;Both    ABduction  PROM;5 reps;AROM;10 reps;Both      Shoulder Exercises: ROM/Strengthening   Proximal Shoulder Strengthening, Supine  10X each, no rest breaks      Hand Exercises   Hand Gripper with Large Beads  right: all beads 55#, left all beads 42#   right vertical, left horizontal    Hand Gripper with Medium Beads  right: all beads gripper at 55#; left all beads gripper at 42#      Manual Therapy   Manual Therapy  Myofascial release    Manual therapy comments  completed separately from therapeutic exercises    Myofascial Release  myofascial release to bilateral trapezius and scapular regions to decrease pain and fascial restrictions and increase joint ROM      Fine Motor Coordination (Hand/Wrist)   Fine Motor Coordination  Manipulating coins  Manipulating coins  Pt working on palm to fingertip translation while moving coins to place into slotted container. Pt using right hand for task, mod difficulty keeping coins in palm during task. Pt reporting decreased sensation of coins in palm.              OT Education - 10/20/19 1710    Education Details  shoulder A/ROM in supine only    Person(s) Educated  Patient    Methods  Explanation;Demonstration;Handout    Comprehension  Verbalized understanding;Returned demonstration       OT Short Term Goals - 10/20/19 1710      OT SHORT TERM GOAL #1   Title  Pt will be provided with and educated on HEP to improve BUE use during ADL completion.    Time  3    Period  Weeks    Status  On-going    Target Date  11/09/19      OT SHORT TERM GOAL #2   Title  Pt will improve A/ROM of BUE to [facility] to increase ability to perform dressing  tasks with increased independence.    Time  3    Period  Weeks    Status  On-going        OT Long Term Goals - 10/20/19 1710      OT LONG TERM GOAL #1   Title  Pt will decrease fascial restrictions in bilateral trapezius and scapular regions to minimal amounts or less to improve ability to sleep at night.    Time  6    Period  Weeks    Status  On-going      OT LONG TERM GOAL #2   Title  Pt will increase BUE strength to 4/5 or greater to improve ability to perform lightweight lifting tasks independently.    Time  6    Period  Weeks    Status  On-going      OT LONG TERM GOAL #3   Title  Pt will increase grip strength in bilateral hands by at least 10#, pinch strength in left hand by 3# to improve ability to open bottles and jars.    Time  6    Period  Weeks    Status  On-going      OT LONG TERM GOAL #4   Title  Pt will improve fine motor coordination in bilateral hands by completing 9 hole peg test in under 26" to improve dexterity required for handwriting tasks.    Time  6    Period  Weeks    Status  On-going      OT LONG TERM GOAL #5   Title  Pt will improve activity tolerance in bilateral hands to increase ability to perform sustained writing and/or typing tasks required for return to work.    Time  6    Period  Weeks    Status  On-going            Plan - 10/20/19 1734    Clinical Impression Statement  A: Initiated myofascial release and manual techniques to bilateral trapezius and scapular regions this date, left trapezius with large muscle knot palpated. Also initiated supine A/ROM, pt with decreased control of LUE, mod effort required for stability. Initiated grip strengthening and coordination tasks this date. Pt with difficulty during coin manipulation due to decreased sensation. Verbal cuing for form and technique.    Body Structure / Function / Physical Skills  ADL;Muscle spasms;UE functional use;Fascial  restriction;Pain;Flexibility;ROM;Coordination;Sensation;IADL;Strength  Plan  P: Follow up on HEP, continue with A/ROM progressing to sitting as able. Complete stereognosis task for sensory reintegration and discrimination       Patient will benefit from skilled therapeutic intervention in order to improve the following deficits and impairments:   Body Structure / Function / Physical Skills: ADL, Muscle spasms, UE functional use, Fascial restriction, Pain, Flexibility, ROM, Coordination, Sensation, IADL, Strength       Visit Diagnosis: Cervicalgia  Other symptoms and signs involving the musculoskeletal system  Other lack of coordination    Problem List Patient Active Problem List   Diagnosis Date Noted  . Status post cervical spinal fusion 10/12/2019  . Elevated BP without diagnosis of hypertension 10/05/2019  . COVID-19 virus infection 10/04/2019  . Leukocytosis 10/04/2019  . Metastatic cancer to spine [facility]) 10/02/2019    Guadelupe Sabin, OTR/L  [phone] 10/20/2019, 5:41 PM  [facility] [address] [place], [place], [address] Phone: [phone]   Fax:  [phone]  Name: Scott Eaton MRN: [id number] Date of Birth: February 04, 1976

## 2019-10-20 NOTE — Patient Instructions (Signed)

## 2019-10-24 ENCOUNTER — Other Ambulatory Visit: Payer: Self-pay

## 2019-10-24 ENCOUNTER — Ambulatory Visit (HOSPITAL_COMMUNITY): Payer: BC Managed Care – PPO

## 2019-10-24 DIAGNOSIS — M542 Cervicalgia: Secondary | ICD-10-CM

## 2019-10-24 DIAGNOSIS — R29898 Other symptoms and signs involving the musculoskeletal system: Secondary | ICD-10-CM

## 2019-10-24 DIAGNOSIS — Z981 Arthrodesis status: Secondary | ICD-10-CM | POA: Diagnosis not present

## 2019-10-24 DIAGNOSIS — R278 Other lack of coordination: Secondary | ICD-10-CM

## 2019-10-24 NOTE — Therapy (Signed)
[place], [place], [address] Phone: [phone]   Fax:  [phone]  Occupational Therapy Treatment  Patient Details  Name: Scott Eaton MRN: [id number] Date of Birth: July 26, 1976 Referring Provider (OT): Dr. Erline Levine   Encounter Date: 10/24/2019  OT End of Session - 10/24/19 1453    Visit Number  3    Number of Visits  12    Date for OT Re-Evaluation  11/30/19   mini-reassessment 11/16/19   Authorization Type  BCBS PPO    Authorization Time Period  60 visit limit PT/OT, 0 used; $20 copay    Authorization - Visit Number  3    Authorization - Number of Visits  70    OT Start Time  [id number]    OT Stop Time  1427    OT Time Calculation (min)  40 min    Activity Tolerance  Patient tolerated treatment well    Behavior During Therapy  [facility] for tasks assessed/performed       Past Medical History:  Diagnosis Date  . Cervical radiculopathy    cervical stenosis  . Complication of anesthesia    They had a problem putting pt to sleep  . Obesity     Past Surgical History:  Procedure Laterality Date  . LAMINECTOMY N/A 10/12/2019   Procedure: Cervical Three-Five Posterior Cervical Fusion with Laminectomy and Resection of Tumor;  Surgeon: Erline Levine, MD;  Location: [facility];  Service: Neurosurgery;  Laterality: N/A;  Cervical 3-4 Posterior cervical fusion with laminectomy and resection of tumor  . TONSILLECTOMY    . WISDOM TOOTH EXTRACTION      There were no vitals filed for this visit.  Subjective Assessment - 10/24/19 1409    Subjective   S: I have been working really hard on my arm exercises at home.    Currently in Pain?  No         [facility] OT Assessment - 10/24/19 1416      Assessment   Medical Diagnosis  s/p cervical fusion C3-C5      Precautions   Precautions  Cervical    Precaution Comments  C3-C5 fusion. No lifting over 5lbs         Katina Dung - 10/24/19 0001    Open a tight or new jar   Moderate difficulty    Do heavy household chores (wash walls, wash floors)  Severe difficulty    Carry a shopping bag or briefcase  Moderate difficulty    Wash your back  Severe difficulty    Use a knife to cut food  Moderate difficulty    Recreational activities in which you take some force or impact through your arm, shoulder, or hand (golf, hammering, tennis)  Severe difficulty    During the past week, to what extent has your arm, shoulder or hand problem interfered with your normal social activities with family, friends, neighbors, or groups?  Slightly    During the past week, to what extent has your arm, shoulder or hand problem limited your work or other regular daily activities  Slightly    Arm, shoulder, or hand pain.  Mild    Tingling (pins and needles) in your arm, shoulder, or hand  Moderate    Difficulty Sleeping  No difficulty    DASH Score  45.45 %           OT Treatments/Exercises (OP) - 10/24/19 1417      Exercises   Exercises  Shoulder;Hand      Shoulder Exercises: Supine   Protraction  PROM;5 reps    Horizontal ABduction  PROM;5 reps    External Rotation  PROM;5 reps    Internal Rotation  PROM;5 reps    Flexion  PROM;5 reps    ABduction  PROM;5 reps      Shoulder Exercises: Standing   Protraction  AROM;10 reps    Horizontal ABduction  AROM;10 reps    External Rotation  AROM;10 reps    Internal Rotation  AROM;10 reps    Flexion  AROM;10 reps    ABduction  AROM;10 reps      Hand Exercises   Hand Gripper with Large Beads  right: all beads at 61# left: all beads at 55#   horizontal   Hand Gripper with Medium Beads  right: all beads at 61# left: all beads at 55#   horizontal   Hand Gripper with Small Beads  right: all beads at 61# left: all beads at 55#   horizontal     Sensation Exercises   Sensory Retraining  Sensory retraining completed. With eyes closed, patient was able to describe the texture of various objects placed in his right and left hand  individually.       Manual Therapy   Manual Therapy  Myofascial release    Manual therapy comments  completed separately from therapeutic exercises    Myofascial Release  myofascial release to bilateral trapezius and scapular regions to decrease pain and fascial restrictions and increase joint ROM             OT Education - 10/24/19 1451    Education Details  Pt was instructed to complete his shoulder A/ROM standing now. Provided green theraputty to progress hand strengthening HEP to.    Person(s) Educated  Patient    Methods  Explanation    Comprehension  Verbalized understanding       OT Short Term Goals - 10/24/19 1455      OT SHORT TERM GOAL #1   Title  Pt will be provided with and educated on HEP to improve BUE use during ADL completion.    Time  3    Period  Weeks    Status  Achieved    Target Date  11/09/19      OT SHORT TERM GOAL #2   Title  Pt will improve A/ROM of BUE to [facility] to increase ability to perform dressing tasks with increased independence.    Time  3    Period  Weeks    Status  Achieved        OT Long Term Goals - 10/20/19 1710      OT LONG TERM GOAL #1   Title  Pt will decrease fascial restrictions in bilateral trapezius and scapular regions to minimal amounts or less to improve ability to sleep at night.    Time  6    Period  Weeks    Status  On-going      OT LONG TERM GOAL #2   Title  Pt will increase BUE strength to 4/5 or greater to improve ability to perform lightweight lifting tasks independently.    Time  6    Period  Weeks    Status  On-going      OT LONG TERM GOAL #3   Title  Pt will increase grip strength in bilateral hands by at least 10#, pinch strength in left hand by 3# to improve ability to open bottles  and jars.    Time  6    Period  Weeks    Status  On-going      OT LONG TERM GOAL #4   Title  Pt will improve fine motor coordination in bilateral hands by completing 9 hole peg test in under 26" to improve dexterity  required for handwriting tasks.    Time  6    Period  Weeks    Status  On-going      OT LONG TERM GOAL #5   Title  Pt will improve activity tolerance in bilateral hands to increase ability to perform sustained writing and/or typing tasks required for return to work.    Time  6    Period  Weeks    Status  On-going            Plan - 10/24/19 1453    Clinical Impression Statement  A: Pt was able to complete standing A/ROM shoulder exercises without difficulty. Pt reports that his sensation is gradually becoming better in both hands. He presents with less fascial restrictions in bilateral shoulder region. Full P/ROM was demonstrated this date. Pt was able to complete handgripper task with resistance increased for both hands. VC for form and technique was provided. Manual techniques completed to address fascial restrctions.    Body Structure / Function / Physical Skills  ADL;Muscle spasms;UE functional use;Fascial restriction;Pain;Flexibility;ROM;Coordination;Sensation;IADL;Strength    Plan  P: D/C passive stretching. Progress to strengthening with lightweight supine and standing if able to tolerate.    Consulted and Agree with Plan of Care  Patient       Patient will benefit from skilled therapeutic intervention in order to improve the following deficits and impairments:   Body Structure / Function / Physical Skills: ADL, Muscle spasms, UE functional use, Fascial restriction, Pain, Flexibility, ROM, Coordination, Sensation, IADL, Strength       Visit Diagnosis: Cervicalgia  Other symptoms and signs involving the musculoskeletal system  Other lack of coordination    Problem List Patient Active Problem List   Diagnosis Date Noted  . Status post cervical spinal fusion 10/12/2019  . Elevated BP without diagnosis of hypertension 10/05/2019  . COVID-19 virus infection 10/04/2019  . Leukocytosis 10/04/2019  . Metastatic cancer to spine [facility]) 10/02/2019    Ailene Ravel,  OTR/L,CBIS  [phone]   10/24/2019, 2:57 PM  [place] [address] [place], [place], [address] Phone: [phone]   Fax:  [phone]  Name: Scott Eaton MRN: [id number] Date of Birth: [birth year]

## 2019-10-26 ENCOUNTER — Encounter (HOSPITAL_COMMUNITY): Payer: Self-pay

## 2019-10-26 ENCOUNTER — Other Ambulatory Visit: Payer: Self-pay

## 2019-10-26 ENCOUNTER — Ambulatory Visit (HOSPITAL_COMMUNITY): Payer: BC Managed Care – PPO

## 2019-10-26 DIAGNOSIS — M542 Cervicalgia: Secondary | ICD-10-CM

## 2019-10-26 DIAGNOSIS — R278 Other lack of coordination: Secondary | ICD-10-CM

## 2019-10-26 DIAGNOSIS — R29898 Other symptoms and signs involving the musculoskeletal system: Secondary | ICD-10-CM

## 2019-10-26 DIAGNOSIS — Z981 Arthrodesis status: Secondary | ICD-10-CM | POA: Diagnosis not present

## 2019-10-26 NOTE — Therapy (Signed)
[place], [place], [address] Phone: [phone]   Fax:  [phone]  Occupational Therapy Treatment  Patient Details  Name: Scott Eaton MRN: [id number] Date of Birth: 1976/09/17 Referring Provider (OT): Dr. Erline Levine   Encounter Date: 10/26/2019  OT End of Session - 10/26/19 1110    Visit Number  4    Number of Visits  12    Date for OT Re-Evaluation  11/30/19   mini-reassessment 11/16/19   Authorization Type  BCBS PPO    Authorization Time Period  60 visit limit PT/OT, 0 used; $20 copay    Authorization - Visit Number  4    Authorization - Number of Visits  60    OT Start Time  [id number]    OT Stop Time  1111    OT Time Calculation (min)  39 min    Activity Tolerance  Patient tolerated treatment well    Behavior During Therapy  [facility] for tasks assessed/performed       Past Medical History:  Diagnosis Date  . Cervical radiculopathy    cervical stenosis  . Complication of anesthesia    They had a problem putting pt to sleep  . Obesity     Past Surgical History:  Procedure Laterality Date  . LAMINECTOMY N/A 10/12/2019   Procedure: Cervical Three-Five Posterior Cervical Fusion with Laminectomy and Resection of Tumor;  Surgeon: Erline Levine, MD;  Location: [facility];  Service: Neurosurgery;  Laterality: N/A;  Cervical 3-4 Posterior cervical fusion with laminectomy and resection of tumor  . TONSILLECTOMY    . WISDOM TOOTH EXTRACTION      There were no vitals filed for this visit.  Subjective Assessment - 10/26/19 1050    Subjective   S: I get some tightness in the back of my neck if I'm looking down for a long time.    Currently in Pain?  No/denies         [facility] OT Assessment - 10/26/19 1050      Assessment   Medical Diagnosis  s/p cervical fusion C3-C5      Precautions   Precautions  Cervical    Precaution Comments  C3-C5 fusion. No lifting over 5lbs               OT  Treatments/Exercises (OP) - 10/26/19 1050      Exercises   Exercises  Shoulder;Hand;Theraputty      Shoulder Exercises: Supine   Protraction  Strengthening;12 reps    Protraction Weight (lbs)  3    Horizontal ABduction  Strengthening;10 reps    Horizontal ABduction Weight (lbs)  3    External Rotation  Strengthening;12 reps    External Rotation Weight (lbs)  3    Internal Rotation  Strengthening;12 reps    Internal Rotation Weight (lbs)  3    Flexion  Strengthening;12 reps    Shoulder Flexion Weight (lbs)  3    ABduction  Strengthening;12 reps    Shoulder ABduction Weight (lbs)  3      Shoulder Exercises: Standing   Protraction  Strengthening;10 reps    Protraction Weight (lbs)  3    External Rotation  Strengthening;10 reps    External Rotation Weight (lbs)  3    Internal Rotation  Strengthening;10 reps    Internal Rotation Weight (lbs)  3    Flexion  Strengthening;10 reps    Shoulder Flexion Weight (lbs)  3    ABduction  Strengthening;10 reps;Limitations  Shoulder ABduction Weight (lbs)  3    ABduction Limitations  to shoulder level due to form      Hand Exercises   Other Hand Exercises  Used pvc pipe to cut circles into putty while alternating rows with each hand.       Theraputty   Theraputty - Flatten  red- seated    Theraputty - Roll  red- both    Theraputty - Pinch  red- lateral and 3 point both      Manual Therapy   Manual Therapy  Myofascial release    Manual therapy comments  completed separately from therapeutic exercises    Myofascial Release  myofascial release to bilateral trapezius and scapular regions to decrease pain and fascial restrictions and increase joint ROM      Fine Motor Coordination (Hand/Wrist)   Fine Motor Coordination  Grooved pegs    Grooved pegs  Alternating between right and left hand, pt picked up 5 pegs and placed in pegboard working on in hand manipulation.              OT Education - 10/26/19 1109    Education Details   Encouraged patient to use 2 or 3lb handweights at home when completing his HEP. Continue to complete horizontal abduction without weight (A/ROM).    Person(s) Educated  Patient    Methods  Explanation    Comprehension  Verbalized understanding       OT Short Term Goals - 10/26/19 1120      OT SHORT TERM GOAL #1   Title  Pt will be provided with and educated on HEP to improve BUE use during ADL completion.    Time  3    Period  Weeks    Target Date  11/09/19      OT SHORT TERM GOAL #2   Title  Pt will improve A/ROM of BUE to [facility] to increase ability to perform dressing tasks with increased independence.    Time  3    Period  Weeks        OT Long Term Goals - 10/20/19 1710      OT LONG TERM GOAL #1   Title  Pt will decrease fascial restrictions in bilateral trapezius and scapular regions to minimal amounts or less to improve ability to sleep at night.    Time  6    Period  Weeks    Status  On-going      OT LONG TERM GOAL #2   Title  Pt will increase BUE strength to 4/5 or greater to improve ability to perform lightweight lifting tasks independently.    Time  6    Period  Weeks    Status  On-going      OT LONG TERM GOAL #3   Title  Pt will increase grip strength in bilateral hands by at least 10#, pinch strength in left hand by 3# to improve ability to open bottles and jars.    Time  6    Period  Weeks    Status  On-going      OT LONG TERM GOAL #4   Title  Pt will improve fine motor coordination in bilateral hands by completing 9 hole peg test in under 26" to improve dexterity required for handwriting tasks.    Time  6    Period  Weeks    Status  On-going      OT LONG TERM GOAL #5   Title  Pt will improve  activity tolerance in bilateral hands to increase ability to perform sustained writing and/or typing tasks required for return to work.    Time  6    Period  Weeks    Status  On-going            Plan - 10/26/19 1117    Clinical Impression Statement   A:Patient reports that he feels his strength is slowly coming back. He is completing his HEP at home successfully. Progressed to strengthening supine and standing. LUE presents with more difficulty and less strength than RUE. HEP was updated and pt verbally given instructions for modifications. Manual techniques completed to address fascial retrictions in the upper trapezius. Pt reports tightness when looking down for a period of time. No fascial restrictions or trigger points palpated during session. VC for form and technique provided.    Body Structure / Function / Physical Skills  ADL;Muscle spasms;UE functional use;Fascial restriction;Pain;Flexibility;ROM;Coordination;Sensation;IADL;Strength    Plan  P: D/C passive stretching. Manual techniques only if needed. Follow up on HEP with weights. Add X to V arms with 1# standing. Therapy ball strengthening.    Consulted and Agree with Plan of Care  Patient       Patient will benefit from skilled therapeutic intervention in order to improve the following deficits and impairments:   Body Structure / Function / Physical Skills: ADL, Muscle spasms, UE functional use, Fascial restriction, Pain, Flexibility, ROM, Coordination, Sensation, IADL, Strength       Visit Diagnosis: Other lack of coordination  Other symptoms and signs involving the musculoskeletal system  Cervicalgia    Problem List Patient Active Problem List   Diagnosis Date Noted  . Status post cervical spinal fusion 10/12/2019  . Elevated BP without diagnosis of hypertension 10/05/2019  . COVID-19 virus infection 10/04/2019  . Leukocytosis 10/04/2019  . Metastatic cancer to spine [facility]) 10/02/2019   Ailene Ravel, OTR/L,CBIS  [phone]  10/26/2019, 11:26 AM  [place] [address] [place], [place], [address] Phone: [phone]   Fax:  [phone]  Name: Scott Eaton MRN: [id number] Date of Birth: 08-Jun-1976

## 2019-10-30 ENCOUNTER — Encounter (HOSPITAL_COMMUNITY): Payer: BC Managed Care – PPO

## 2019-10-31 ENCOUNTER — Ambulatory Visit (HOSPITAL_COMMUNITY): Payer: BC Managed Care – PPO | Admitting: Occupational Therapy

## 2019-10-31 ENCOUNTER — Encounter (HOSPITAL_COMMUNITY): Payer: Self-pay | Admitting: Occupational Therapy

## 2019-10-31 ENCOUNTER — Other Ambulatory Visit: Payer: Self-pay

## 2019-10-31 DIAGNOSIS — M542 Cervicalgia: Secondary | ICD-10-CM

## 2019-10-31 DIAGNOSIS — R29898 Other symptoms and signs involving the musculoskeletal system: Secondary | ICD-10-CM

## 2019-10-31 DIAGNOSIS — Z981 Arthrodesis status: Secondary | ICD-10-CM | POA: Diagnosis not present

## 2019-10-31 DIAGNOSIS — R278 Other lack of coordination: Secondary | ICD-10-CM

## 2019-10-31 NOTE — Therapy (Signed)
[place], [place], [address] Phone: [phone]   Fax:  [phone]  Occupational Therapy Treatment  Patient Details  Name: Scott Eaton MRN: [id number] Date of Birth: 1975/12/28 Referring Provider (OT): Dr. Erline Levine   Encounter Date: 10/31/2019  OT End of Session - 10/31/19 1638    Visit Number  5    Number of Visits  12    Date for OT Re-Evaluation  11/30/19   mini-reassessment 11/16/19   Authorization Type  BCBS PPO    Authorization Time Period  60 visit limit PT/OT, 0 used; $20 copay    Authorization - Visit Number  5    Authorization - Number of Visits  69    OT Start Time  1601    OT Stop Time  1639    OT Time Calculation (min)  38 min    Activity Tolerance  Patient tolerated treatment well    Behavior During Therapy  [facility] for tasks assessed/performed       Past Medical History:  Diagnosis Date  . Cervical radiculopathy    cervical stenosis  . Complication of anesthesia    They had a problem putting pt to sleep  . Obesity     Past Surgical History:  Procedure Laterality Date  . LAMINECTOMY N/A 10/12/2019   Procedure: Cervical Three-Five Posterior Cervical Fusion with Laminectomy and Resection of Tumor;  Surgeon: Erline Levine, MD;  Location: [place];  Service: Neurosurgery;  Laterality: N/A;  Cervical 3-4 Posterior cervical fusion with laminectomy and resection of tumor  . TONSILLECTOMY    . WISDOM TOOTH EXTRACTION      There were no vitals filed for this visit.  Subjective Assessment - 10/31/19 1559    Subjective   S: I've been doing some work around the house today.    Currently in Pain?  No/denies         [facility] OT Assessment - 10/31/19 1559      Assessment   Medical Diagnosis  s/p cervical fusion C3-C5      Precautions   Precautions  Cervical    Precaution Comments  C3-C5 fusion. No lifting over 5lbs               OT Treatments/Exercises (OP) - 10/31/19 1603       Exercises   Exercises  Shoulder;Hand;Theraputty      Shoulder Exercises: Supine   Protraction  Strengthening;12 reps    Protraction Weight (lbs)  3    Horizontal ABduction  Strengthening;10 reps    Horizontal ABduction Weight (lbs)  3    External Rotation  Strengthening;12 reps    External Rotation Weight (lbs)  3    Internal Rotation  Strengthening;12 reps    Internal Rotation Weight (lbs)  3    Flexion  Strengthening;12 reps    Shoulder Flexion Weight (lbs)  3    ABduction  Strengthening;12 reps    Shoulder ABduction Weight (lbs)  3      Shoulder Exercises: Standing   Protraction  Strengthening;10 reps    Protraction Weight (lbs)  3    Horizontal ABduction  Strengthening;10 reps    Horizontal ABduction Weight (lbs)  3    External Rotation  Strengthening;10 reps    External Rotation Weight (lbs)  3    Internal Rotation  Strengthening;10 reps    Internal Rotation Weight (lbs)  3    Flexion  Strengthening;10 reps    Shoulder Flexion Weight (lbs)  3    ABduction  Strengthening;10 reps;Limitations    Shoulder ABduction Weight (lbs)  3    ABduction Limitations  to shoulder level due to form    Extension  Theraband;12 reps    Theraband Level (Shoulder Extension)  Level 3 (Green)    Row  [employer] reps    Theraband Level (Shoulder Row)  Level 3 (Green)    Retraction  Theraband;12 reps    Theraband Level (Shoulder Retraction)  Level 3 (Green)      Shoulder Exercises: Therapy Ball   Other Therapy Ball Exercises  green therapy ball: chest press, flexion, circles both directions 12X each      Shoulder Exercises: ROM/Strengthening   X to V Arms  12X, 1#    Ball on Wall  1' flexion 1' abduction, each arm      Hand Exercises   Hand Gripper with Large Beads  right: all beads at 61# left: all beads at 55#   horizontal    Hand Gripper with Medium Beads  right: all beads at 61# left: all beads at 55#   horizontal   Hand Gripper with Small Beads  right: all beads at 61# left:  all beads at 55#   horizontal              OT Short Term Goals - 10/26/19 1120      OT SHORT TERM GOAL #1   Title  Pt will be provided with and educated on HEP to improve BUE use during ADL completion.    Time  3    Period  Weeks    Target Date  11/09/19      OT SHORT TERM GOAL #2   Title  Pt will improve A/ROM of BUE to [facility] to increase ability to perform dressing tasks with increased independence.    Time  3    Period  Weeks        OT Long Term Goals - 10/20/19 1710      OT LONG TERM GOAL #1   Title  Pt will decrease fascial restrictions in bilateral trapezius and scapular regions to minimal amounts or less to improve ability to sleep at night.    Time  6    Period  Weeks    Status  On-going      OT LONG TERM GOAL #2   Title  Pt will increase BUE strength to 4/5 or greater to improve ability to perform lightweight lifting tasks independently.    Time  6    Period  Weeks    Status  On-going      OT LONG TERM GOAL #3   Title  Pt will increase grip strength in bilateral hands by at least 10#, pinch strength in left hand by 3# to improve ability to open bottles and jars.    Time  6    Period  Weeks    Status  On-going      OT LONG TERM GOAL #4   Title  Pt will improve fine motor coordination in bilateral hands by completing 9 hole peg test in under 26" to improve dexterity required for handwriting tasks.    Time  6    Period  Weeks    Status  On-going      OT LONG TERM GOAL #5   Title  Pt will improve activity tolerance in bilateral hands to increase ability to perform sustained writing and/or typing tasks required for return to work.  Time  6    Period  Weeks    Status  On-going            Plan - 10/31/19 1635    Clinical Impression Statement  A: Pt reports he has been working around the house today and feels pretty good. Continued with BUE strengthening using 3# weights, added x to v arms with 1# weight and therapy ball strengthening. LUE fatigues  quicker than RUE, RUE with better form. Added scapular theraband exercises as well. Continued with grip strengthening. Verbal cuing for form and technique during session.    Body Structure / Function / Physical Skills  ADL;Muscle spasms;UE functional use;Fascial restriction;Pain;Flexibility;ROM;Coordination;Sensation;IADL;Strength    Plan  P: manual techniques prn, continue with strengthening and add 1# wrist weights to therapy ball exercises. Add overhead lacing task       Patient will benefit from skilled therapeutic intervention in order to improve the following deficits and impairments:   Body Structure / Function / Physical Skills: ADL, Muscle spasms, UE functional use, Fascial restriction, Pain, Flexibility, ROM, Coordination, Sensation, IADL, Strength       Visit Diagnosis: Other symptoms and signs involving the musculoskeletal system  Cervicalgia  Other lack of coordination    Problem List Patient Active Problem List   Diagnosis Date Noted  . Status post cervical spinal fusion 10/12/2019  . Elevated BP without diagnosis of hypertension 10/05/2019  . COVID-19 virus infection 10/04/2019  . Leukocytosis 10/04/2019  . Metastatic cancer to spine [facility]) 10/02/2019   Guadelupe Sabin, OTR/L  [phone] 10/31/2019, 4:39 PM  [place] [address] [place], [place], [address] Phone: [phone]   Fax:  [phone]  Name: Scott Eaton MRN: [id number] Date of Birth: October 22, 1976

## 2019-11-01 ENCOUNTER — Encounter (HOSPITAL_COMMUNITY): Payer: Self-pay | Admitting: Occupational Therapy

## 2019-11-01 ENCOUNTER — Ambulatory Visit (HOSPITAL_COMMUNITY): Payer: BC Managed Care – PPO | Admitting: Occupational Therapy

## 2019-11-01 DIAGNOSIS — R278 Other lack of coordination: Secondary | ICD-10-CM

## 2019-11-01 DIAGNOSIS — R29898 Other symptoms and signs involving the musculoskeletal system: Secondary | ICD-10-CM

## 2019-11-01 DIAGNOSIS — Z981 Arthrodesis status: Secondary | ICD-10-CM | POA: Diagnosis not present

## 2019-11-01 DIAGNOSIS — M542 Cervicalgia: Secondary | ICD-10-CM

## 2019-11-01 NOTE — Therapy (Signed)
[place], [place], [address] Phone: [phone]   Fax:  [phone]  Occupational Therapy Treatment  Patient Details  Name: Scott Eaton MRN: [id number] Date of Birth: 05/09/1976 Referring Provider (OT): Dr. Erline Levine   Encounter Date: 11/01/2019  OT End of Session - 11/01/19 1157    Visit Number  6    Number of Visits  12    Date for OT Re-Evaluation  11/30/19   mini-reassessment 11/16/19   Authorization Type  BCBS PPO    Authorization Time Period  60 visit limit PT/OT, 0 used; $20 copay    Authorization - Visit Number  6    Authorization - Number of Visits  60    OT Start Time  1117    OT Stop Time  1200    OT Time Calculation (min)  43 min    Activity Tolerance  Patient tolerated treatment well    Behavior During Therapy  [facility] for tasks assessed/performed       Past Medical History:  Diagnosis Date  . Cervical radiculopathy    cervical stenosis  . Complication of anesthesia    They had a problem putting pt to sleep  . Obesity     Past Surgical History:  Procedure Laterality Date  . LAMINECTOMY N/A 10/12/2019   Procedure: Cervical Three-Five Posterior Cervical Fusion with Laminectomy and Resection of Tumor;  Surgeon: Erline Levine, MD;  Location: [facility];  Service: Neurosurgery;  Laterality: N/A;  Cervical 3-4 Posterior cervical fusion with laminectomy and resection of tumor  . TONSILLECTOMY    . WISDOM TOOTH EXTRACTION      There were no vitals filed for this visit.  Subjective Assessment - 11/01/19 1116    Subjective   S: I've been working outside some this morning.    Currently in Pain?  No/denies         [facility] OT Assessment - 11/01/19 1116      Assessment   Medical Diagnosis  s/p cervical fusion C3-C5      Precautions   Precautions  Cervical    Precaution Comments  C3-C5 fusion. No lifting over 5lbs               OT Treatments/Exercises (OP) - 11/01/19 1118      Exercises   Exercises  Shoulder;Hand;Theraputty      Shoulder Exercises: Supine   Protraction  Strengthening;12 reps    Protraction Weight (lbs)  3    Horizontal ABduction  Strengthening;12 reps    Horizontal ABduction Weight (lbs)  3    External Rotation  Strengthening;12 reps    External Rotation Weight (lbs)  3    Internal Rotation  Strengthening;12 reps    Internal Rotation Weight (lbs)  3    Flexion  Strengthening;12 reps    Shoulder Flexion Weight (lbs)  3    ABduction  Strengthening;12 reps    Shoulder ABduction Weight (lbs)  3    Other Supine Exercises  serratus anterior punch using orange weighted ball, 10X      Shoulder Exercises: Standing   Protraction  Strengthening;10 reps    Protraction Weight (lbs)  3    Horizontal ABduction  Strengthening;10 reps    Horizontal ABduction Weight (lbs)  2    External Rotation  Strengthening;10 reps    External Rotation Weight (lbs)  3    Internal Rotation  Strengthening;10 reps    Internal Rotation Weight (lbs)  3  Flexion  Strengthening;10 reps    Shoulder Flexion Weight (lbs)  3    ABduction  Strengthening;10 reps;Limitations    Shoulder ABduction Weight (lbs)  2    Extension  Theraband;12 reps    Theraband Level (Shoulder Extension)  Level 3 (Green)    Row  [employer] reps    Theraband Level (Shoulder Row)  Level 3 (Green)    Retraction  Theraband;12 reps    Theraband Level (Shoulder Retraction)  Level 3 (Green)      Shoulder Exercises: Therapy Ball   Other Therapy Ball Exercises  green therapy ball: chest press, flexion, circles both directions, vertical and horizontal pulses, 12X each; 1# wrist weight      Shoulder Exercises: ROM/Strengthening   UBE (Upper Arm Bike)  Level 4 3' forward 3' reverse    X to V Arms  12X, 1#    Proximal Shoulder Strengthening, Supine  10X 3#, no rest breaks    Ball on Wall  1' flexion 1' abduction, each arm               OT Short Term Goals - 10/26/19 1120      OT SHORT TERM  GOAL #1   Title  Pt will be provided with and educated on HEP to improve BUE use during ADL completion.    Time  3    Period  Weeks    Target Date  11/09/19      OT SHORT TERM GOAL #2   Title  Pt will improve A/ROM of BUE to [facility] to increase ability to perform dressing tasks with increased independence.    Time  3    Period  Weeks        OT Long Term Goals - 10/20/19 1710      OT LONG TERM GOAL #1   Title  Pt will decrease fascial restrictions in bilateral trapezius and scapular regions to minimal amounts or less to improve ability to sleep at night.    Time  6    Period  Weeks    Status  On-going      OT LONG TERM GOAL #2   Title  Pt will increase BUE strength to 4/5 or greater to improve ability to perform lightweight lifting tasks independently.    Time  6    Period  Weeks    Status  On-going      OT LONG TERM GOAL #3   Title  Pt will increase grip strength in bilateral hands by at least 10#, pinch strength in left hand by 3# to improve ability to open bottles and jars.    Time  6    Period  Weeks    Status  On-going      OT LONG TERM GOAL #4   Title  Pt will improve fine motor coordination in bilateral hands by completing 9 hole peg test in under 26" to improve dexterity required for handwriting tasks.    Time  6    Period  Weeks    Status  On-going      OT LONG TERM GOAL #5   Title  Pt will improve activity tolerance in bilateral hands to increase ability to perform sustained writing and/or typing tasks required for return to work.    Time  6    Period  Weeks    Status  On-going            Plan - 11/01/19 1137    Clinical Impression Statement  A: Pt reports working outside this morning, no pain. Continue with BUE strengthening using 1# to 3# weights. Added wrist weights to therapy ball exercises. Focusing on form and LUE control throughout session today, occasional rest breaks provided as needed for LUE fatigue. Continued with scapular theraband and added UBE  this session. Verbal cuing for form and technique.    Body Structure / Function / Physical Skills  ADL;Muscle spasms;UE functional use;Fascial restriction;Pain;Flexibility;ROM;Coordination;Sensation;IADL;Strength    Plan  P: manual techniques prn, continue with BUE strengthening, add overhead lacing, attempt body blade       Patient will benefit from skilled therapeutic intervention in order to improve the following deficits and impairments:   Body Structure / Function / Physical Skills: ADL, Muscle spasms, UE functional use, Fascial restriction, Pain, Flexibility, ROM, Coordination, Sensation, IADL, Strength       Visit Diagnosis: Other symptoms and signs involving the musculoskeletal system  Cervicalgia  Other lack of coordination    Problem List Patient Active Problem List   Diagnosis Date Noted  . Status post cervical spinal fusion 10/12/2019  . Elevated BP without diagnosis of hypertension 10/05/2019  . COVID-19 virus infection 10/04/2019  . Leukocytosis 10/04/2019  . Metastatic cancer to spine [facility]) 10/02/2019   Guadelupe Sabin, OTR/L  [phone] 11/01/2019, 12:02 PM  [place] [address] [place], [place], [address] Phone: [phone]   Fax:  [phone]  Name: Scott Eaton MRN: [id number] Date of Birth: May 19, 1976

## 2019-11-06 ENCOUNTER — Ambulatory Visit (HOSPITAL_COMMUNITY): Payer: BC Managed Care – PPO

## 2019-11-06 ENCOUNTER — Other Ambulatory Visit: Payer: Self-pay

## 2019-11-06 ENCOUNTER — Encounter (HOSPITAL_COMMUNITY): Payer: Self-pay

## 2019-11-06 DIAGNOSIS — Z981 Arthrodesis status: Secondary | ICD-10-CM | POA: Diagnosis not present

## 2019-11-06 DIAGNOSIS — R278 Other lack of coordination: Secondary | ICD-10-CM

## 2019-11-06 DIAGNOSIS — M542 Cervicalgia: Secondary | ICD-10-CM

## 2019-11-06 DIAGNOSIS — R29898 Other symptoms and signs involving the musculoskeletal system: Secondary | ICD-10-CM

## 2019-11-06 NOTE — Therapy (Signed)
[place], [place], [address] Phone: [phone]   Fax:  [phone]  Occupational Therapy Treatment  Patient Details  Name: Scott Eaton MRN: [id number] Date of Birth: 12-Jan-1976 Referring Provider (OT): Dr. Erline Levine   Encounter Date: 11/06/2019  OT End of Session - 11/06/19 1436    Visit Number  7    Number of Visits  12    Date for OT Re-Evaluation  11/30/19   mini-reassessment 11/16/19   Authorization Type  BCBS PPO    Authorization Time Period  60 visit limit PT/OT, 0 used; $20 copay    Authorization - Visit Number  7    Authorization - Number of Visits  28    OT Start Time  [id number]    OT Stop Time  1425    OT Time Calculation (min)  38 min    Activity Tolerance  Patient tolerated treatment well    Behavior During Therapy  [facility] for tasks assessed/performed       Past Medical History:  Diagnosis Date  . Cervical radiculopathy    cervical stenosis  . Complication of anesthesia    They had a problem putting pt to sleep  . Obesity     Past Surgical History:  Procedure Laterality Date  . LAMINECTOMY N/A 10/12/2019   Procedure: Cervical Three-Five Posterior Cervical Fusion with Laminectomy and Resection of Tumor;  Surgeon: Erline Levine, MD;  Location: [place];  Service: Neurosurgery;  Laterality: N/A;  Cervical 3-4 Posterior cervical fusion with laminectomy and resection of tumor  . TONSILLECTOMY    . WISDOM TOOTH EXTRACTION      There were no vitals filed for this visit.  Subjective Assessment - 11/06/19 1359    Subjective   S: I had my first dose of radiation today.    Currently in Pain?  Yes    Pain Score  4     Pain Location  Neck    Pain Orientation  Posterior    Pain Descriptors / Indicators  Sore    Pain Type  Acute pain    Pain Radiating Towards  N/A    Pain Onset  Today    Pain Frequency  Constant    Aggravating Factors   first dose of radiation today.    Pain Relieving Factors   None at this time. Have not tried anything.    Effect of Pain on Daily Activities  No effect    Multiple Pain Sites  No         [facility] OT Assessment - 11/06/19 1417      Assessment   Medical Diagnosis  s/p cervical fusion C3-C5      Precautions   Precautions  Cervical;Other (comment)    Precaution Comments  C3-C5 fusion. No lifting over 5lbs. Radiation treatments for mass in neck.                OT Treatments/Exercises (OP) - 11/06/19 1403      Exercises   Exercises  Shoulder      Shoulder Exercises: Supine   Protraction  Strengthening;15 reps    Protraction Weight (lbs)  3    Horizontal ABduction  Strengthening;15 reps    Horizontal ABduction Weight (lbs)  3    External Rotation  Strengthening;15 reps    External Rotation Weight (lbs)  3    Internal Rotation  Strengthening;15 reps    Internal Rotation Weight (lbs)  3    Flexion  Strengthening;15 reps    Shoulder Flexion Weight (lbs)  3    ABduction  Strengthening;15 reps    Shoulder ABduction Weight (lbs)  3      Shoulder Exercises: Standing   Protraction  Strengthening;12 reps    Protraction Weight (lbs)  3    Horizontal ABduction  Strengthening;10 reps    Horizontal ABduction Weight (lbs)  3    External Rotation  Strengthening;12 reps    External Rotation Weight (lbs)  3    Internal Rotation  Strengthening;12 reps    Internal Rotation Weight (lbs)  3    Flexion  Strengthening;12 reps    ABduction  Strengthening;10 reps    Shoulder ABduction Weight (lbs)  3      Shoulder Exercises: Therapy Ball   Other Therapy Ball Exercises  green therapy ball: chest press, flexion, circles both directions, vertical and horizontal pulses, 12X each; 1# wrist weight      Shoulder Exercises: ROM/Strengthening   UBE (Upper Arm Bike)  Level 4 3' forward 3' reverse   pace: 7.0-10.0   Over Head Lace  seated 2' with 1# wrist weight. Arm extended at shoulder level.     X to V Arms  10X with 2#    Proximal Shoulder  Strengthening, Seated  10X with 3# with no rest breaks    Other ROM/Strengthening Exercises  Proximal shoulder strengthening while writing alphabet in the air; completed with each arm; 1# wrist weight               OT Short Term Goals - 10/26/19 1120      OT SHORT TERM GOAL #1   Title  Pt will be provided with and educated on HEP to improve BUE use during ADL completion.    Time  3    Period  Weeks    Target Date  11/09/19      OT SHORT TERM GOAL #2   Title  Pt will improve A/ROM of BUE to [facility] to increase ability to perform dressing tasks with increased independence.    Time  3    Period  Weeks        OT Long Term Goals - 10/20/19 1710      OT LONG TERM GOAL #1   Title  Pt will decrease fascial restrictions in bilateral trapezius and scapular regions to minimal amounts or less to improve ability to sleep at night.    Time  6    Period  Weeks    Status  On-going      OT LONG TERM GOAL #2   Title  Pt will increase BUE strength to 4/5 or greater to improve ability to perform lightweight lifting tasks independently.    Time  6    Period  Weeks    Status  On-going      OT LONG TERM GOAL #3   Title  Pt will increase grip strength in bilateral hands by at least 10#, pinch strength in left hand by 3# to improve ability to open bottles and jars.    Time  6    Period  Weeks    Status  On-going      OT LONG TERM GOAL #4   Title  Pt will improve fine motor coordination in bilateral hands by completing 9 hole peg test in under 26" to improve dexterity required for handwriting tasks.    Time  6    Period  Weeks    Status  On-going  OT LONG TERM GOAL #5   Title  Pt will improve activity tolerance in bilateral hands to increase ability to perform sustained writing and/or typing tasks required for return to work.    Time  6    Period  Weeks    Status  On-going            Plan - 11/06/19 1445    Clinical Impression Statement  A: Continued to progress  strengthening and shoulder stability this session. Held manual techniques to cervical region due to recent radiation treatment. Patient required occassional rest breaks as needed due to muscle fatigue. VC for form and technique were provided as needed.    Body Structure / Function / Physical Skills  ADL;Muscle spasms;UE functional use;Fascial restriction;Pain;Flexibility;ROM;Coordination;Sensation;IADL;Strength    Plan  P: Complete hand strengthening and coordination tasks.    Consulted and Agree with Plan of Care  Patient       Patient will benefit from skilled therapeutic intervention in order to improve the following deficits and impairments:   Body Structure / Function / Physical Skills: ADL, Muscle spasms, UE functional use, Fascial restriction, Pain, Flexibility, ROM, Coordination, Sensation, IADL, Strength       Visit Diagnosis: Other lack of coordination  Cervicalgia  Other symptoms and signs involving the musculoskeletal system    Problem List Patient Active Problem List   Diagnosis Date Noted  . Status post cervical spinal fusion 10/12/2019  . Elevated BP without diagnosis of hypertension 10/05/2019  . COVID-19 virus infection 10/04/2019  . Leukocytosis 10/04/2019  . Metastatic cancer to spine [facility]) 10/02/2019   Ailene Ravel, OTR/L,CBIS  [phone]  11/06/2019, 2:47 PM  [place] [address] [place], [place], [address] Phone: [phone]   Fax:  [phone]  Name: Scott Eaton MRN: [id number] Date of Birth: 1976-11-28

## 2019-11-07 NOTE — Progress Notes (Signed)
  Radiation Oncology         ([phone]) [phone] ________________________________  Name: Scott Eaton MRN: [id number]  Date: 10/06/2019  DOB: 1976/08/09  SIMULATION AND TREATMENT PLANNING NOTE  DIAGNOSIS:     ICD-10-CM   1. Metastatic cancer to spine [facility])  C79.51      Site:  Cervical spine: C3  NARRATIVE:  The patient was brought to the [place] suite.  Identity was confirmed.  All relevant records and images related to the planned course of therapy were reviewed.   Written consent to proceed with treatment was confirmed which was freely given after reviewing the details related to the planned course of therapy had been reviewed with the patient.  Then, the patient was set-up in a stable reproducible  supine position for radiation therapy.  CT images were obtained.  Surface markings were placed.    Medically necessary complex treatment device(s) for immobilization:  Thermoplastic mask.   The CT images were loaded into the planning software.  Then the target and avoidance structures were contoured.  Treatment planning then occurred.  The radiation prescription was entered and confirmed.  A total of 4 complex treatment devices were fabricated which relate to the designed radiation treatment fields. Each of these customized fields/ complex treatment devices will be used on a daily basis during the radiation course. I have requested : Isodose Plan.   PLAN:  The patient will receive 20 Gy in 5 fractions.  ________________________________   Jodelle Gross, MD, PhD

## 2019-11-07 NOTE — Addendum Note (Signed)
Encounter addended by: Kyung Rudd, MD on: 11/07/2019 1:55 PM  Actions taken: Visit diagnoses modified, Clinical Note Signed

## 2019-11-08 ENCOUNTER — Encounter (HOSPITAL_COMMUNITY): Payer: Self-pay | Admitting: Occupational Therapy

## 2019-11-08 ENCOUNTER — Other Ambulatory Visit: Payer: Self-pay

## 2019-11-08 ENCOUNTER — Ambulatory Visit (HOSPITAL_COMMUNITY): Payer: BC Managed Care – PPO | Attending: Neurosurgery | Admitting: Occupational Therapy

## 2019-11-08 DIAGNOSIS — M542 Cervicalgia: Secondary | ICD-10-CM | POA: Diagnosis present

## 2019-11-08 DIAGNOSIS — R29898 Other symptoms and signs involving the musculoskeletal system: Secondary | ICD-10-CM | POA: Diagnosis present

## 2019-11-08 DIAGNOSIS — R278 Other lack of coordination: Secondary | ICD-10-CM | POA: Diagnosis present

## 2019-11-08 NOTE — Therapy (Signed)
[place], [place], [address] Phone: [phone]   Fax:  [phone]  Occupational Therapy Treatment  Patient Details  Name: Scott Eaton MRN: [id number] Date of Birth: 04-28-1976 Referring Provider (OT): Dr. Erline Levine   Encounter Date: 11/08/2019  OT End of Session - 11/08/19 1158    Visit Number  8    Number of Visits  12    Date for OT Re-Evaluation  11/30/19   mini-reassessment 11/16/19   Authorization Type  BCBS PPO    Authorization Time Period  60 visit limit PT/OT, 0 used; $20 copay    Authorization - Visit Number  8    Authorization - Number of Visits  7    OT Start Time  1115    OT Stop Time  1154    OT Time Calculation (min)  39 min    Activity Tolerance  Patient tolerated treatment well    Behavior During Therapy  [facility] for tasks assessed/performed       Past Medical History:  Diagnosis Date  . Cervical radiculopathy    cervical stenosis  . Complication of anesthesia    They had a problem putting pt to sleep  . Obesity     Past Surgical History:  Procedure Laterality Date  . LAMINECTOMY N/A 10/12/2019   Procedure: Cervical Three-Five Posterior Cervical Fusion with Laminectomy and Resection of Tumor;  Surgeon: Erline Levine, MD;  Location: [place];  Service: Neurosurgery;  Laterality: N/A;  Cervical 3-4 Posterior cervical fusion with laminectomy and resection of tumor  . TONSILLECTOMY    . WISDOM TOOTH EXTRACTION      There were no vitals filed for this visit.  Subjective Assessment - 11/08/19 1115    Subjective   S: Everything is getting easier to do, I can open a straw now.    Currently in Pain?  Yes    Pain Score  4     Pain Location  Neck    Pain Orientation  Posterior    Pain Descriptors / Indicators  Sore    Pain Type  Acute pain    Pain Radiating Towards  N/A    Pain Onset  In the past 7 days    Pain Frequency  Constant    Aggravating Factors   radiation    Pain Relieving  Factors  none at this time    Effect of Pain on Daily Activities  no effect    Multiple Pain Sites  No         [facility] OT Assessment - 11/08/19 1115      Assessment   Medical Diagnosis  s/p cervical fusion C3-C5      Precautions   Precautions  Cervical;Other (comment)    Precaution Comments  C3-C5 fusion. No lifting over 5lbs. Radiation treatments for mass in neck.                OT Treatments/Exercises (OP) - 11/08/19 1116      Exercises   Exercises  Hand;Theraputty      Shoulder Exercises: Standing   Extension  Theraband;15 reps    Theraband Level (Shoulder Extension)  Level 3 (Green)    Row  Theraband;15 reps    Theraband Level (Shoulder Row)  Level 3 (Green)    Retraction  Theraband;15 reps    Theraband Level (Shoulder Retraction)  Level 3 (Green)      Shoulder Exercises: Therapy Diona Foley   Other Therapy Ball Exercises  green  therapy ball: chest press, flexion, circles both directions, 12X each; 2# wrist weight      Shoulder Exercises: ROM/Strengthening   Ball on Wall  1' flexion 1' abduction, each arm    Other ROM/Strengthening Exercises  Proximal shoulder strengthening while writing alphabet in the air; completed with each arm; 2# wrist weight      Hand Exercises   Hand Gripper with Large Beads  right: all beads at 75# left: all beads at 69#   horizontal    Hand Gripper with Medium Beads  right: all beads at 75# left: all beads at 69#   horizontal   Hand Gripper with Small Beads  right: all beads at 75# left: all beads at 69#   horizontal   Other Hand Exercises  Pt used black clothespin and 3 point pinch to grasp sponges and stack into 4 stacks of 5. Pt completed with each hand, min difficulty.     Other Hand Exercises  Used pvc pipe to cut circles into green putty while alternating rows with each hand.       Theraputty   Theraputty - Flatten  green-standing      Fine Motor Coordination (Hand/Wrist)   Fine Motor Coordination  Grooved pegs    Grooved pegs   Pt using tweezers to place pegs into pegboard, alternating hands for each row.                OT Short Term Goals - 10/26/19 1120      OT SHORT TERM GOAL #1   Title  Pt will be provided with and educated on HEP to improve BUE use during ADL completion.    Time  3    Period  Weeks    Target Date  11/09/19      OT SHORT TERM GOAL #2   Title  Pt will improve A/ROM of BUE to [facility] to increase ability to perform dressing tasks with increased independence.    Time  3    Period  Weeks        OT Long Term Goals - 10/20/19 1710      OT LONG TERM GOAL #1   Title  Pt will decrease fascial restrictions in bilateral trapezius and scapular regions to minimal amounts or less to improve ability to sleep at night.    Time  6    Period  Weeks    Status  On-going      OT LONG TERM GOAL #2   Title  Pt will increase BUE strength to 4/5 or greater to improve ability to perform lightweight lifting tasks independently.    Time  6    Period  Weeks    Status  On-going      OT LONG TERM GOAL #3   Title  Pt will increase grip strength in bilateral hands by at least 10#, pinch strength in left hand by 3# to improve ability to open bottles and jars.    Time  6    Period  Weeks    Status  On-going      OT LONG TERM GOAL #4   Title  Pt will improve fine motor coordination in bilateral hands by completing 9 hole peg test in under 26" to improve dexterity required for handwriting tasks.    Time  6    Period  Weeks    Status  On-going      OT LONG TERM GOAL #5   Title  Pt will improve activity  tolerance in bilateral hands to increase ability to perform sustained writing and/or typing tasks required for return to work.    Time  6    Period  Weeks    Status  On-going            Plan - 11/08/19 1131    Clinical Impression Statement  A: Pt reports continued improvement with ADL task completion. Session focusing on BUE grip strength and coordination tasks. Increased gripper resistance for  each hand on all size beads. Added tweezers to grooved pegboard activity, pt with min difficulty and required slightly increased time for task. Verbal cuing to limit compensation by leaning or raising elbows up. Pt demonstrates mjuch improvement in grip and pinch strength as well as coordination tasks. Also completed shoulder strengthening this session, mod fatigue in LUE during exercises. Verbal cuing for form and technique.    Body Structure / Function / Physical Skills  ADL;Muscle spasms;UE functional use;Fascial restriction;Pain;Flexibility;ROM;Coordination;Sensation;IADL;Strength    Plan  P: Focus on UE strengthening       Patient will benefit from skilled therapeutic intervention in order to improve the following deficits and impairments:   Body Structure / Function / Physical Skills: ADL, Muscle spasms, UE functional use, Fascial restriction, Pain, Flexibility, ROM, Coordination, Sensation, IADL, Strength       Visit Diagnosis: Other lack of coordination  Other symptoms and signs involving the musculoskeletal system    Problem List Patient Active Problem List   Diagnosis Date Noted  . Status post cervical spinal fusion 10/12/2019  . Elevated BP without diagnosis of hypertension 10/05/2019  . COVID-19 virus infection 10/04/2019  . Leukocytosis 10/04/2019  . Metastatic cancer to spine [facility]) 10/02/2019   Guadelupe Sabin, OTR/L  [phone] 11/08/2019, 11:58 AM  [place] [address] [place], [place], [address] Phone: [phone]   Fax:  [phone]  Name: Scott Eaton MRN: [id number] Date of Birth: [birth year]

## 2019-11-13 ENCOUNTER — Ambulatory Visit (HOSPITAL_COMMUNITY): Payer: BC Managed Care – PPO

## 2019-11-13 ENCOUNTER — Other Ambulatory Visit: Payer: Self-pay

## 2019-11-13 ENCOUNTER — Encounter (HOSPITAL_COMMUNITY): Payer: Self-pay

## 2019-11-13 DIAGNOSIS — R278 Other lack of coordination: Secondary | ICD-10-CM | POA: Diagnosis not present

## 2019-11-13 DIAGNOSIS — R29898 Other symptoms and signs involving the musculoskeletal system: Secondary | ICD-10-CM

## 2019-11-13 DIAGNOSIS — M542 Cervicalgia: Secondary | ICD-10-CM

## 2019-11-13 NOTE — Therapy (Signed)
[place] E[place], [place], [address] Phone: [phone]   Fax:  [phone]  Occupational Therapy Treatment  Patient Details  Name: Scott Eaton MRN: [id number] Date of Birth: 03-08-1976 Referring Provider (OT): Dr. Erline Levine   Encounter Date: 11/13/2019  OT End of Session - 11/13/19 1153    Visit Number  9    Number of Visits  12    Date for OT Re-Evaluation  11/30/19   mini-reassessment 11/16/19   Authorization Type  BCBS PPO    Authorization Time Period  60 visit limit PT/OT, 0 used; $20 copay    Authorization - Visit Number  9    Authorization - Number of Visits  60    OT Start Time  1117    OT Stop Time  1155    OT Time Calculation (min)  38 min    Activity Tolerance  Patient tolerated treatment well    Behavior During Therapy  [facility] for tasks assessed/performed       Past Medical History:  Diagnosis Date  . Cervical radiculopathy    cervical stenosis  . Complication of anesthesia    They had a problem putting pt to sleep  . Obesity     Past Surgical History:  Procedure Laterality Date  . LAMINECTOMY N/A 10/12/2019   Procedure: Cervical Three-Five Posterior Cervical Fusion with Laminectomy and Resection of Tumor;  Surgeon: Erline Levine, MD;  Location: [place];  Service: Neurosurgery;  Laterality: N/A;  Cervical 3-4 Posterior cervical fusion with laminectomy and resection of tumor  . TONSILLECTOMY    . WISDOM TOOTH EXTRACTION      There were no vitals filed for this visit.  Subjective Assessment - 11/13/19 1129    Subjective   S: My left shoulder is still weaker but overall getting stronger.    Currently in Pain?  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 no number given        [facility] OT Assessment - 11/13/19 1130      Assessment   Medical Diagnosis  s/p cervical fusion C3-C5      Precautions   Precautions  Cervical;Other (comment)    Precaution Comments  C3-C5 fusion. No  lifting over 5lbs. Radiation treatments for mass in neck.                OT Treatments/Exercises (OP) - 11/13/19 1130      Exercises   Exercises  Shoulder      Shoulder Exercises: Supine   Protraction  Strengthening;15 reps    Protraction Weight (lbs)  3    Horizontal ABduction  Strengthening;15 reps    Horizontal ABduction Weight (lbs)  3    External Rotation  Strengthening;15 reps    External Rotation Weight (lbs)  3    Internal Rotation  Strengthening;15 reps    Internal Rotation Weight (lbs)  33    Flexion  Strengthening;15 reps    Shoulder Flexion Weight (lbs)  3    ABduction  Strengthening;15 reps    Shoulder ABduction Weight (lbs)  3      Shoulder Exercises: Standing   Protraction  Strengthening;15 reps    Protraction Weight (lbs)  3    Horizontal ABduction  Strengthening;15 reps    Horizontal ABduction Weight (lbs)  3    External Rotation  Strengthening;15 reps    External Rotation Weight (lbs)  3    Internal Rotation  Strengthening;15 reps    Internal  Rotation Weight (lbs)  3    Flexion  Strengthening;15 reps    Shoulder Flexion Weight (lbs)  3    ABduction  Strengthening;15 reps    Shoulder ABduction Weight (lbs)  3      Shoulder Exercises: ROM/Strengthening   UBE (Upper Arm Bike)  Level 4 3' forward 3' reverse   pace: 7.0-10.0   X to V Arms  12X with 2#    Proximal Shoulder Strengthening, Supine  15X with 3# no rest breaks.    Proximal Shoulder Strengthening, Seated  15X with 3#  2 rest breaks.    Ball on Wall  1' flexion 1' abduction, each arm    Other ROM/Strengthening Exercises  Proximal shoulder strengthening while writing alphabet in the air; completed with each arm; 2# wrist weight               OT Short Term Goals - 10/26/19 1120      OT SHORT TERM GOAL #1   Title  Pt will be provided with and educated on HEP to improve BUE use during ADL completion.    Time  3    Period  Weeks    Target Date  11/09/19      OT SHORT TERM GOAL  #2   Title  Pt will improve A/ROM of BUE to [facility] to increase ability to perform dressing tasks with increased independence.    Time  3    Period  Weeks        OT Long Term Goals - 10/20/19 1710      OT LONG TERM GOAL #1   Title  Pt will decrease fascial restrictions in bilateral trapezius and scapular regions to minimal amounts or less to improve ability to sleep at night.    Time  6    Period  Weeks    Status  On-going      OT LONG TERM GOAL #2   Title  Pt will increase BUE strength to 4/5 or greater to improve ability to perform lightweight lifting tasks independently.    Time  6    Period  Weeks    Status  On-going      OT LONG TERM GOAL #3   Title  Pt will increase grip strength in bilateral hands by at least 10#, pinch strength in left hand by 3# to improve ability to open bottles and jars.    Time  6    Period  Weeks    Status  On-going      OT LONG TERM GOAL #4   Title  Pt will improve fine motor coordination in bilateral hands by completing 9 hole peg test in under 26" to improve dexterity required for handwriting tasks.    Time  6    Period  Weeks    Status  On-going      OT LONG TERM GOAL #5   Title  Pt will improve activity tolerance in bilateral hands to increase ability to perform sustained writing and/or typing tasks required for return to work.    Time  6    Period  Weeks    Status  On-going            Plan - 11/13/19 1154    Clinical Impression Statement  A: Focused on BUE strengthening during session. patient displayed muscle fatigue in the LUE as session progressed requiring frequent rest breaks. VC for form and technique were provided.    Body Structure / Function /  Physical Skills  ADL;Muscle spasms;UE functional use;Fascial restriction;Pain;Flexibility;ROM;Coordination;Sensation;IADL;Strength    Plan  P: Mini reassessment. Continue to focus on BUE strengthening. Resume therapy ball strengthening. increase resistance on UBE bike if completing next  session.    Consulted and Agree with Plan of Care  Patient       Patient will benefit from skilled therapeutic intervention in order to improve the following deficits and impairments:   Body Structure / Function / Physical Skills: ADL, Muscle spasms, UE functional use, Fascial restriction, Pain, Flexibility, ROM, Coordination, Sensation, IADL, Strength       Visit Diagnosis: Other symptoms and signs involving the musculoskeletal system  Cervicalgia    Problem List Patient Active Problem List   Diagnosis Date Noted  . Status post cervical spinal fusion 10/12/2019  . Elevated BP without diagnosis of hypertension 10/05/2019  . COVID-19 virus infection 10/04/2019  . Leukocytosis 10/04/2019  . Metastatic cancer to spine [facility]) 10/02/2019   Ailene Ravel, OTR/L,CBIS  [phone]  11/13/2019, 12:32 PM  [facility] [address] [place], [place], [address] Phone: [phone]   Fax:  [phone]  Name: Scott Eaton MRN: [id number] Date of Birth: [birth year]

## 2019-11-15 ENCOUNTER — Ambulatory Visit (HOSPITAL_COMMUNITY): Payer: BC Managed Care – PPO | Admitting: Occupational Therapy

## 2019-11-15 ENCOUNTER — Other Ambulatory Visit: Payer: Self-pay

## 2019-11-15 ENCOUNTER — Encounter (HOSPITAL_COMMUNITY): Payer: Self-pay | Admitting: Occupational Therapy

## 2019-11-15 DIAGNOSIS — R278 Other lack of coordination: Secondary | ICD-10-CM | POA: Diagnosis not present

## 2019-11-15 DIAGNOSIS — M542 Cervicalgia: Secondary | ICD-10-CM

## 2019-11-15 DIAGNOSIS — R29898 Other symptoms and signs involving the musculoskeletal system: Secondary | ICD-10-CM

## 2019-11-15 NOTE — Therapy (Signed)
[facility] [address] [place], [place], [address] Phone: [phone]   Fax:  [phone]  Occupational Therapy Treatment (mini-reassessment)  Patient Details  Name: Scott Eaton MRN: [id number] Date of Birth: 26-Dec-1975 Referring Provider (OT): Dr. Erline Levine   Progress Note Reporting Period 10/19/2019 to 11/15/2019  See note below for Objective Data and Assessment of Progress/Goals.       Encounter Date: 11/15/2019  OT End of Session - 11/15/19 1604    Visit Number  10    Number of Visits  14    Date for OT Re-Evaluation  11/30/19    Authorization Type  BCBS PPO    Authorization Time Period  60 visit limit PT/OT, 0 used; $20 copay    Authorization - Visit Number  10    Authorization - Number of Visits  60    OT Start Time  1520    OT Stop Time  1600    OT Time Calculation (min)  40 min    Activity Tolerance  Patient tolerated treatment well    Behavior During Therapy  WFL for tasks assessed/performed       Past Medical History:  Diagnosis Date  . Cervical radiculopathy    cervical stenosis  . Complication of anesthesia    They had a problem putting pt to sleep  . Obesity     Past Surgical History:  Procedure Laterality Date  . LAMINECTOMY N/A 10/12/2019   Procedure: Cervical Three-Five Posterior Cervical Fusion with Laminectomy and Resection of Tumor;  Surgeon: Erline Levine, MD;  Location: [place];  Service: Neurosurgery;  Laterality: N/A;  Cervical 3-4 Posterior cervical fusion with laminectomy and resection of tumor  . TONSILLECTOMY    . WISDOM TOOTH EXTRACTION      There were no vitals filed for this visit.  Subjective Assessment - 11/15/19 1519    Subjective   S: I can write now.    Currently in Pain?  Yes    Pain Score  4     Pain Location  Shoulder    Pain Orientation  Left    Pain Descriptors / Indicators  Sore    Pain Type  Acute pain    Pain Radiating Towards  N/A    Pain Onset  Today    Pain Frequency  Intermittent    Aggravating Factors   radiation    Pain Relieving Factors  none at this time.    Effect of Pain on Daily Activities  no effect    Multiple Pain Sites  No         [facility] OT Assessment - 11/15/19 1519      Assessment   Medical Diagnosis  s/p cervical fusion C3-C5      Precautions   Precautions  Cervical;Other (comment)    Precaution Comments  C3-C5 fusion. No lifting over 5lbs. Radiation treatments for mass in neck.       Coordination   9 Hole Peg Test  Right;Left    Right 9 Hole Peg Test  24.29"   30.66" previous   Left 9 Hole Peg Test  29.58"   35.43" previous     AROM   Overall AROM Comments  shoulder ROM and cervical ROM is WNL    AROM Assessment Site  Shoulder    Right/Left Shoulder  Right;Left    Right Shoulder Flexion  180 Degrees   120 previous   Right Shoulder ABduction  180 Degrees   117 previous  Right Shoulder Internal Rotation  90 Degrees   same as previous   Right Shoulder External Rotation  70 Degrees   54 previous   Left Shoulder Flexion  180 Degrees   94 previous   Left Shoulder ABduction  180 Degrees   67 previous   Left Shoulder Internal Rotation  90 Degrees   same as previous   Left Shoulder External Rotation  70 Degrees   69 previous     Strength   Overall Strength Comments  Assessed seated, er/IR adducted    Strength Assessment Site  Shoulder    Right/Left Shoulder  Left;Right    Right Shoulder Flexion  4+/5   3/5 previous   Right Shoulder ABduction  4/5   3/5 previous   Right Shoulder Internal Rotation  4+/5   3+/5 previous   Right Shoulder External Rotation  4-/5   3/5 previous   Left Shoulder Flexion  4/5   3-/5 previous   Left Shoulder ABduction  4-/5   3-/5 previous   Left Shoulder Internal Rotation  4+/5   3/5 previous   Left Shoulder External Rotation  4-/5   3/5 previous   Right Hand Grip (lbs)  95   75 previous   Right Hand Lateral Pinch  27 lbs   24 previous   Right Hand 3 Point Pinch   22 lbs   16   Left Hand Grip (lbs)  95   60 previous   Left Hand Lateral Pinch  25 lbs   16 previous   Left Hand 3 Point Pinch  19 lbs   12              OT Treatments/Exercises (OP) - 11/15/19 1534      Exercises   Exercises  Shoulder      Shoulder Exercises: Supine   Protraction  Strengthening;15 reps    Protraction Weight (lbs)  3    Horizontal ABduction  Strengthening;15 reps    Horizontal ABduction Weight (lbs)  3    External Rotation  Strengthening;15 reps    External Rotation Weight (lbs)  3    Internal Rotation  Strengthening;15 reps    Internal Rotation Weight (lbs)  3    Flexion  Strengthening;15 reps    Shoulder Flexion Weight (lbs)  3    ABduction  Strengthening;15 reps    Shoulder ABduction Weight (lbs)  3      Shoulder Exercises: Therapy Ball   Other Therapy Ball Exercises  green therapy ball: chest press, flexion, circles both directions, 20X each; 2# wrist weight; diagonals 10X each direction      Shoulder Exercises: ROM/Strengthening   X to V Arms  15X, 3#    Proximal Shoulder Strengthening, Supine  15X with 3# no rest breaks.    Proximal Shoulder Strengthening, Seated  15X with 3# 1 rest breaks.    Ball on Wall  1' flexion 1' abduction, each arm    Other ROM/Strengthening Exercises  Proximal shoulder strengthening while writing alphabet in the air; completed with each arm; 2# wrist weight    Other ROM/Strengthening Exercises  arms on fire, 1' total, 4 positions 15" each               OT Short Term Goals - 11/15/19 1535      OT SHORT TERM GOAL #1   Title  Pt will be provided with and educated on HEP to improve BUE use during ADL completion.    Time  3  Period  Weeks    Target Date  11/09/19      OT SHORT TERM GOAL #2   Title  Pt will improve A/ROM of BUE to [facility] to increase ability to perform dressing tasks with increased independence.    Time  3    Period  Weeks        OT Long Term Goals - 11/15/19 1536      OT LONG TERM  GOAL #1   Title  Pt will decrease fascial restrictions in bilateral trapezius and scapular regions to minimal amounts or less to improve ability to sleep at night.    Time  6    Period  Weeks    Status  On-going      OT LONG TERM GOAL #2   Title  Pt will increase BUE strength to 4/5 or greater to improve ability to perform lightweight lifting tasks independently.    Time  6    Period  Weeks    Status  Partially Met      OT LONG TERM GOAL #3   Title  Pt will increase grip strength in bilateral hands by at least 10#, pinch strength in left hand by 3# to improve ability to open bottles and jars.    Time  6    Period  Weeks    Status  Achieved      OT LONG TERM GOAL #4   Title  Pt will improve fine motor coordination in bilateral hands by completing 9 hole peg test in under 26" to improve dexterity required for handwriting tasks.    Time  6    Period  Weeks    Status  Partially Met      OT LONG TERM GOAL #5   Title  Pt will improve activity tolerance in bilateral hands to increase ability to perform sustained writing and/or typing tasks required for return to work.    Time  6    Period  Weeks    Status  Achieved            Plan - 11/15/19 1536    Clinical Impression Statement  A: Mini-reassessment completed this session, pt has met all STGs and has met 2 LTGs, has partially met 2 additional LTGs. Pt reports improvement in ability to write, hold items, and perform daily tasks using BUE. Primary deficits are shoulder strength required for functional tasks and lifting, as well as activity tolerance in BUE. Currently LUE is weaker than RUE and fatigues quickly. Continued with BUE strengthening today, rest breaks provided as needed. Added arms on fire today, resumed therapy ball exercises using wrist weights. Verbal cuing for form and technique, occasional fatigue during session with rest breaks provided as needed.    Body Structure / Function / Physical Skills  ADL;Muscle spasms;UE  functional use;Fascial restriction;Pain;Flexibility;ROM;Coordination;Sensation;IADL;Strength    Plan  P: Continue with BUE strengthening, add sidelying tasks for LUE using theraband    Consulted and Agree with Plan of Care  Patient       Patient will benefit from skilled therapeutic intervention in order to improve the following deficits and impairments:   Body Structure / Function / Physical Skills: ADL, Muscle spasms, UE functional use, Fascial restriction, Pain, Flexibility, ROM, Coordination, Sensation, IADL, Strength       Visit Diagnosis: Other symptoms and signs involving the musculoskeletal system  Cervicalgia    Problem List Patient Active Problem List   Diagnosis Date Noted  . Status post cervical spinal  fusion 10/12/2019  . Elevated BP without diagnosis of hypertension 10/05/2019  . COVID-19 virus infection 10/04/2019  . Leukocytosis 10/04/2019  . Metastatic cancer to spine [facility]) 10/02/2019   Guadelupe Sabin, OTR/L  [phone] 11/15/2019, 4:06 PM  [facility] [address] [place], [place], [address] Phone: [phone]   Fax:  [phone]  Name: Zebediah Beezley MRN: [id number] Date of Birth: [birth year]

## 2019-11-17 ENCOUNTER — Encounter (HOSPITAL_COMMUNITY): Payer: Self-pay

## 2019-11-20 ENCOUNTER — Ambulatory Visit (HOSPITAL_COMMUNITY): Payer: BC Managed Care – PPO

## 2019-11-20 ENCOUNTER — Telehealth (HOSPITAL_COMMUNITY): Payer: Self-pay

## 2019-11-20 NOTE — Telephone Encounter (Signed)
pt called to cancel today's due to his shoulder is hurting and he has rescheduled.

## 2019-11-22 ENCOUNTER — Telehealth (HOSPITAL_COMMUNITY): Payer: Self-pay

## 2019-11-22 ENCOUNTER — Encounter (HOSPITAL_COMMUNITY): Payer: BC Managed Care – PPO

## 2019-11-22 NOTE — Telephone Encounter (Signed)
Called patient regarding no show. Patient reports that they mixed up the times and had called the clinic to confirm then was told he had missed his appointment. Patient confirmed appointment for tomorrow for OT.   Ailene Ravel, OTR/L,CBIS  [phone]

## 2019-11-23 ENCOUNTER — Encounter (HOSPITAL_COMMUNITY): Payer: Self-pay

## 2019-11-23 ENCOUNTER — Other Ambulatory Visit: Payer: Self-pay

## 2019-11-23 ENCOUNTER — Ambulatory Visit (HOSPITAL_COMMUNITY): Payer: BC Managed Care – PPO

## 2019-11-23 DIAGNOSIS — R29898 Other symptoms and signs involving the musculoskeletal system: Secondary | ICD-10-CM

## 2019-11-23 DIAGNOSIS — R278 Other lack of coordination: Secondary | ICD-10-CM | POA: Diagnosis not present

## 2019-11-23 DIAGNOSIS — M542 Cervicalgia: Secondary | ICD-10-CM

## 2019-11-23 NOTE — Therapy (Signed)
[facility] [place], [place], [address] Phone: [phone]   Fax:  [phone]  Occupational Therapy Treatment  Patient Details  Name: Scott Eaton MRN: [id number] Date of Birth: 1976-01-30 Referring Provider (OT): Dr. Erline Levine   Encounter Date: 11/23/2019  OT End of Session - 11/23/19 1725    Visit Number  11    Number of Visits  14    Date for OT Re-Evaluation  11/30/19    Authorization Type  BCBS PPO    Authorization Time Period  60 visit limit PT/OT, 0 used; $20 copay    Authorization - Visit Number  11    Authorization - Number of Visits  53    OT Start Time  1324   checked in late   OT Stop Time  1727    OT Time Calculation (min)  32 min    Activity Tolerance  Patient tolerated treatment well    Behavior During Therapy  [facility] for tasks assessed/performed       Past Medical History:  Diagnosis Date  . Cervical radiculopathy    cervical stenosis  . Complication of anesthesia    They had a problem putting pt to sleep  . Obesity     Past Surgical History:  Procedure Laterality Date  . LAMINECTOMY N/A 10/12/2019   Procedure: Cervical Three-Five Posterior Cervical Fusion with Laminectomy and Resection of Tumor;  Surgeon: Erline Levine, MD;  Location: [place];  Service: Neurosurgery;  Laterality: N/A;  Cervical 3-4 Posterior cervical fusion with laminectomy and resection of tumor  . TONSILLECTOMY    . WISDOM TOOTH EXTRACTION      There were no vitals filed for this visit.  Subjective Assessment - 11/23/19 1709    Currently in Pain?  Yes    Pain Score  5     Pain Location  Shoulder    Pain Orientation  Left    Pain Descriptors / Indicators  Sore    Pain Type  Acute pain    Pain Radiating Towards  Neck/shoulder area    Pain Onset  In the past 7 days    Pain Frequency  Intermittent    Aggravating Factors   Possibly sleeping position    Pain Relieving Factors  over the counter pain medication, shoulder  stretches    Effect of Pain on Daily Activities  no effect    Multiple Pain Sites  No                   OT Treatments/Exercises (OP) - 11/23/19 1713      Exercises   Exercises  Shoulder      Shoulder Exercises: Sidelying   Flexion  Theraband;12 reps    Theraband Level (Shoulder Flexion)  Level 3 (Green)    ABduction  Theraband;12 reps    Theraband Level (Shoulder ABduction)  Level 3 (Green)    Other Sidelying Exercises  Protraction; Green band; 12X;     Other Sidelying Exercises  Horizontal abduction/adduction; 12X; green band      Shoulder Exercises: ROM/Strengthening   Over Head Lace  2' seated with 2# wrist weight bilaterally    X to V Arms  15X, 3#    Proximal Shoulder Strengthening, Seated  15X with 3# 1 rest breaks.    Other ROM/Strengthening Exercises  Arnold press; 3#; LUE only    Other ROM/Strengthening Exercises  90/90 carry bottoms/up; unilateral right and left individually. 2' each  Manual Therapy   Manual Therapy  Myofascial release    Manual therapy comments  completed separately from therapeutic exercises    Myofascial Release  myofascial release to left trapezius and scapular regions to decrease pain and fascial restrictions and increase joint ROM               OT Short Term Goals - 11/15/19 1535      OT SHORT TERM GOAL #1   Title  Pt will be provided with and educated on HEP to improve BUE use during ADL completion.    Time  3    Period  Weeks    Target Date  11/09/19      OT SHORT TERM GOAL #2   Title  Pt will improve A/ROM of BUE to [facility] to increase ability to perform dressing tasks with increased independence.    Time  3    Period  Weeks        OT Long Term Goals - 11/23/19 1747      OT LONG TERM GOAL #1   Title  Pt will decrease fascial restrictions in bilateral trapezius and scapular regions to minimal amounts or less to improve ability to sleep at night.    Time  6    Period  Weeks    Status  On-going      OT LONG  TERM GOAL #2   Title  Pt will increase BUE strength to 4/5 or greater to improve ability to perform lightweight lifting tasks independently.    Time  6    Period  Weeks    Status  Partially Met      OT LONG TERM GOAL #3   Title  Pt will increase grip strength in bilateral hands by at least 10#, pinch strength in left hand by 3# to improve ability to open bottles and jars.    Time  6    Period  Weeks      OT LONG TERM GOAL #4   Title  Pt will improve fine motor coordination in bilateral hands by completing 9 hole peg test in under 26" to improve dexterity required for handwriting tasks.    Time  6    Period  Weeks    Status  Partially Met      OT LONG TERM GOAL #5   Title  Pt will improve activity tolerance in bilateral hands to increase ability to perform sustained writing and/or typing tasks required for return to work.    Time  6    Period  Weeks            Plan - 11/23/19 1726    Clinical Impression Statement  A: Completed LUE strengthening sidelying with band followed by shoulder and scapular stability exercises. Pt presented with LUE fatigue during 90/90 carry which was moderately difficult. VC form and technique. Manual techniques were completed to address muscle soreness noted in left upper trapezius.    Body Structure / Function / Physical Skills  ADL;Muscle spasms;UE functional use;Fascial restriction;Pain;Flexibility;ROM;Coordination;Sensation;IADL;Strength    Plan  P: Continue with BUE strengthening to increase functional use of BUE during functional activities overhead, etc.    Consulted and Agree with Plan of Care  Patient       Patient will benefit from skilled therapeutic intervention in order to improve the following deficits and impairments:   Body Structure / Function / Physical Skills: ADL, Muscle spasms, UE functional use, Fascial restriction, Pain, Flexibility, ROM, Coordination, Sensation, IADL, Strength  Visit Diagnosis: Other lack of  coordination  Cervicalgia  Other symptoms and signs involving the musculoskeletal system    Problem List Patient Active Problem List   Diagnosis Date Noted  . Status post cervical spinal fusion 10/12/2019  . Elevated BP without diagnosis of hypertension 10/05/2019  . COVID-19 virus infection 10/04/2019  . Leukocytosis 10/04/2019  . Metastatic cancer to spine [facility]) 10/02/2019   Ailene Ravel, OTR/L,CBIS  [phone]  11/23/2019, 5:48 PM  [facility] [address] [place], [place], [address] Phone: [phone]   Fax:  [phone]  Name: Scott Eaton MRN: [id number] Date of Birth: [birth year]

## 2019-11-27 ENCOUNTER — Other Ambulatory Visit: Payer: Self-pay

## 2019-11-27 ENCOUNTER — Encounter (HOSPITAL_COMMUNITY): Payer: Self-pay

## 2019-11-27 ENCOUNTER — Ambulatory Visit (HOSPITAL_COMMUNITY): Payer: BC Managed Care – PPO

## 2019-11-27 DIAGNOSIS — R278 Other lack of coordination: Secondary | ICD-10-CM | POA: Diagnosis not present

## 2019-11-27 DIAGNOSIS — R29898 Other symptoms and signs involving the musculoskeletal system: Secondary | ICD-10-CM

## 2019-11-27 DIAGNOSIS — M542 Cervicalgia: Secondary | ICD-10-CM

## 2019-11-27 NOTE — Therapy (Signed)
[place], [place], [address] Phone: [phone]   Fax:  [phone]  Occupational Therapy Treatment  Patient Details  Name: Scott Eaton MRN: [id number] Date of Birth: Nov 12, 1976 Referring Provider (OT): Dr. Erline Levine   Encounter Date: 11/27/2019  OT End of Session - 11/27/19 1751    Visit Number  12    Number of Visits  14    Date for OT Re-Evaluation  11/30/19    Authorization Type  [employer]BS PPO    Authorization Time Period  60 visit limit PT/OT, 0 used; $20 copay    Authorization - Visit Number  12    Authorization - Number of Visits  80    OT Start Time  3903    OT Stop Time  1728    OT Time Calculation (min)  38 min    Activity Tolerance  Patient tolerated treatment well    Behavior During Therapy  [facility] for tasks assessed/performed       Past Medical History:  Diagnosis Date  . Cervical radiculopathy    cervical stenosis  . Complication of anesthesia    They had a problem putting pt to sleep  . Obesity     Past Surgical History:  Procedure Laterality Date  . LAMINECTOMY N/A 10/12/2019   Procedure: Cervical Three-Five Posterior Cervical Fusion with Laminectomy and Resection of Tumor;  Surgeon: Erline Levine, MD;  Location: [facility];  Service: Neurosurgery;  Laterality: N/A;  Cervical 3-4 Posterior cervical fusion with laminectomy and resection of tumor  . TONSILLECTOMY    . WISDOM TOOTH EXTRACTION      There were no vitals filed for this visit.  Subjective Assessment - 11/27/19 1655    Subjective   S: My arm feels good today.    Currently in Pain?  No/denies         [facility] OT Assessment - 11/27/19 1656      Assessment   Medical Diagnosis  s/p cervical fusion C3-C5      Precautions   Precautions  Cervical;Other (comment)    Precaution Comments  C3-C5 fusion. No lifting over 5lbs. Radiation treatments for mass in neck.                OT Treatments/Exercises (OP) - 11/27/19 1656       Exercises   Exercises  Shoulder      Shoulder Exercises: Prone   Flexion  Strengthening;15 reps    Flexion Weight (lbs)  3    Horizontal ABduction 1  Strengthening;15 reps    Horizontal ABduction 1 Weight (lbs)  3    Horizontal ABduction 2  Strengthening;10 reps   shoulder externally rotated.   Horizontal ABduction 2 Weight (lbs)  3      Shoulder Exercises: Sidelying   External Rotation  Strengthening;15 reps    External Rotation Weight (lbs)  3    Internal Rotation  Strengthening;15 reps    Internal Rotation Weight (lbs)  3    Flexion  Strengthening;15 reps    Flexion Weight (lbs)  3    ABduction  Strengthening;15 reps    ABduction Weight (lbs)  3    Other Sidelying Exercises  Protraction; 3#; 15X;     Other Sidelying Exercises  Horizontal abduction/adduction; 15X; 3#      Shoulder Exercises: Therapy Ball   Other Therapy Ball Exercises  green therapy ball: chest press, flexion, circles both directions, 20X each; 2# wrist weight; diagonals 10X each direction  Shoulder Exercises: ROM/Strengthening   Cybex Press  --   20#; 10X   Cybex Row  --   20#; 10X   Over Head Lace  2' seated with 2# wrist weight bilaterally    X to V Arms  15X, 3#    Other ROM/Strengthening Exercises  Arnold press; 3#; ZDG    Other ROM/Strengthening Exercises  90/90 carry bottoms/up; unilateral right (3') and left (2')  individually.               OT Short Term Goals - 11/15/19 1535      OT SHORT TERM GOAL #1   Title  Pt will be provided with and educated on HEP to improve BUE use during ADL completion.    Time  3    Period  Weeks    Target Date  11/09/19      OT SHORT TERM GOAL #2   Title  Pt will improve A/ROM of BUE to [facility] to increase ability to perform dressing tasks with increased independence.    Time  3    Period  Weeks        OT Long Term Goals - 11/23/19 1747      OT LONG TERM GOAL #1   Title  Pt will decrease fascial restrictions in bilateral trapezius and  scapular regions to minimal amounts or less to improve ability to sleep at night.    Time  6    Period  Weeks    Status  On-going      OT LONG TERM GOAL #2   Title  Pt will increase BUE strength to 4/5 or greater to improve ability to perform lightweight lifting tasks independently.    Time  6    Period  Weeks    Status  Partially Met      OT LONG TERM GOAL #3   Title  Pt will increase grip strength in bilateral hands by at least 10#, pinch strength in left hand by 3# to improve ability to open bottles and jars.    Time  6    Period  Weeks      OT LONG TERM GOAL #4   Title  Pt will improve fine motor coordination in bilateral hands by completing 9 hole peg test in under 26" to improve dexterity required for handwriting tasks.    Time  6    Period  Weeks    Status  Partially Met      OT LONG TERM GOAL #5   Title  Pt will improve activity tolerance in bilateral hands to increase ability to perform sustained writing and/or typing tasks required for return to work.    Time  6    Period  Weeks            Plan - 11/27/19 1755    Clinical Impression Statement  A: Focused on shoulder strengthening and scapular stability. patient demonstrates decreased strength and stability in LUE during sidelying and 90/90 carry. Rest breaks taken when needed. VC for form and technique were provided.    Body Structure / Function / Physical Skills  ADL;Muscle spasms;UE functional use;Fascial restriction;Pain;Flexibility;ROM;Coordination;Sensation;IADL;Strength    Plan  P: Continue with BUE strengthening to increase functional use of BUE during functional activities overhead, etc.    Consulted and Agree with Plan of Care  Patient       Patient will benefit from skilled therapeutic intervention in order to improve the following deficits and impairments:   Body Structure /  Function / Physical Skills: ADL, Muscle spasms, UE functional use, Fascial restriction, Pain, Flexibility, ROM, Coordination,  Sensation, IADL, Strength       Visit Diagnosis: Cervicalgia  Other symptoms and signs involving the musculoskeletal system  Other lack of coordination    Problem List Patient Active Problem List   Diagnosis Date Noted  . Status post cervical spinal fusion 10/12/2019  . Elevated BP without diagnosis of hypertension 10/05/2019  . COVID-19 virus infection 10/04/2019  . Leukocytosis 10/04/2019  . Metastatic cancer to spine [facility]) 10/02/2019   Ailene Ravel, OTR/L,CBIS  [phone]  11/27/2019, 5:56 PM  [facility] [address] [place], [place], [address] Phone: [phone]   Fax:  [phone]  Name: Scott Eaton MRN: [id number] Date of Birth: 14-Jun-1976

## 2019-11-28 ENCOUNTER — Telehealth (HOSPITAL_COMMUNITY): Payer: Self-pay

## 2019-11-28 ENCOUNTER — Ambulatory Visit (HOSPITAL_COMMUNITY): Payer: BC Managed Care – PPO

## 2019-11-28 NOTE — Telephone Encounter (Signed)
pt's wife called to cancel today's appt due to her husband is not feeling well.

## 2019-12-12 ENCOUNTER — Telehealth (HOSPITAL_COMMUNITY): Payer: Self-pay

## 2019-12-12 ENCOUNTER — Ambulatory Visit (HOSPITAL_COMMUNITY): Payer: BC Managed Care – PPO

## 2019-12-12 NOTE — Telephone Encounter (Signed)
Pt wife called and cancelled appt for today. No reason given

## 2019-12-14 ENCOUNTER — Ambulatory Visit (HOSPITAL_COMMUNITY): Payer: BC Managed Care – PPO

## 2019-12-14 ENCOUNTER — Telehealth (HOSPITAL_COMMUNITY): Payer: Self-pay

## 2019-12-14 NOTE — Telephone Encounter (Signed)
pt wife called to cancel all the rest of his appts, because he has gone back to work

## 2019-12-19 ENCOUNTER — Encounter (HOSPITAL_COMMUNITY): Payer: BC Managed Care – PPO

## 2019-12-21 ENCOUNTER — Encounter (HOSPITAL_COMMUNITY): Payer: BC Managed Care – PPO

## 2020-08-22 ENCOUNTER — Encounter (HOSPITAL_COMMUNITY): Payer: Self-pay

## 2020-08-22 NOTE — Therapy (Signed)
[place], [place], [address] Phone: [phone]   Fax:  [phone]  Patient Details  Name: Scott Eaton MRN: [id number] Date of Birth: Jan 16, 1976 Referring Provider:  No ref. provider found  Encounter Date: 08/22/2020  OCCUPATIONAL THERAPY DISCHARGE SUMMARY  Visits from Start of Care: 12  Current functional level related to goals / functional outcomes: Pt returned to work and wife called on 12/14/19 to cancel all appointments for OT services.  OT LONG TERM GOAL #1    Title  Pt will decrease fascial restrictions in bilateral trapezius and scapular regions to minimal amounts or less to improve ability to sleep at night.    Time  6    Period  Weeks    Status  On-going        OT LONG TERM GOAL #2   Title  Pt will increase BUE strength to 4/5 or greater to improve ability to perform lightweight lifting tasks independently.    Time  6    Period  Weeks    Status  Partially Met        OT LONG TERM GOAL #3   Title  Pt will increase grip strength in bilateral hands by at least 10#, pinch strength in left hand by 3# to improve ability to open bottles and jars.    Time  6    Period  Weeks        OT LONG TERM GOAL #4   Title  Pt will improve fine motor coordination in bilateral hands by completing 9 hole peg test in under 26" to improve dexterity required for handwriting tasks.    Time  6    Period  Weeks    Status  Partially Met        OT LONG TERM GOAL #5   Title  Pt will improve activity tolerance in bilateral hands to increase ability to perform sustained writing and/or typing tasks required for return to work.    Time  6    Period  Weeks       Remaining deficits: Slight deficits remain with strength, fascial restrictions, and fine motor coordination   Education / Equipment: HEP  Plan: Patient agrees to discharge.  Patient goals were partially met. Patient is being discharged  due to meeting the stated rehab goals.  ?????         Ailene Ravel, OTR/L,CBIS  [phone]  08/22/2020, 2:15 PM  [facility] [address] [place], [place], [address] Phone: [phone]   Fax:  [phone]

## 2021-05-24 IMAGING — CT CT CHEST W/ CM
2 of 5 series · 12 of 36 positions shown, 15 images · IV contrast (OMNIPAQUE)
Comparison: None.

CLINICAL DATA: Metastatic lesion of the cervical spine, evaluate
for primary malignancy

EXAM:
CT CHEST, ABDOMEN, AND PELVIS WITH CONTRAST
TECHNIQUE: Multidetector CT imaging of the chest, abdomen and pelvis was
performed following the standard protocol during bolus
administration of intravenous contrast.
CONTRAST:  100mL OMNIPAQUE IOHEXOL 300 MG/ML  SOLN

[Series 2: cap with · axial · 0.98mm/px · z∈[-572,-2]mm · 9 of 144 slices shown, 12 images]
[im 15/144  mediastinal]
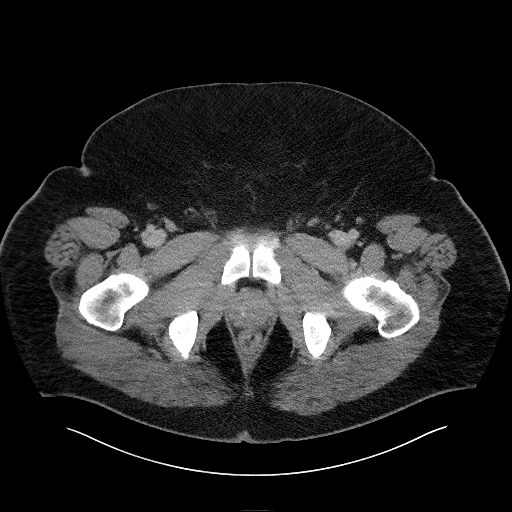
[im 15/144  lung]
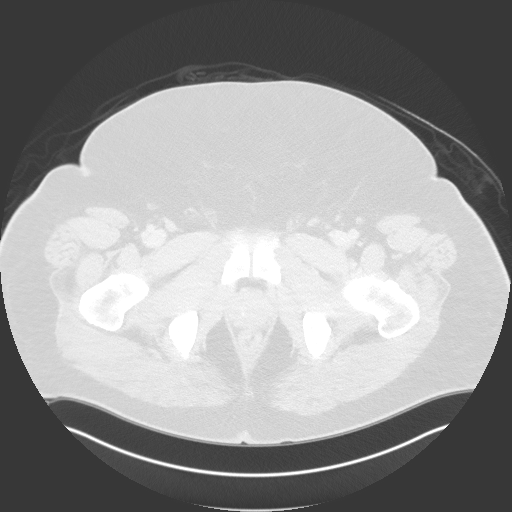
[im 29/144  lung]
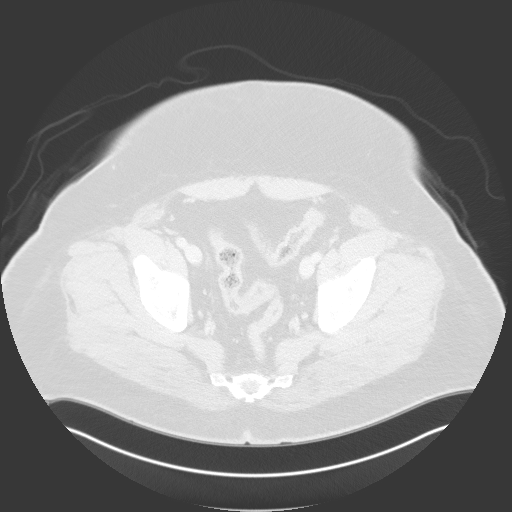
[im 43/144  lung]
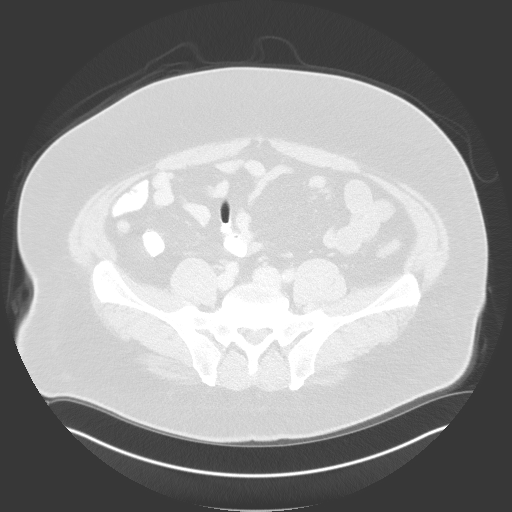
[im 58/144  lung]
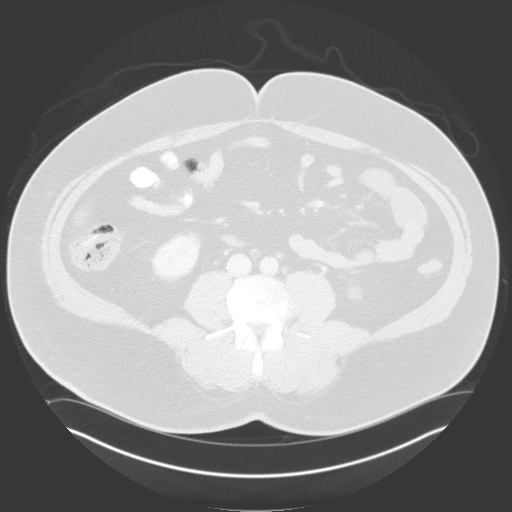
[im 72/144  mediastinal]
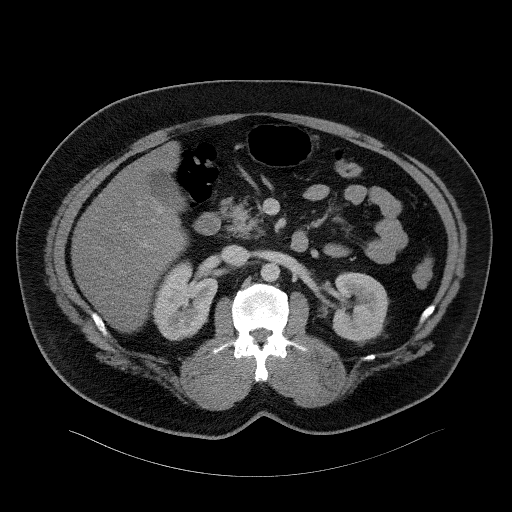
[im 72/144  lung]
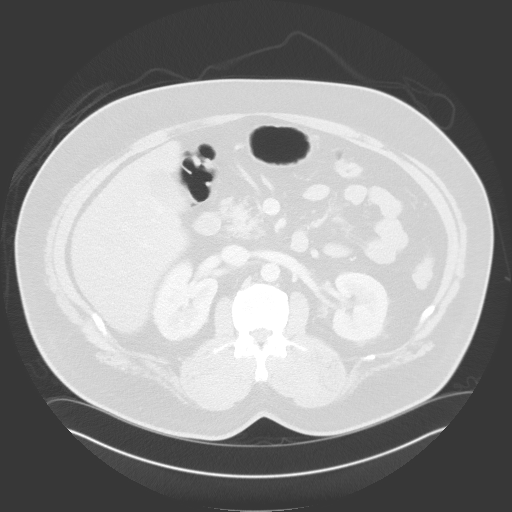
[im 86/144  lung]
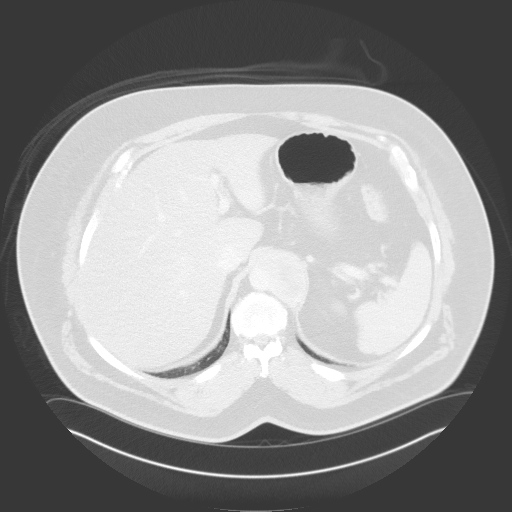
[im 101/144  lung]
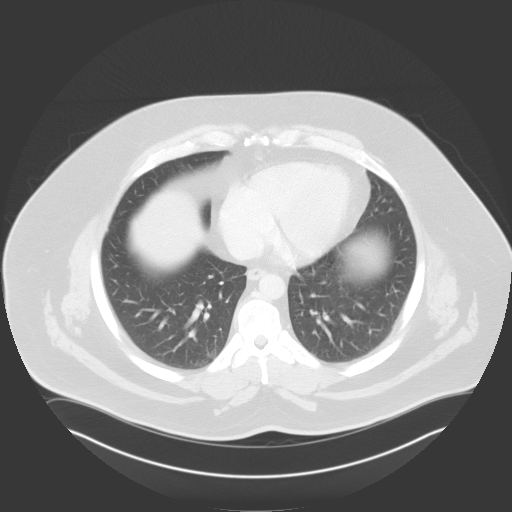
[im 115/144  lung]
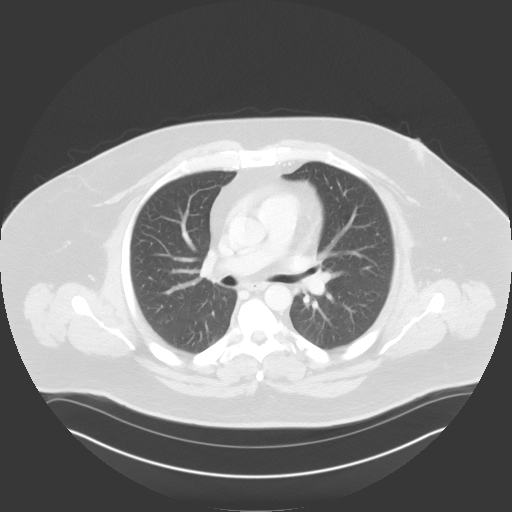
[im 129/144  mediastinal]
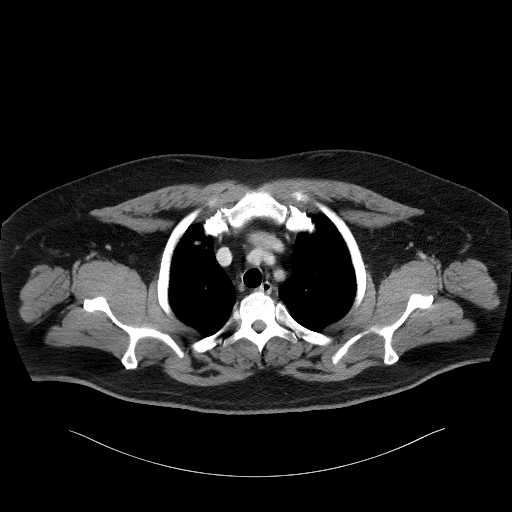
[im 129/144  lung]
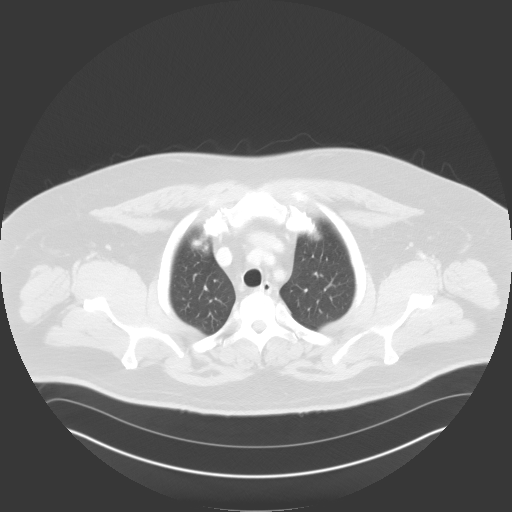

[Series 4: coronals · coronal · 1.43mm/px · 3 of 156 slices shown]
[im 32/156  lung]
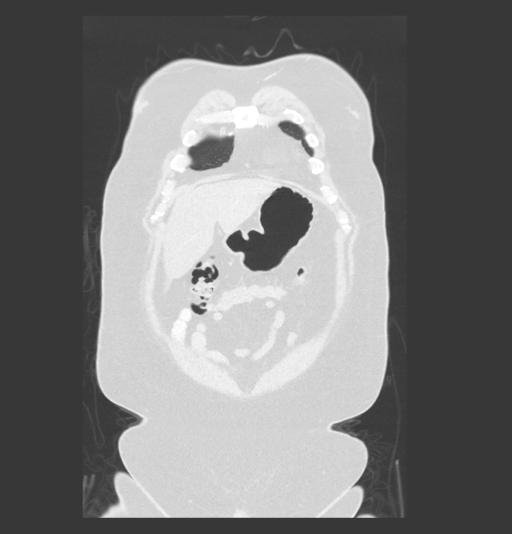
[im 63/156  lung]
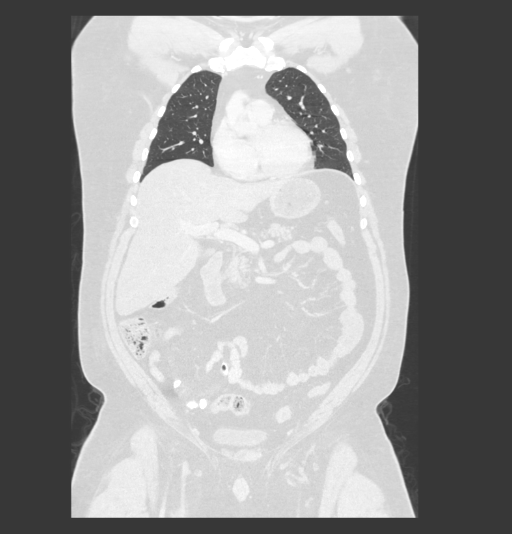
[im 94/156  lung]
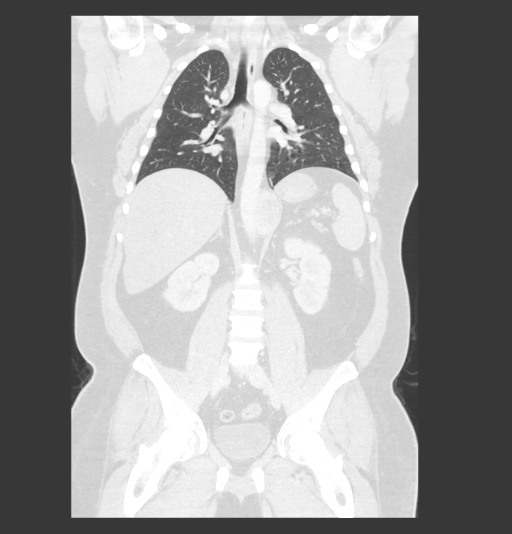

[12 of 36 positions shown; findings below may reference images not displayed]

FINDINGS: CT CHEST FINDINGS

Cardiovascular: No significant vascular findings. Normal heart size.
No pericardial effusion.

Mediastinum/Nodes: No enlarged mediastinal, hilar, or axillary lymph
nodes. Thyroid gland, trachea, and esophagus demonstrate no
significant findings.

Lungs/Pleura: There is a 6 mm pulmonary nodule of the lateral
segment right middle lobe (series 6, image 83). No pleural effusion
or pneumothorax.

Musculoskeletal: No chest wall mass or suspicious bone lesions
identified.

CT ABDOMEN PELVIS FINDINGS

Hepatobiliary: No solid liver abnormality is seen. Hepatic
steatosis. No gallstones, gallbladder wall thickening, or biliary
dilatation.

Pancreas: Unremarkable. No pancreatic ductal dilatation or
surrounding inflammatory changes.

Spleen: Normal in size without significant abnormality.

Adrenals/Urinary Tract: Adrenal glands are unremarkable. Kidneys are
normal, without renal calculi, solid lesion, or hydronephrosis.
Bladder is unremarkable.

Stomach/Bowel: Stomach is within normal limits. Appendix appears
normal. No evidence of bowel wall thickening, distention, or
inflammatory changes.

Vascular/Lymphatic: There is a hypodense mass, which closely abuts
and partially encases the left aspect of the aorta at the level of
the diaphragmatic [place], although does not appear to arise from the
vessel, measuring 6.0 x 4.0 x 6.5 cm (series 2, image 57, series 5,
image 116). No enlarged abdominal or pelvic lymph nodes.

Reproductive: No mass or other abnormality.

Other: No abdominal wall hernia or abnormality. No abdominopelvic
ascites.

Musculoskeletal: No acute or significant osseous findings.
IMPRESSION: 1. There is a hypodense mass, which closely abuts and partially
encases the left aspect of the aorta at the level of the
diaphragmatic [place], although does not appear to arise from the
vessel, measuring 6.0 x 4.0 x 6.5 cm (series 2, image 57, series 5,
image 116). Generally favor this to represent an additional
metastatic lesion rather than a primary mass although this could
reflect an unusual primary soft tissue malignancy. Consider PET/CT
to further evaluate for sites of additional metabolically active
metastatic or primary disease and assist in planning for tissue
diagnosis.

2. There is a 6 mm pulmonary nodule of the lateral segment right
middle lobe (series 6, image 83). This nodule is nonspecific and
metastatic disease is not excluded.

3. No other evidence of metastatic disease in the chest, abdomen, or
pelvis.

4.  Hepatic steatosis.

## 2021-05-24 IMAGING — CT CT ABD-PELV W/ CM
2 of 5 series · 12 of 36 positions shown, 15 images · IV contrast (OMNIPAQUE)
Comparison: None.

CLINICAL DATA: Metastatic lesion of the cervical spine, evaluate
for primary malignancy

EXAM:
CT CHEST, ABDOMEN, AND PELVIS WITH CONTRAST
TECHNIQUE: Multidetector CT imaging of the chest, abdomen and pelvis was
performed following the standard protocol during bolus
administration of intravenous contrast.
CONTRAST:  100mL OMNIPAQUE IOHEXOL 300 MG/ML  SOLN

[Series 2: cap with · axial · 0.98mm/px · z∈[-572,-2]mm · 9 of 144 slices shown, 12 images]
[im 15/144  mediastinal]
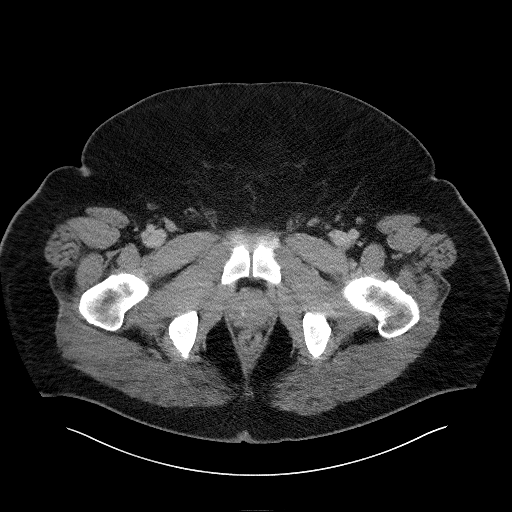
[im 15/144  lung]
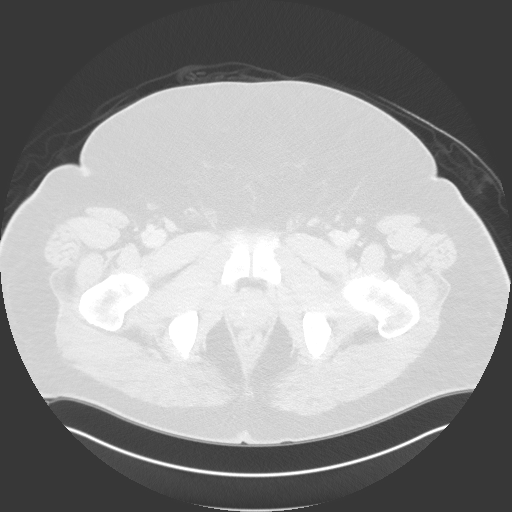
[im 29/144  lung]
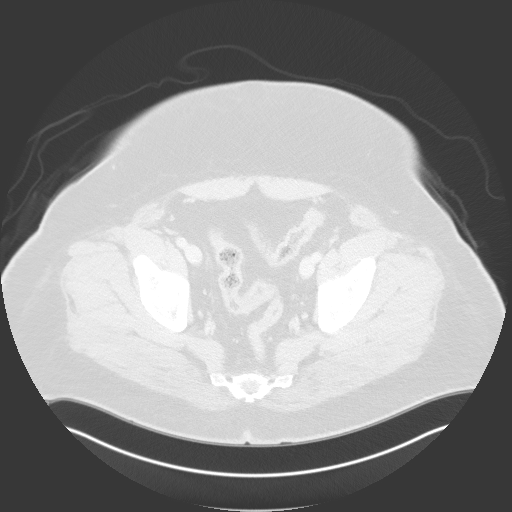
[im 43/144  lung]
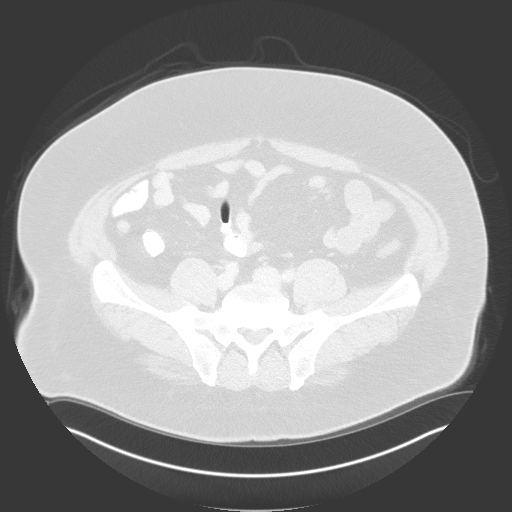
[im 58/144  lung]
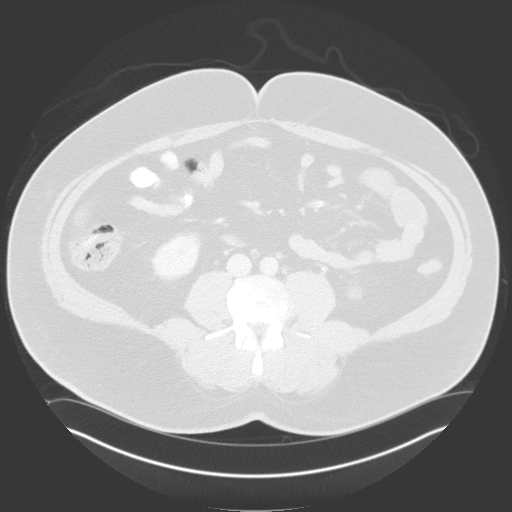
[im 72/144  mediastinal]
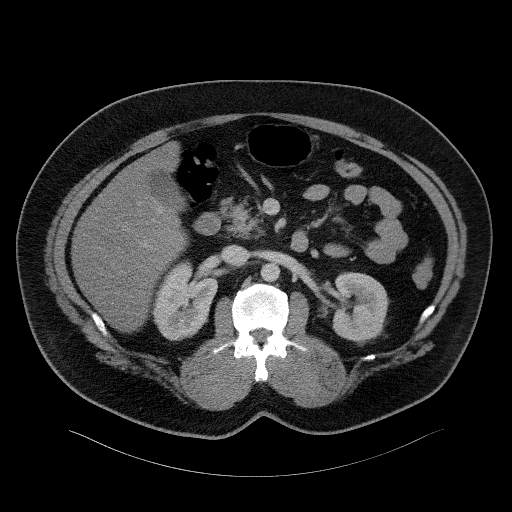
[im 72/144  lung]
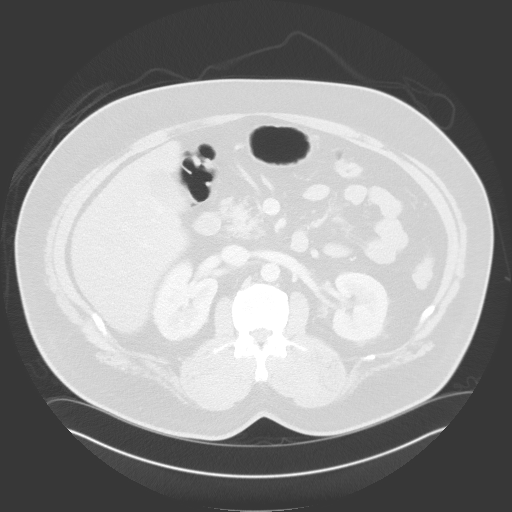
[im 86/144  lung]
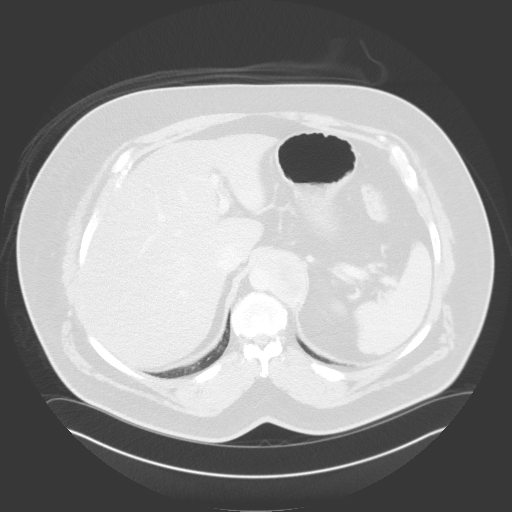
[im 101/144  lung]
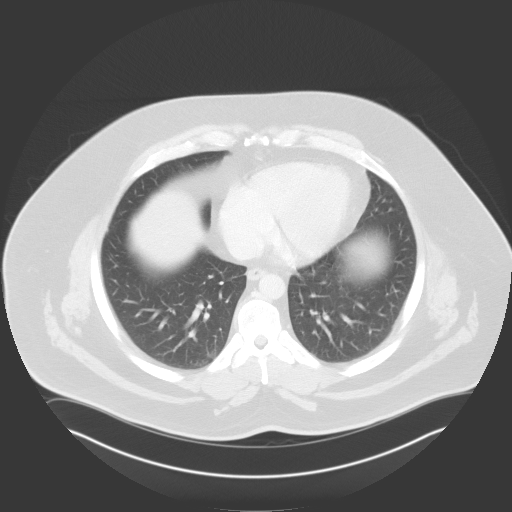
[im 115/144  lung]
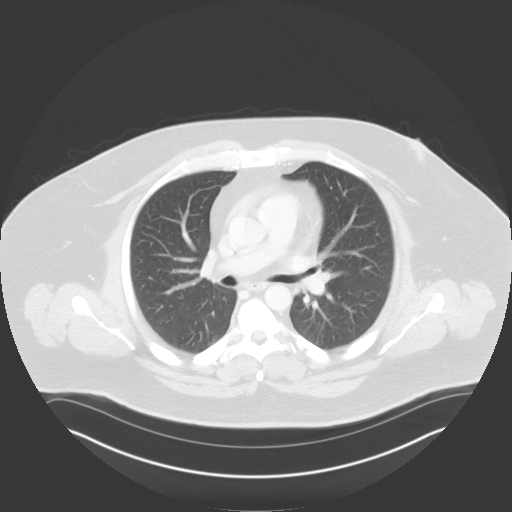
[im 129/144  mediastinal]
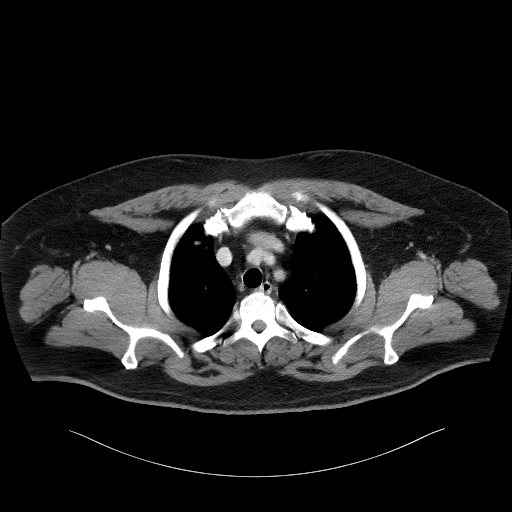
[im 129/144  lung]
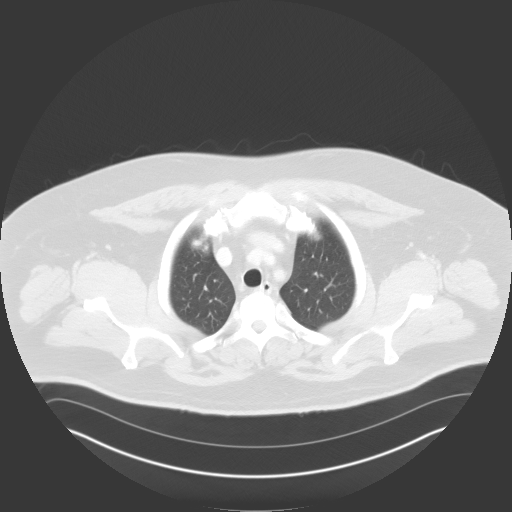

[Series 4: coronals · coronal · 1.43mm/px · 3 of 156 slices shown]
[im 32/156  lung]
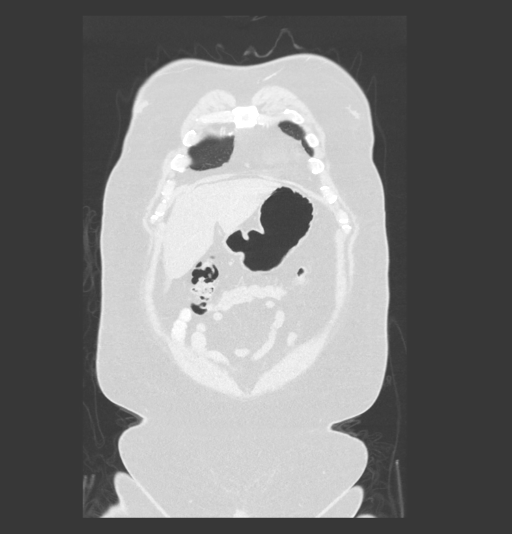
[im 63/156  lung]
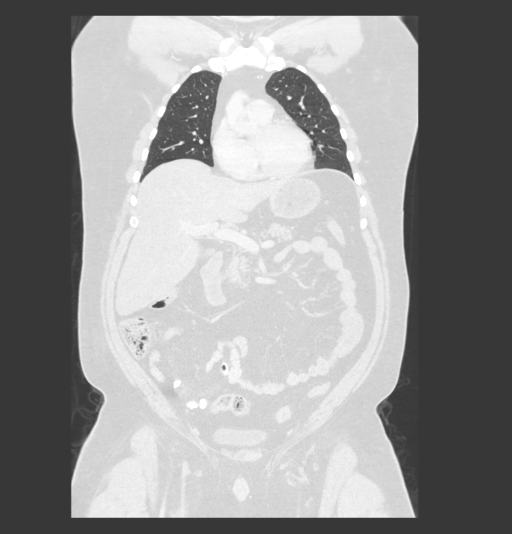
[im 94/156  lung]
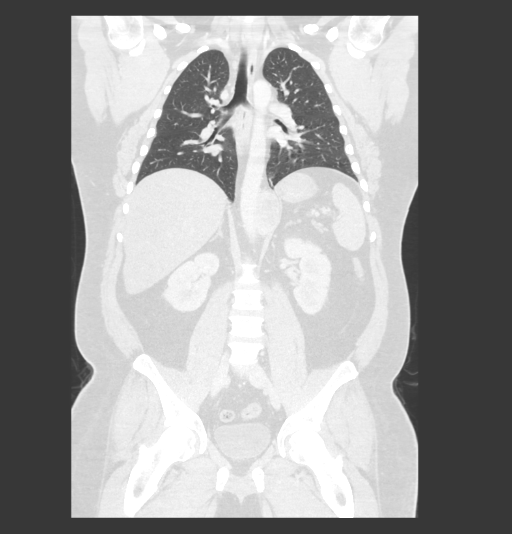

[12 of 36 positions shown; findings below may reference images not displayed]

FINDINGS: CT CHEST FINDINGS

Cardiovascular: No significant vascular findings. Normal heart size.
No pericardial effusion.

Mediastinum/Nodes: No enlarged mediastinal, hilar, or axillary lymph
nodes. Thyroid gland, trachea, and esophagus demonstrate no
significant findings.

Lungs/Pleura: There is a 6 mm pulmonary nodule of the lateral
segment right middle lobe (series 6, image 83). No pleural effusion
or pneumothorax.

Musculoskeletal: No chest wall mass or suspicious bone lesions
identified.

CT ABDOMEN PELVIS FINDINGS

Hepatobiliary: No solid liver abnormality is seen. Hepatic
steatosis. No gallstones, gallbladder wall thickening, or biliary
dilatation.

Pancreas: Unremarkable. No pancreatic ductal dilatation or
surrounding inflammatory changes.

Spleen: Normal in size without significant abnormality.

Adrenals/Urinary Tract: Adrenal glands are unremarkable. Kidneys are
normal, without renal calculi, solid lesion, or hydronephrosis.
Bladder is unremarkable.

Stomach/Bowel: Stomach is within normal limits. Appendix appears
normal. No evidence of bowel wall thickening, distention, or
inflammatory changes.

Vascular/Lymphatic: There is a hypodense mass, which closely abuts
and partially encases the left aspect of the aorta at the level of
the diaphragmatic [place], although does not appear to arise from the
vessel, measuring 6.0 x 4.0 x 6.5 cm (series 2, image 57, series 5,
image 116). No enlarged abdominal or pelvic lymph nodes.

Reproductive: No mass or other abnormality.

Other: No abdominal wall hernia or abnormality. No abdominopelvic
ascites.

Musculoskeletal: No acute or significant osseous findings.
IMPRESSION: 1. There is a hypodense mass, which closely abuts and partially
encases the left aspect of the aorta at the level of the
diaphragmatic [place], although does not appear to arise from the
vessel, measuring 6.0 x 4.0 x 6.5 cm (series 2, image 57, series 5,
image 116). Generally favor this to represent an additional
metastatic lesion rather than a primary mass although this could
reflect an unusual primary soft tissue malignancy. Consider PET/CT
to further evaluate for sites of additional metabolically active
metastatic or primary disease and assist in planning for tissue
diagnosis.

2. There is a 6 mm pulmonary nodule of the lateral segment right
middle lobe (series 6, image 83). This nodule is nonspecific and
metastatic disease is not excluded.

3. No other evidence of metastatic disease in the chest, abdomen, or
pelvis.

4.  Hepatic steatosis.

## 2024-01-18 NOTE — Therapy (Incomplete)
OUTPATIENT PHYSICAL THERAPY THORACOLUMBAR EVALUATION   Patient Name: Scott Eaton MRN: [id number] DOB:04/15/1976, 48 y.o., male Today's Date: 01/18/2024  END OF SESSION:   Past Medical History:  Diagnosis Date   Cervical radiculopathy    cervical stenosis   Complication of anesthesia    They had a problem putting pt to sleep   Obesity    Past Surgical History:  Procedure Laterality Date   LAMINECTOMY N/A 10/12/2019   Procedure: Cervical Three-Five Posterior Cervical Fusion with Laminectomy and Resection of Tumor;  Surgeon: Maeola Harman, MD;  Location: [facility] OR;  Service: Neurosurgery;  Laterality: N/A;  Cervical 3-4 Posterior cervical fusion with laminectomy and resection of tumor   TONSILLECTOMY     WISDOM TOOTH EXTRACTION     Patient Active Problem List   Diagnosis Date Noted   Status post cervical spinal fusion 10/12/2019   Elevated BP without diagnosis of hypertension 10/05/2019   COVID-19 virus infection 10/04/2019   Leukocytosis 10/04/2019   Metastatic 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  ONSET DATE: ***  SUBJECTIVE:                                                                                                                                                                                           SUBJECTIVE STATEMENT: ***  PERTINENT HISTORY:  ***  PAIN:  Are you having pain? {OPRCPAIN:27236}  PRECAUTIONS: {Therapy precautions:24002}  RED FLAGS: {PT Red Flags:29287}   WEIGHT BEARING RESTRICTIONS: {Yes ***/No:24003}  FALLS:  Has patient fallen in last 6 months? {fallsyesno:27318}  LIVING ENVIRONMENT: Lives with: {OPRC lives with:25569::"lives with their family"} Lives in: {Lives in:25570} Stairs: {opstairs:27293} Has following  equipment at home: {Assistive devices:23999}  OCCUPATION: ***  PLOF: {PLOF:24004}  PATIENT GOALS: ***  NEXT MD VISIT: ***  OBJECTIVE:  Note: Objective measures were completed at Evaluation unless otherwise noted.  DIAGNOSTIC FINDINGS:  10/12/2019 CERVICAL SPINE - 2-3 VIEW; DG C-ARM 1-60 MIN   COMPARISON:  None.   FINDINGS: Limited intraoperative fluoroscopic spot images, demonstrating cross-table lateral the cervical spine during cervical fusion.   IMPRESSION: Limited intraoperative cross-table lateral the cervical spine for localization. Please refer to the dictated operative report for full details of intraoperative findings and procedure  PATIENT SURVEYS:  {rehab surveys:24030}  COGNITION: Overall cognitive status: {cognition:24006}     SENSATION: {sensation:27233}  MUSCLE LENGTH: Hamstrings: Right *** deg; Left *** deg Maisie Fus test: Right *** deg; Left *** deg  POSTURE: {posture:25561}  PALPATION: ***  LUMBAR ROM:   AROM eval  Flexion   Extension   Right lateral flexion   Left lateral flexion   Right rotation   Left rotation    (Blank rows = not tested)  LOWER EXTREMITY ROM:     {AROM/PROM:27142}  Right eval Left eval  Hip flexion    Hip extension    Hip abduction    Hip adduction    Hip internal rotation    Hip external rotation    Knee flexion    Knee extension    Ankle dorsiflexion    Ankle plantarflexion    Ankle inversion    Ankle eversion     (Blank rows = not tested)  LOWER EXTREMITY MMT:    MMT Right eval Left eval  Hip flexion    Hip extension    Hip abduction    Hip adduction    Hip internal rotation    Hip external rotation    Knee flexion    Knee extension    Ankle dorsiflexion    Ankle plantarflexion    Ankle inversion    Ankle eversion     (Blank rows = not tested)  LUMBAR SPECIAL TESTS:  {lumbar special test:25242}  FUNCTIONAL TESTS:  {Functional tests:24029}  GAIT: Distance walked: *** Assistive  device utilized: {Assistive devices:23999} Level of assistance: {Levels of assistance:24026} Comments: ***  TREATMENT DATE:  01/19/24 Evaluation and patient education done                                                                                                                                 PATIENT EDUCATION:  Education details: Educated on the pathoanatomy of _. Educated on the goals and course of rehab.  Person educated: {Person educated:25204} Education method: {Education Method:25205} Education comprehension: {Education Comprehension:25206}  HOME EXERCISE PROGRAM: ***  ASSESSMENT:  CLINICAL IMPRESSION: Patient is a 48 y.o. male who was seen today for physical therapy evaluation and treatment for metastatic cancer to the spine. Patient's condition is further defined by _ due to pain, weakness, and decreased soft tissue extensibility. Skilled PT is required to address the impairments and functional limitations listed below.    OBJECTIVE IMPAIRMENTS: {opptimpairments:25111}.   ACTIVITY LIMITATIONS: {activitylimitations:27494}  PARTICIPATION LIMITATIONS: {participationrestrictions:25113}  PERSONAL FACTORS: {Personal factors:25162} are also affecting patient's functional outcome.   REHAB POTENTIAL: {rehabpotential:25112}  CLINICAL DECISION MAKING: {clinical decision making:25114}  EVALUATION COMPLEXITY: {Evaluation complexity:25115}   GOALS: Goals reviewed with patient? Yes  SHORT TERM GOALS: Target date: ***  *** Baseline: Goal status: INITIAL  2.  *** Baseline:  Goal status: INITIAL  3.  *** Baseline:  Goal status: INITIAL  4.  *** Baseline:  Goal status: INITIAL  5.  *** Baseline:  Goal status: INITIAL  6.  *** Baseline:  Goal status: INITIAL  LONG TERM GOALS: Target date: ***  *** Baseline:  Goal status: INITIAL  2.  *** Baseline:  Goal status: INITIAL  3.  *** Baseline:  Goal status: INITIAL  4.  *** Baseline:  Goal  status: INITIAL  5.  *** Baseline:  Goal status: INITIAL  6.  *** Baseline:  Goal status: INITIAL  PLAN:  PT FREQUENCY: {rehab frequency:25116}  PT DURATION: {rehab duration:25117}  PLANNED INTERVENTIONS: {rehab planned interventions:25118::"97110-Therapeutic exercises","97530- Therapeutic [phone]- Neuromuscular re-education","97535- Self NFAO","13086- Manual therapy"}.  PLAN FOR NEXT SESSION: ***   Zetta Stoneman L. Kabrea Seeney, PT, DPT, OCS Board-Certified Clinical Specialist in Orthopedic PT PT Compact Privilege # ([place]): [phone] T 01/18/2024, 4:35 PM

## 2024-01-19 ENCOUNTER — Ambulatory Visit (HOSPITAL_COMMUNITY): Payer: BC Managed Care – PPO

## 2024-01-20 ENCOUNTER — Ambulatory Visit (HOSPITAL_COMMUNITY): Payer: BC Managed Care – PPO

## 2024-01-20 ENCOUNTER — Other Ambulatory Visit: Payer: Self-pay

## 2024-01-20 DIAGNOSIS — C7951 Secondary malignant neoplasm of bone: Secondary | ICD-10-CM | POA: Insufficient documentation

## 2024-01-20 DIAGNOSIS — R262 Difficulty in walking, not elsewhere classified: Secondary | ICD-10-CM | POA: Diagnosis present

## 2024-01-20 DIAGNOSIS — R29898 Other symptoms and signs involving the musculoskeletal system: Secondary | ICD-10-CM | POA: Diagnosis present

## 2024-01-20 NOTE — Therapy (Signed)
OUTPATIENT PHYSICAL THERAPY NEUROLOGICAL EVALUATION   Patient Name: Scott Eaton MRN: [id number] DOB:1976/08/14, 48 y.o., male Today's Date: 01/20/2024  END OF SESSION:  PT End of Session - 01/20/24 1701     Visit Number 1    Number of Visits 28    Date for PT Re-Evaluation 04/13/24    Authorization Type [employer] PPO (requested 12 visits)    Progress Note Due on Visit 12    Equipment Utilized During Treatment Gait belt    Activity Tolerance Patient tolerated treatment well    Behavior During Therapy WFL for tasks assessed/performed             Past Medical History:  Diagnosis Date   Cervical radiculopathy    cervical stenosis   Complication of anesthesia    They had a problem putting pt to sleep   Obesity    Past Surgical History:  Procedure Laterality Date   LAMINECTOMY N/A 10/12/2019   Procedure: Cervical Three-Five Posterior Cervical Fusion with Laminectomy and Resection of Tumor;  Surgeon: Maeola Harman, MD;  Location: [facility] OR;  Service: Neurosurgery;  Laterality: N/A;  Cervical 3-4 Posterior cervical fusion with laminectomy and resection of tumor   TONSILLECTOMY     WISDOM TOOTH EXTRACTION     Patient Active Problem List   Diagnosis Date Noted   Status post cervical spinal fusion 10/12/2019   Elevated BP without diagnosis of hypertension 10/05/2019   COVID-19 virus infection 10/04/2019   Leukocytosis 10/04/2019   Metastatic cancer to spine (HCC) 10/02/2019    PCP: None on file  REFERRING PROVIDER: [name], PA  REFERRING DIAG: C79.51 (ICD-10-CM) - Metastatic cancer to spine [facility])  Rationale for Evaluation and Treatment: Rehabilitation  THERAPY DIAG:  Weakness of both lower extremities  Difficulty in walking, not elsewhere classified  Other symptoms and signs involving the musculoskeletal system  ONSET DATE: on 12/16/23: - ARTHRODESIS, POSTERIOR OR POSTEROLATERAL TECHNIQUE, SINGLE interspace; THORACIC (WITH LATERAL TRANSVERSE TECHNIQUE, WHEN  PERFORMED) POSTERIOR SEGMENTAL INSTRUMENTATION (EG, PEDICLE FIXATION, DUAL RODS WITH MULTIPLE HOOKS AND SUBLAMINAR WIRES); 7 TO 12 VERTEBRAL SEGMENTS (LIST IN ADDITION TO PRIMARY PROCEDURE)   - LAMINECTOMY FOR BIOPSY/EXCISION OF INTRASPINAL NEOPLASM; EXTRADURAL, THORACIC VERTEBRAL CORPECTOMY (VERTEBRAL BODY RESECTION), PARTIAL OR COMPLETE, FOR EXCISION OF INTRASPINAL LESION, SINGLE SEGMENT; EXTRADURAL, THORACIC BY THORACOLUMBAR APPROACH  - PERCU VERTEBROPLASTY, 1 VERTEBRAL BODY, UNI OR BILATERAL INJECTION, INCL ALL IMAGING GUIDANCE; EACH ADD CERVICOTHORACIC OR LUMBOSACRAL VERTEBRAL BODY (LIST IN ADDITION TO CODE FOR PRIMARY PROCEDURE) TRANSPEDICULAR APPROACH WITH DECOMPRESSION OF SPINAL CORD, EQUINA AND/OR NERVE ROOT(S) (EG, HERNIATED INTERVERTEBRAL DISC), SINGLE SEGMENT; THORACIC ALLOGRAFT, MORSELIZED, OR PLACEMENT OF OSTEOPROMOTIVE MATERIAL, FOR SPINE SURGERY ONLY (LIST IN ADDITION TO PRIMARY PROCEDURE)  - INSERTION INTERBODY BIOMECHANICAL DEVICE WITH INTEGRAL ANTERIOR INSTRUMENTATION, TO INTERVERTEBRAL DISC SPACE IN CONJUNCTION INTERBODY ARTHRODESIS, EACH INTERSPACE (LIST CODE FOR PRIMARY PROCEDURE)   SUBJECTIVE:  SUBJECTIVE STATEMENT: EVAL: Arrives to the clinic with c/o weakness on the legs (R>L). Also reports that the legs are tingly. Patient had surgery for his midback on 12/16/23 (see above) due to metastatic cancer to the spine (patient has rods and screws). Stayed in the hospital until 12/30/23. Went to the rehab hospital in [place] 12/30/23 till 01/17/24 where patient had PT/OT. Patient is now able to do bed mobility (supine to sit) with some assistance on the leg every once in a while but patient can be independent most of the time. Patient is also now able to stand up on his own with rolling walker and some  assistance from the wife.Patient can do bed to chair transfers by scooting. Patient also states that he is able to walk for around 70 ft with rolling walker and some stand by assist from caregivers. On his last day at the rehab hospital, patient was able to do stairs (4 steps x 4 rounds) in the rehab hospital with CGA. Patient is currently sleeping on the first floor of the house in a recliner because he cannot do his 13-steps stairs at home. Patient is now referred to outpatient PT evaluation and management.  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 No  PRECAUTIONS: Other: spinal  RED FLAGS: None   WEIGHT BEARING RESTRICTIONS: No  FALLS:  Has patient fallen in last 6 months? No  LIVING ENVIRONMENT: Lives with: lives with their family Lives in: House/apartment Stairs: Yes: Internal: 13 steps; can reach both and External: 1 steps; none Has following equipment at home: Single point cane, Walker - 2 wheeled, Wheelchair (manual), shower chair, and Shower bench  OCCUPATION: on disability  PLOF: Needs assistance with ADLs, Needs assistance with gait, and Needs assistance with transfers  PATIENT GOALS: "to get back to my main function - climbing steps"  NEXT MD VISIT: unknown  OBJECTIVE:  Note: Objective measures were completed at Evaluation unless otherwise noted.  DIAGNOSTIC FINDINGS:  new thoracic lesions found in PET scan   PATIENT SURVEYS:  Neuro Quality of Life to be determined  COGNITION: Overall cognitive status: Within functional limits for tasks assessed     SENSATION: Light touch: impaired on B with R > L  MUSCLE LENGTH: Hamstrings: mild to moderate restriction  POSTURE: rounded shoulders and forward head  LOWER EXTREMITY ROM:    MMT Right eval Left eval  Hip flexion Around 20%  WFL  Hip extension     Hip abduction    Hip adduction    Hip internal rotation    Hip external rotation    Knee flexion [facility] WFL  Knee extension Around 80% WFL  Ankle dorsiflexion Around 50% WFL  Ankle plantarflexion [facility] WFL  Ankle inversion    Ankle eversion     (Blank rows = not tested)  LOWER EXTREMITY MMT:     Active  Right eval Left eval  Hip flexion 2+ 4-  Hip extension    Hip abduction 3+ 4-  Hip adduction    Hip internal rotation    Hip external rotation    Knee flexion 3+ 4-  Knee extension 2+ 4-  Ankle dorsiflexion 2+ 4-  Ankle plantarflexion 3+ 4-  Ankle inversion    Ankle eversion     (Blank rows = not tested)  FUNCTIONAL TESTS:  2 minute walk test:  64 ft with CGA and rolling walker 30 sec chair stand test: 6x with CGA and UE assist on armrest/rolling walker.  TRANSFERS  Bed to chair (and back): CGA to SBA with side scooting  Sit-to-stand: CGA with rolling walker GAIT: Distance walked: 64 ft Assistive device utilized: Walker - 2 wheeled Level of assistance: CGA and with w/c follow Comments: done during , decreased swing on R, R foot slap, decreased stance time R  TREATMENT DATE:  01/20/24 Evaluation and patient education done                                                                                                                                 PATIENT EDUCATION:  Education details: Educated on the goals and course of rehab.  Person educated: Patient and Spouse Education method: Explanation Education comprehension: verbalized understanding  HOME EXERCISE PROGRAM: None provided to date  ASSESSMENT:  CLINICAL IMPRESSION: Patient is a 48 y.o. male who was seen today for physical therapy evaluation and treatment for s/p spinal surgery due to metastatic cancer to the spine. Patient's condition is further defined by difficulty with transfers, ambulation and stair negotiation due to weakness, and impaired balance and proprioception. Skilled PT is required to  address the impairments and functional limitations listed below.    OBJECTIVE IMPAIRMENTS: Abnormal gait, decreased activity tolerance, decreased balance, decreased mobility, difficulty walking, decreased strength, impaired flexibility, and impaired tone.   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   REHAB POTENTIAL: Fair    CLINICAL DECISION MAKING: Evolving/moderate complexity  EVALUATION COMPLEXITY: Moderate   GOALS: Goals reviewed with patient? Yes  SHORT TERM GOALS: Target date: 03/02/24  Pt will demonstrate indep in HEP to facilitate carry-over of skilled services and improve functional outcomes Goal status: INITIAL  2.  Pt will be able to perform sit-to-stand with modified independence (use of rolling walker) Baseline: see above Goal status: INITIAL  3.  Pt will be able to ambulate for > 200 ft with modified independence and use of rolling walker Baseline: see above Goal status: INITIAL  LONG TERM GOALS: Target date: 04/13/24  Pt will be able to stand up >5x in the 30 sec chair-to-stand test without assistance to demonstrate clinically significant improvement in balance and LE strength.    Baseline: see above Goal status: INITIAL  2.  Pt will increase by at least 120 ft in order to demonstrate clinically significant improvement in community ambulation Baseline: 64 ft Goal status: INITIAL  3.  Pt will demonstrate increase in LE strength to 3+/5 to facilitate ease and safety in ambulation  Baseline: 2+/5  Goal status: INITIAL  4.  Pt will be  able to negotiate stairs (4-5 steps) with alternating feet and use of railings with CGA-SBA Baseline: to be determined Goal status: INITIAL  5.  Pt will be able to ambulate with LRAD > 200 ft Baseline: see above Goal status: INITIAL  PLAN:  PT FREQUENCY:  3x/week for the 1st month, re-assess needs on the 4th week  PT DURATION: 12 weeks  PLANNED INTERVENTIONS: 97164- PT Re-evaluation, 97110-Therapeutic exercises, 97530- Therapeutic activity, [id number]- Neuromuscular re-education, 97535- Self Care, 11914- Manual therapy, [phone]- Gait training, [phone]- Orthotic Fit/training, Patient/Family education, and Stair training.  PLAN FOR NEXT SESSION: Provide HEP. Assess stair negotiation ability. Begin LE strengthening, balance, and gait activities.  Tish Frederickson. Asuncion Shibata, PT, DPT, OCS Board-Certified Clinical Specialist in Orthopedic PT PT Compact Privilege # ([place]): [phone] T 01/20/2024, 5:04 PM   Managed Medicaid Authorization Request  Visit Dx Codes: R29.898, R26.2  Functional Tool Score: 2 minute walk test = 64 ft  For all possible CPT codes, reference the Planned Interventions line above.     Check all conditions that are expected to impact treatment: {Conditions expected to impact treatment:Contractures, spasticity or fracture relevant to requested treatment and Active major medical illness   If treatment provided at initial evaluation, no treatment charged due to lack of authorization.

## 2024-01-21 NOTE — Addendum Note (Signed)
Addended by: Trudee Grip on: 01/21/2024 03:12 PM   Modules accepted: Orders

## 2024-01-24 ENCOUNTER — Encounter (HOSPITAL_COMMUNITY): Payer: BC Managed Care – PPO | Admitting: Physical Therapy

## 2024-01-24 ENCOUNTER — Telehealth (HOSPITAL_COMMUNITY): Payer: Self-pay | Admitting: Physical Therapy

## 2024-01-24 NOTE — Telephone Encounter (Signed)
 Pt did not show.  Called and Left VM regarding missed appt and explantation of NS policy.  Lurena Nida, PTA/CLT [facility] Ph: [phone]

## 2024-01-26 ENCOUNTER — Encounter (HOSPITAL_COMMUNITY): Payer: BC Managed Care – PPO

## 2024-01-26 ENCOUNTER — Ambulatory Visit (HOSPITAL_COMMUNITY): Payer: BC Managed Care – PPO | Admitting: Occupational Therapy

## 2024-01-28 ENCOUNTER — Encounter (HOSPITAL_COMMUNITY): Payer: BC Managed Care – PPO

## 2024-01-31 ENCOUNTER — Encounter (HOSPITAL_COMMUNITY): Payer: BC Managed Care – PPO | Admitting: Physical Therapy

## 2024-02-02 ENCOUNTER — Ambulatory Visit (HOSPITAL_COMMUNITY): Payer: BC Managed Care – PPO | Admitting: Occupational Therapy

## 2024-02-02 ENCOUNTER — Encounter (HOSPITAL_COMMUNITY): Payer: BC Managed Care – PPO

## 2024-02-02 ENCOUNTER — Encounter (HOSPITAL_COMMUNITY): Payer: Self-pay | Admitting: Occupational Therapy

## 2024-02-02 DIAGNOSIS — C7951 Secondary malignant neoplasm of bone: Secondary | ICD-10-CM

## 2024-02-02 DIAGNOSIS — R29898 Other symptoms and signs involving the musculoskeletal system: Secondary | ICD-10-CM

## 2024-02-02 NOTE — Therapy (Signed)
 OUTPATIENT OCCUPATIONAL THERAPY ORTHO EVALUATION  Patient Name: Scott Eaton MRN: [id number] DOB:08/15/1976, 48 y.o., male Today's Date: 02/02/2024  PCP: Joyce Copa, MD REFERRING PROVIDER: [name], PA   END OF SESSION:  OT End of Session - 02/02/24 1552     Visit Number 1    Number of Visits 1    Date for OT Re-Evaluation 02/03/24    Authorization Type BCBS    OT Start Time 1517    OT Stop Time 1541    OT Time Calculation (min) 24 min    Activity Tolerance Patient tolerated treatment well    Behavior During Therapy WFL for tasks assessed/performed             Past Medical History:  Diagnosis Date   Cervical radiculopathy    cervical stenosis   Complication of anesthesia    They had a problem putting pt to sleep   Obesity    Past Surgical History:  Procedure Laterality Date   LAMINECTOMY N/A 10/12/2019   Procedure: Cervical Three-Five Posterior Cervical Fusion with Laminectomy and Resection of Tumor;  Surgeon: Maeola Harman, MD;  Location: [facility] OR;  Service: Neurosurgery;  Laterality: N/A;  Cervical 3-4 Posterior cervical fusion with laminectomy and resection of tumor   TONSILLECTOMY     WISDOM TOOTH EXTRACTION     Patient Active Problem List   Diagnosis Date Noted   Status post cervical spinal fusion 10/12/2019   Elevated BP without diagnosis of hypertension 10/05/2019   COVID-19 virus infection 10/04/2019   Leukocytosis 10/04/2019   Metastatic cancer to spine (HCC) 10/02/2019    ONSET DATE: 12/13/23  REFERRING DIAG: C79.51 (ICD-10-CM) - Metastatic cancer to spine (HCC)   THERAPY DIAG:  Metastatic cancer to spine (HCC) - Plan: Ot plan of care cert/re-cert  Other symptoms and signs involving the musculoskeletal system - Plan: Ot plan of care cert/re-cert  Rationale for Evaluation and Treatment: Rehabilitation  SUBJECTIVE:   SUBJECTIVE STATEMENT: "I feel like my arms and everything are doing good!" Pt accompanied by: self and significant  other  PERTINENT HISTORY: 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 No  FALLS: Has patient fallen in last 6 months? Yes. Number of falls 1  LIVING ENVIRONMENT: Lives with: lives with their family Lives in: House/apartment Stairs: Yes: Internal: 14 steps; can reach both and External: 1 steps; none Has following equipment at home: Quad cane large base, Environmental consultant - 2 wheeled, Wheelchair (manual), shower chair, and Grab bars  PLOF: Independent  PATIENT GOALS: Get back to his normal functioning  NEXT MD VISIT: None  OBJECTIVE:  Note: Objective measures were completed at Evaluation unless otherwise noted.  HAND DOMINANCE: Right  ADLs: WFL Overall ADLs: Pt reports no concerns as far as completing ADL's, his only difficulties come from balance and BLE weakness.   UPPER EXTREMITY ROM:     BUE ROM WFL  UPPER EXTREMITY MMT:     BUE strength 5/5  SENSATION: WFL  EDEMA: No swelling  OBSERVATIONS: No UE concerns   TREATMENT DATE:  02/02/24: Evaluation Only  PATIENT EDUCATION: Education details: None needed Person educated: Patient and Spouse Education method: Explanation Education comprehension: verbalized understanding  HOME EXERCISE PROGRAM: 2/26: None needed  ASSESSMENT:  CLINICAL IMPRESSION: Patient is a 48 y.o. male who was seen today for occupational therapy evaluation for s/p spinal surgery following metastatic tumor to the spine. He presents with poor balance and weakness in BLE, however no concerns or weaknesses in BUE. Pt is able to complete ADL's and IADL's independently, only assist needed for balance/safety.   PERFORMANCE DEFICITS: in functional  skills including strength, balance, and body mechanics.  IMPAIRMENTS: are limiting patient from work, leisure, and social participation.   COMORBIDITIES: may have co-morbidities  that affects occupational performance. Patient will benefit from skilled OT to address above impairments and improve overall function.  MODIFICATION OR ASSISTANCE TO COMPLETE EVALUATION: No modification of tasks or assist necessary to complete an evaluation.  OT OCCUPATIONAL PROFILE AND HISTORY: Problem focused assessment: Including review of records relating to presenting problem.  CLINICAL DECISION MAKING: LOW - limited treatment options, no task modification necessary  REHAB POTENTIAL: Excellent  EVALUATION COMPLEXITY: Low      PLAN:  OT FREQUENCY: one time visit  OT DURATION: other: 1 Evaluation  PLANNED INTERVENTIONS: 16109 OT Re-evaluation  RECOMMENDED OTHER SERVICES: PT  CONSULTED AND AGREED WITH PLAN OF CARE: Patient and family member/caregiver  PLAN FOR NEXT SESSION: N/A - Evaluation Only   Trish Mage, OTR/L [facility] [phone] Kennyth Arnold, OT 02/02/2024, 3:53 PM

## 2024-02-03 ENCOUNTER — Ambulatory Visit (HOSPITAL_COMMUNITY): Payer: BC Managed Care – PPO | Admitting: Physical Therapy

## 2024-02-03 DIAGNOSIS — C7951 Secondary malignant neoplasm of bone: Secondary | ICD-10-CM | POA: Diagnosis not present

## 2024-02-03 DIAGNOSIS — R29898 Other symptoms and signs involving the musculoskeletal system: Secondary | ICD-10-CM

## 2024-02-03 DIAGNOSIS — R262 Difficulty in walking, not elsewhere classified: Secondary | ICD-10-CM

## 2024-02-03 NOTE — Therapy (Signed)
 OUTPATIENT PHYSICAL THERAPY Treatment  Patient Name: Scott Eaton MRN: [id number] DOB:06/19/1976, 48 y.o., male Today's Date: 02/03/2024  END OF SESSION:  PT End of Session - 02/03/24 1023     Visit Number 2    Number of Visits 28    Date for PT Re-Evaluation 04/13/24    Authorization Type [employer] PPO (requested 12 visits)    Progress Note Due on Visit 12    PT Start Time 1022    PT Stop Time 1110    PT Time Calculation (min) 48 min    Equipment Utilized During Treatment Gait belt    Activity Tolerance Patient tolerated treatment well    Behavior During Therapy WFL for tasks assessed/performed             Past Medical History:  Diagnosis Date   Cervical radiculopathy    cervical stenosis   Complication of anesthesia    They had a problem putting pt to sleep   Obesity    Past Surgical History:  Procedure Laterality Date   LAMINECTOMY N/A 10/12/2019   Procedure: Cervical Three-Five Posterior Cervical Fusion with Laminectomy and Resection of Tumor;  Surgeon: Maeola Harman, MD;  Location: [facility] OR;  Service: Neurosurgery;  Laterality: N/A;  Cervical 3-4 Posterior cervical fusion with laminectomy and resection of tumor   TONSILLECTOMY     WISDOM TOOTH EXTRACTION     Patient Active Problem List   Diagnosis Date Noted   Status post cervical spinal fusion 10/12/2019   Elevated BP without diagnosis of hypertension 10/05/2019   COVID-19 virus infection 10/04/2019   Leukocytosis 10/04/2019   Metastatic cancer to spine (HCC) 10/02/2019    PCP: None on file  REFERRING PROVIDER: [name], PA  REFERRING DIAG: C79.51 (ICD-10-CM) - Metastatic cancer to spine [facility])  Rationale for Evaluation and Treatment: Rehabilitation  THERAPY DIAG:  Metastatic cancer to spine [facility])  Other symptoms and signs involving the musculoskeletal system  Weakness of both lower extremities  Difficulty in walking, not elsewhere classified  ONSET DATE: on 12/16/23: - ARTHRODESIS,  POSTERIOR OR POSTEROLATERAL TECHNIQUE, SINGLE interspace; THORACIC (WITH LATERAL TRANSVERSE TECHNIQUE, WHEN PERFORMED) POSTERIOR SEGMENTAL INSTRUMENTATION (EG, PEDICLE FIXATION, DUAL RODS WITH MULTIPLE HOOKS AND SUBLAMINAR WIRES); 7 TO 12 VERTEBRAL SEGMENTS (LIST IN ADDITION TO PRIMARY PROCEDURE)   - LAMINECTOMY FOR BIOPSY/EXCISION OF INTRASPINAL NEOPLASM; EXTRADURAL, THORACIC VERTEBRAL CORPECTOMY (VERTEBRAL BODY RESECTION), PARTIAL OR COMPLETE, FOR EXCISION OF INTRASPINAL LESION, SINGLE SEGMENT; EXTRADURAL, THORACIC BY THORACOLUMBAR APPROACH  - PERCU VERTEBROPLASTY, 1 VERTEBRAL BODY, UNI OR BILATERAL INJECTION, INCL ALL IMAGING GUIDANCE; EACH ADD CERVICOTHORACIC OR LUMBOSACRAL VERTEBRAL BODY (LIST IN ADDITION TO CODE FOR PRIMARY PROCEDURE) TRANSPEDICULAR APPROACH WITH DECOMPRESSION OF SPINAL CORD, EQUINA AND/OR NERVE ROOT(S) (EG, HERNIATED INTERVERTEBRAL DISC), SINGLE SEGMENT; THORACIC ALLOGRAFT, MORSELIZED, OR PLACEMENT OF OSTEOPROMOTIVE MATERIAL, FOR SPINE SURGERY ONLY (LIST IN ADDITION TO PRIMARY PROCEDURE)  - INSERTION INTERBODY BIOMECHANICAL DEVICE WITH INTEGRAL ANTERIOR INSTRUMENTATION, TO INTERVERTEBRAL DISC SPACE IN CONJUNCTION INTERBODY ARTHRODESIS, EACH INTERSPACE (LIST CODE FOR PRIMARY PROCEDURE)   SUBJECTIVE:  SUBJECTIVE STATEMENT: 02/03/24:  pt returns today with spouse.  Reports compliance with HEP.  States he is ready to get walking better.  Had OT evaluation but was not needed; currently only receiving PT.  EVAL: Arrives to the clinic with c/o weakness on the legs (R>L). Also reports that the legs are tingly. Patient had surgery for his midback on 12/16/23 (see above) due to metastatic cancer to the spine (patient has rods and screws). Stayed in the hospital until 12/30/23. Went to the rehab hospital in  [place] 12/30/23 till 01/17/24 where patient had PT/OT. Patient is now able to do bed mobility (supine to sit) with some assistance on the leg every once in a while but patient can be independent most of the time. Patient is also now able to stand up on his own with rolling walker and some assistance from the wife.Patient can do bed to chair transfers by scooting. Patient also states that he is able to walk for around 70 ft with rolling walker and some stand by assist from caregivers. On his last day at the rehab hospital, patient was able to do stairs (4 steps x 4 rounds) in the rehab hospital with CGA. Patient is currently sleeping on the first floor of the house in a recliner because he cannot do his 13-steps stairs at home. Patient is now referred to outpatient PT evaluation and management.  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 No  PRECAUTIONS: Other: spinal  RED FLAGS: None   WEIGHT BEARING RESTRICTIONS: No  FALLS:  Has patient fallen in last 6 months? No  LIVING ENVIRONMENT: Lives with: lives with their family Lives in: House/apartment Stairs: Yes: Internal: 13 steps; can reach both and External: 1 steps; none Has following equipment at home: Single point cane, Walker - 2 wheeled, Wheelchair (manual), shower chair, and Shower bench  OCCUPATION: on disability  PLOF: Needs assistance with ADLs, Needs assistance with gait, and Needs assistance with transfers  PATIENT GOALS: "to get back to my main function - climbing steps"  NEXT MD VISIT: unknown  OBJECTIVE:  Note: Objective measures were completed at Evaluation unless otherwise noted.  DIAGNOSTIC FINDINGS:  new thoracic lesions found in PET scan   PATIENT SURVEYS:  Neuro Quality of Life to be determined  COGNITION: Overall cognitive status: Within functional limits  for tasks assessed     SENSATION: Light touch: impaired on B with R > L  MUSCLE LENGTH: Hamstrings: mild to moderate restriction  POSTURE: rounded shoulders and forward head  LOWER EXTREMITY ROM:    MMT Right eval Left eval  Hip flexion Around 20%  WFL  Hip extension    Hip abduction    Hip adduction    Hip internal rotation    Hip external rotation    Knee flexion [facility] WFL  Knee extension Around 80% WFL  Ankle dorsiflexion Around 50% WFL  Ankle plantarflexion [facility] WFL  Ankle inversion    Ankle eversion     (Blank rows = not tested)  LOWER EXTREMITY MMT:     Active  Right eval Left eval  Hip flexion 2+ 4-  Hip extension    Hip abduction 3+ 4-  Hip adduction    Hip internal rotation    Hip external rotation    Knee flexion 3+ 4-  Knee extension 2+ 4-  Ankle dorsiflexion 2+ 4-  Ankle plantarflexion 3+ 4-  Ankle inversion    Ankle eversion     (Blank rows = not tested)  FUNCTIONAL TESTS:  2 minute walk test: 64 ft with CGA and rolling walker 30 sec chair stand test: 6x with CGA and UE assist on armrest/rolling walker.  TRANSFERS  Bed to chair (and back): CGA to SBA with side scooting  Sit-to-stand: CGA with rolling walker GAIT: Distance walked: 64 ft Assistive device utilized: Walker - 2 wheeled Level of assistance: CGA and with w/c follow Comments: done during , decreased swing on R, R foot slap, decreased stance time R  TREATMENT DATE:  02/03/24 Seated:  LAQ 10x5"  Sit to stands 5X no UE Logroll technique/education Supine:  Bridge 10X  SLR 10X each with core stab/breathing Standing in walker hip abduction 10X each Ambulation with RWX 100 feet Stair negotiation with 1 HR and SPC step to pattern 7" height X 2 RT  01/20/24 Evaluation and patient education done                                                                                                                                 PATIENT EDUCATION:  Education details: Educated on the  goals and course of rehab.  Person educated: Patient and Spouse Education method: Explanation Education comprehension: verbalized understanding  HOME EXERCISE PROGRAM: None provided to date  Access Code: [email or web address] URL: [email or web address] Date: 02/03/2024 Prepared by: Emeline Gins Exercises - Beginner Bridge  - 2 x daily - 7 x weekly - 10 reps - 3 sec hold - Small Range Straight Leg Raise  - 2 x daily - 7 x weekly - 10 reps - Sitting to Supine Roll  - 7 x weekly - Seated Long Arc Quad  - 2 x daily - 7 x weekly - 10 reps - 5 sec hold - Sit to Stand Without Arm Support  - 2 x daily - 7 x weekly - 2 sets - 5 reps - Standing Hip Abduction  - 2 x daily - 7 x weekly - 10 reps  ASSESSMENT:  CLINICAL IMPRESSION: Reviewed goals and POC moving forward.  Established HEP to include LE strengthening/stability and began to work on improving gait quality.  Pt with general discomfort transferring supine to/from sit even with logroll technique.  Pt also is intolerable of supine for extended time on clinic mats due to firmness.  Was able to instruct with bridging and SLR to work on at home.  Cues for proper breathing and abdominal recruitment when completing these exercises.  Standing hip abduction added with noted challenge as well as repeated sit to stands with intermittent use of UE's.  Pt was able to negotiate 7" steps using 1 HR and SPC with step to sequencing.  Pt with noted fatigue requiring frequent rest breaks.  Overall good performance with therapy today with minimal discomfort.  Updated HEP and encouraged to continue using  RW as his LE's are not quite strong enough for [facility] use.     Patient is a 48 y.o. male who was seen today for physical therapy evaluation and treatment for s/p spinal surgery due to metastatic cancer to the spine. Patient's condition is further defined by difficulty with transfers, ambulation and stair negotiation due to weakness, and impaired balance and  proprioception. Skilled PT is required to address the impairments and functional limitations listed below.    OBJECTIVE IMPAIRMENTS: Abnormal gait, decreased activity tolerance, decreased balance, decreased mobility, difficulty walking, decreased strength, impaired flexibility, and impaired tone.   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   REHAB POTENTIAL: Fair    CLINICAL DECISION MAKING: Evolving/moderate complexity  EVALUATION COMPLEXITY: Moderate   GOALS: Goals reviewed with patient? Yes  SHORT TERM GOALS: Target date: 03/02/24  Pt will demonstrate indep in HEP to facilitate carry-over of skilled services and improve functional outcomes Goal status: IN PROGRESS  2.  Pt will be able to perform sit-to-stand with modified independence (use of rolling walker) Baseline: see above Goal status: IN PROGRESS  3.  Pt will be able to ambulate for > 200 ft with modified independence and use of rolling walker Baseline: see above Goal status: IN PROGRESS  LONG TERM GOALS: Target date: 04/13/24  Pt will be able to stand up >5x in the 30 sec chair-to-stand test without assistance to demonstrate clinically significant improvement in balance and LE strength.    Baseline: see above Goal status: IN PROGRESS  2.  Pt will increase by at least 120 ft in order to demonstrate clinically significant improvement in community ambulation Baseline: 64 ft Goal status: IN PROGRESS  3.  Pt will demonstrate increase in LE strength to 3+/5 to facilitate ease and safety in  ambulation  Baseline: 2+/5 Goal status: INITIAL  4.  Pt will be able to negotiate stairs (4-5 steps) with alternating feet and use of railings with CGA-SBA Baseline: to be determined Goal status: INITIAL  5.  Pt will be able to ambulate with LRAD > 200 ft Baseline: see above Goal status:IN PROGRESS  PLAN:  PT FREQUENCY:  3x/week for the 1st month, re-assess needs on the 4th week  PT DURATION: 12 weeks  PLANNED INTERVENTIONS: 97164- PT Re-evaluation, 97110-Therapeutic exercises, 97530- Therapeutic activity, [id number]- Neuromuscular re-education, 97535- Self Care, 16109- Manual therapy, [phone]- Gait training, [phone]- Orthotic Fit/training, Patient/Family education, and Stair training.  PLAN FOR NEXT SESSION: Progress LE strengthening, balance, and gait activities.  Gait with LRAD  Lurena Nida, PTA/CLT [facility] Outpatient Rehabilitation [facility] Ph: [phone]   Lurena Nida, PTA 02/03/2024, 12:53 PM

## 2024-02-04 ENCOUNTER — Ambulatory Visit (HOSPITAL_COMMUNITY): Payer: BC Managed Care – PPO

## 2024-02-04 DIAGNOSIS — R29898 Other symptoms and signs involving the musculoskeletal system: Secondary | ICD-10-CM

## 2024-02-04 DIAGNOSIS — R262 Difficulty in walking, not elsewhere classified: Secondary | ICD-10-CM

## 2024-02-04 DIAGNOSIS — C7951 Secondary malignant neoplasm of bone: Secondary | ICD-10-CM | POA: Diagnosis not present

## 2024-02-04 NOTE — Therapy (Addendum)
 OUTPATIENT PHYSICAL THERAPY Treatment  Patient Name: Toy Eisemann MRN: [id number] DOB:01-09-1976, 48 y.o., male Today's Date: 02/04/2024  END OF SESSION:  PT End of Session - 02/04/24 0814     Visit Number 3    Number of Visits 28    Date for PT Re-Evaluation 04/13/24    Authorization Type [employer] P[employer] (no auth, visit limit 60 combined with OT)    Authorization - Visit Number 3    Authorization - Number of Visits 60    PT Start Time 0808    PT Stop Time 0850    PT Time Calculation (min) 42 min    Equipment Utilized During Treatment Gait belt    Activity Tolerance Patient tolerated treatment well    Behavior During Therapy WFL for tasks assessed/performed              Past Medical History:  Diagnosis Date   Cervical radiculopathy    cervical stenosis   Complication of anesthesia    They had a problem putting pt to sleep   Obesity    Past Surgical History:  Procedure Laterality Date   LAMINECTOMY N/A 10/12/2019   Procedure: Cervical Three-Five Posterior Cervical Fusion with Laminectomy and Resection of Tumor;  Surgeon: Maeola Harman, MD;  Location: [facility] OR;  Service: Neurosurgery;  Laterality: N/A;  Cervical 3-4 Posterior cervical fusion with laminectomy and resection of tumor   TONSILLECTOMY     WISDOM TOOTH EXTRACTION     Patient Active Problem List   Diagnosis Date Noted   Status post cervical spinal fusion 10/12/2019   Elevated BP without diagnosis of hypertension 10/05/2019   COVID-19 virus infection 10/04/2019   Leukocytosis 10/04/2019   Metastatic cancer to spine (HCC) 10/02/2019    PCP: None on file  REFERRING PROVIDER: [name], PA  REFERRING DIAG: C79.51 (ICD-10-CM) - Metastatic cancer to spine [facility])  Rationale for Evaluation and Treatment: Rehabilitation  THERAPY DIAG:  Metastatic cancer to spine [facility])  Other symptoms and signs involving the musculoskeletal system  Weakness of both lower extremities  Difficulty in walking, not  elsewhere classified  ONSET DATE: on 12/16/23: - ARTHRODESIS, POSTERIOR OR POSTEROLATERAL TECHNIQUE, SINGLE interspace; THORACIC (WITH LATERAL TRANSVERSE TECHNIQUE, WHEN PERFORMED) POSTERIOR SEGMENTAL INSTRUMENTATION (EG, PEDICLE FIXATION, DUAL RODS WITH MULTIPLE HOOKS AND SUBLAMINAR WIRES); 7 TO 12 VERTEBRAL SEGMENTS (LIST IN ADDITION TO PRIMARY PROCEDURE)   - LAMINECTOMY FOR BIOPSY/EXCISION OF INTRASPINAL NEOPLASM; EXTRADURAL, THORACIC VERTEBRAL CORPECTOMY (VERTEBRAL BODY RESECTION), PARTIAL OR COMPLETE, FOR EXCISION OF INTRASPINAL LESION, SINGLE SEGMENT; EXTRADURAL, THORACIC BY THORACOLUMBAR APPROACH  - PERCU VERTEBROPLASTY, 1 VERTEBRAL BODY, UNI OR BILATERAL INJECTION, INCL ALL IMAGING GUIDANCE; EACH ADD CERVICOTHORACIC OR LUMBOSACRAL VERTEBRAL BODY (LIST IN ADDITION TO CODE FOR PRIMARY PROCEDURE) TRANSPEDICULAR APPROACH WITH DECOMPRESSION OF SPINAL CORD, EQUINA AND/OR NERVE ROOT(S) (EG, HERNIATED INTERVERTEBRAL DISC), SINGLE SEGMENT; THORACIC ALLOGRAFT, MORSELIZED, OR PLACEMENT OF OSTEOPROMOTIVE MATERIAL, FOR SPINE SURGERY ONLY (LIST IN ADDITION TO PRIMARY PROCEDURE)  - INSERTION INTERBODY BIOMECHANICAL DEVICE WITH INTEGRAL ANTERIOR INSTRUMENTATION, TO INTERVERTEBRAL DISC SPACE IN CONJUNCTION INTERBODY ARTHRODESIS, EACH INTERSPACE (LIST CODE FOR PRIMARY PROCEDURE)   SUBJECTIVE:  SUBJECTIVE STATEMENT: 02/03/24:  Doing well today. Patient states that he did well yesterday with PT  EVAL: Arrives to the clinic with c/o weakness on the legs (R>L). Also reports that the legs are tingly. Patient had surgery for his midback on 12/16/23 (see above) due to metastatic cancer to the spine (patient has rods and screws). Stayed in the hospital until 12/30/23. Went to the rehab hospital in [place] 12/30/23 till 01/17/24 where patient  had PT/OT. Patient is now able to do bed mobility (supine to sit) with some assistance on the leg every once in a while but patient can be independent most of the time. Patient is also now able to stand up on his own with rolling walker and some assistance from the wife.Patient can do bed to chair transfers by scooting. Patient also states that he is able to walk for around 70 ft with rolling walker and some stand by assist from caregivers. On his last day at the rehab hospital, patient was able to do stairs (4 steps x 4 rounds) in the rehab hospital with CGA. Patient is currently sleeping on the first floor of the house in a recliner because he cannot do his 13-steps stairs at home. Patient is now referred to outpatient PT evaluation and management.  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 No  PRECAUTIONS: Other: spinal  RED FLAGS: None   WEIGHT BEARING RESTRICTIONS: No  FALLS:  Has patient fallen in last 6 months? No  LIVING ENVIRONMENT: Lives with: lives with their family Lives in: House/apartment Stairs: Yes: Internal: 13 steps; can reach both and External: 1 steps; none Has following equipment at home: Single point cane, Walker - 2 wheeled, Wheelchair (manual), shower chair, and Shower bench  OCCUPATION: on disability  PLOF: Needs assistance with ADLs, Needs assistance with gait, and Needs assistance with transfers  PATIENT GOALS: "to get back to my main function - climbing steps"  NEXT MD VISIT: unknown  OBJECTIVE:  Note: Objective measures were completed at Evaluation unless otherwise noted.  DIAGNOSTIC FINDINGS:  new thoracic lesions found in PET scan   PATIENT SURVEYS:  Neuro Quality of Life to be determined  COGNITION: Overall cognitive status: Within functional limits for tasks assessed     SENSATION: Light  touch: impaired on B with R > L  MUSCLE LENGTH: Hamstrings: mild to moderate restriction  POSTURE: rounded shoulders and forward head  LOWER EXTREMITY ROM:    MMT Right eval Left eval  Hip flexion Around 20%  WFL  Hip extension    Hip abduction    Hip adduction    Hip internal rotation    Hip external rotation    Knee flexion [facility] WFL  Knee extension Around 80% WFL  Ankle dorsiflexion Around 50% WFL  Ankle plantarflexion [facility] WFL  Ankle inversion    Ankle eversion     (Blank rows = not tested)  LOWER EXTREMITY MMT:     Active  Right eval Left eval  Hip flexion 2+ 4-  Hip extension    Hip abduction 3+ 4-  Hip adduction    Hip internal rotation    Hip external rotation    Knee flexion 3+ 4-  Knee extension 2+ 4-  Ankle dorsiflexion 2+ 4-  Ankle plantarflexion 3+ 4-  Ankle inversion    Ankle eversion     (  Blank rows = not tested)  FUNCTIONAL TESTS:  2 minute walk test: 64 ft with CGA and rolling walker 30 sec chair stand test: 6x with CGA and UE assist on armrest/rolling walker.  TRANSFERS  Bed to chair (and back): CGA to SBA with side scooting  Sit-to-stand: CGA with rolling walker GAIT: Distance walked: 64 ft Assistive device utilized: Walker - 2 wheeled Level of assistance: CGA and with w/c follow Comments: done during , decreased swing on R, R foot slap, decreased stance time R  TREATMENT DATE:  228/25 NuStep, level 1, seat 13, 5' Cybex hamstring curls, 5 plates x 10 x 2 Mini squats x 3" x 10 x 2 Heel raises x 10 x 2 Forward step ups x 2" box 10 x 2  02/03/24 Seated:  LAQ 10x5"  Sit to stands 5X no UE Logroll technique/education Supine:  Bridge 10X  SLR 10X each with core stab/breathing Standing in walker hip abduction 10X each Ambulation with RWX 100 feet Stair negotiation with 1 HR and SPC step to pattern 7" height X 2 RT  01/20/24 Evaluation and patient education done                                                                                                                                  PATIENT EDUCATION:  Education details: Educated on the goals and course of rehab.  Person educated: Patient and Spouse Education method: Explanation Education comprehension: verbalized understanding  HOME EXERCISE PROGRAM: Access Code: [email or web address] URL: [email or web address] 01/15/2024 - Mini Squat with Counter Support  - 2 x daily - 7 x weekly - 2 sets - 10 reps - 3 hold - Heel Raises with Counter Support  - 2 x daily - 7 x weekly - 2 sets - 10 reps  Date: 02/03/2024 Prepared by: Emeline Gins Exercises - Beginner Bridge  - 2 x daily - 7 x weekly - 10 reps - 3 sec hold - Small Range Straight Leg Raise  - 2 x daily - 7 x weekly - 10 reps - Sitting to Supine Roll  - 7 x weekly - Seated Long Arc Quad  - 2 x daily - 7 x weekly - 10 reps - 5 sec hold - Sit to Stand Without Arm Support  - 2 x daily - 7 x weekly - 2 sets - 5 reps - Standing Hip Abduction  - 2 x daily - 7 x weekly - 10 reps  ASSESSMENT:  CLINICAL IMPRESSION: Interventions today were geared towards LE strengthening. Tolerated all activities without worsening of symptoms. Demonstrated mild levels of fatigue. Rest periods given. Provided mild amount of cueing to ensure correct execution of activity with fair to good carry-over due to weakness. Mild to moderate unsteadiness noted on step ups due to impaired proprioception. BP = 118/54 with HR = 103 at the end of the session. To date, skilled PT is required to address the impairments and improve function.  Patient is a 48 y.o. male who was seen today for physical therapy evaluation and treatment for s/p spinal surgery due to metastatic cancer to the spine. Patient's condition is further defined by difficulty with transfers, ambulation and stair negotiation due to weakness, and impaired balance and proprioception. Skilled PT is required to address the impairments and functional limitations listed below.     OBJECTIVE IMPAIRMENTS: Abnormal gait, decreased activity tolerance, decreased balance, decreased mobility, difficulty walking, decreased strength, impaired flexibility, and impaired tone.   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   REHAB POTENTIAL: Fair    CLINICAL DECISION MAKING: Evolving/moderate complexity  EVALUATION COMPLEXITY: Moderate   GOALS: Goals reviewed with patient? Yes  SHORT TERM GOALS: Target date: 03/02/24  Pt will demonstrate indep in HEP to facilitate carry-over of skilled services and improve functional outcomes Goal status: IN PROGRESS  2.  Pt will be able to perform sit-to-stand with modified independence (use of rolling walker) Baseline: see above Goal status: IN PROGRESS  3.  Pt will be able to ambulate for > 200 ft with modified independence and use of rolling walker Baseline: see above Goal status: IN PROGRESS  LONG TERM GOALS: Target date: 04/13/24  Pt will be able to stand up >5x in the 30 sec chair-to-stand test without assistance to demonstrate clinically significant improvement in balance and LE strength.    Baseline: see above Goal status: IN PROGRESS  2.  Pt will increase by at least 120 ft in order to demonstrate clinically significant improvement in community ambulation Baseline: 64 ft Goal status: IN PROGRESS  3.  Pt will demonstrate increase in LE strength to 3+/5 to facilitate ease and safety in ambulation  Baseline: 2+/5 Goal status: INITIAL  4.  Pt will be able to negotiate stairs (4-5 steps) with  alternating feet and use of railings with CGA-SBA Baseline: to be determined Goal status: INITIAL  5.  Pt will be able to ambulate with LRAD > 200 ft Baseline: see above Goal status:IN PROGRESS  PLAN:  PT FREQUENCY:  3x/week for the 1st month, re-assess needs on the 4th week  PT DURATION: 12 weeks  PLANNED INTERVENTIONS: 97164- PT Re-evaluation, 97110-Therapeutic exercises, 97530- Therapeutic activity, [id number]- Neuromuscular re-education, 97535- Self Care, [id number]- Manual therapy, [phone]- Gait training, [phone]- Orthotic Fit/training, Patient/Family education, and Stair training.  PLAN FOR NEXT SESSION: Progress LE strengthening, balance, and gait activities.  Gait with LRAD  Iantha Fallen L. Raynisha Avilla, PT, DPT, OCS Board-Certified Clinical Specialist in Orthopedic PT PT Compact Privilege # ([place]): [id number] T 02/04/2024, 8:56 AM

## 2024-02-07 ENCOUNTER — Ambulatory Visit (HOSPITAL_COMMUNITY): Payer: BC Managed Care – PPO | Admitting: Physical Therapy

## 2024-02-07 DIAGNOSIS — R262 Difficulty in walking, not elsewhere classified: Secondary | ICD-10-CM | POA: Insufficient documentation

## 2024-02-07 DIAGNOSIS — L89306 Pressure-induced deep tissue damage of unspecified buttock: Secondary | ICD-10-CM | POA: Diagnosis not present

## 2024-02-07 DIAGNOSIS — S31000D Unspecified open wound of lower back and pelvis without penetration into retroperitoneum, subsequent encounter: Secondary | ICD-10-CM | POA: Diagnosis not present

## 2024-02-07 DIAGNOSIS — R29898 Other symptoms and signs involving the musculoskeletal system: Secondary | ICD-10-CM | POA: Diagnosis present

## 2024-02-07 DIAGNOSIS — C7951 Secondary malignant neoplasm of bone: Secondary | ICD-10-CM | POA: Insufficient documentation

## 2024-02-07 NOTE — Therapy (Signed)
 OUTPATIENT PHYSICAL THERAPY Treatment  Patient Name: Scott Eaton MRN: [id number] DOB:1976/03/30, 48 y.o., male Today's Date: 02/07/2024  END OF SESSION:  PT End of Session - 02/07/24 0721     Visit Number 4    Number of Visits 28    Date for PT Re-Evaluation 04/13/24    Authorization Type [employer] PPO (no auth, visit limit 60 combined with OT)    Authorization - Number of Visits 60    PT Start Time [phone]    PT Stop Time 0800    PT Time Calculation (min) 42 min    Equipment Utilized During Treatment Gait belt    Activity Tolerance Patient tolerated treatment well    Behavior During Therapy WFL for tasks assessed/performed              Past Medical History:  Diagnosis Date   Cervical radiculopathy    cervical stenosis   Complication of anesthesia    They had a problem putting pt to sleep   Obesity    Past Surgical History:  Procedure Laterality Date   LAMINECTOMY N/A 10/12/2019   Procedure: Cervical Three-Five Posterior Cervical Fusion with Laminectomy and Resection of Tumor;  Surgeon: Maeola Harman, MD;  Location: [facility] OR;  Service: Neurosurgery;  Laterality: N/A;  Cervical 3-4 Posterior cervical fusion with laminectomy and resection of tumor   TONSILLECTOMY     WISDOM TOOTH EXTRACTION     Patient Active Problem List   Diagnosis Date Noted   Status post cervical spinal fusion 10/12/2019   Elevated BP without diagnosis of hypertension 10/05/2019   COVID-19 virus infection 10/04/2019   Leukocytosis 10/04/2019   Metastatic cancer to spine (HCC) 10/02/2019    PCP: None on file  REFERRING PROVIDER: [name], PA  REFERRING DIAG: C79.51 (ICD-10-CM) - Metastatic cancer to spine [facility])  Rationale for Evaluation and Treatment: Rehabilitation  THERAPY DIAG:  Metastatic cancer to spine [facility])  Other symptoms and signs involving the musculoskeletal system  Weakness of both lower extremities  Difficulty in walking, not elsewhere classified  ONSET DATE: on  12/16/23: - ARTHRODESIS, POSTERIOR OR POSTEROLATERAL TECHNIQUE, SINGLE interspace; THORACIC (WITH LATERAL TRANSVERSE TECHNIQUE, WHEN PERFORMED) POSTERIOR SEGMENTAL INSTRUMENTATION (EG, PEDICLE FIXATION, DUAL RODS WITH MULTIPLE HOOKS AND SUBLAMINAR WIRES); 7 TO 12 VERTEBRAL SEGMENTS (LIST IN ADDITION TO PRIMARY PROCEDURE)   - LAMINECTOMY FOR BIOPSY/EXCISION OF INTRASPINAL NEOPLASM; EXTRADURAL, THORACIC VERTEBRAL CORPECTOMY (VERTEBRAL BODY RESECTION), PARTIAL OR COMPLETE, FOR EXCISION OF INTRASPINAL LESION, SINGLE SEGMENT; EXTRADURAL, THORACIC BY THORACOLUMBAR APPROACH  - PERCU VERTEBROPLASTY, 1 VERTEBRAL BODY, UNI OR BILATERAL INJECTION, INCL ALL IMAGING GUIDANCE; EACH ADD CERVICOTHORACIC OR LUMBOSACRAL VERTEBRAL BODY (LIST IN ADDITION TO CODE FOR PRIMARY PROCEDURE) TRANSPEDICULAR APPROACH WITH DECOMPRESSION OF SPINAL CORD, EQUINA AND/OR NERVE ROOT(S) (EG, HERNIATED INTERVERTEBRAL DISC), SINGLE SEGMENT; THORACIC ALLOGRAFT, MORSELIZED, OR PLACEMENT OF OSTEOPROMOTIVE MATERIAL, FOR SPINE SURGERY ONLY (LIST IN ADDITION TO PRIMARY PROCEDURE)  - INSERTION INTERBODY BIOMECHANICAL DEVICE WITH INTEGRAL ANTERIOR INSTRUMENTATION, TO INTERVERTEBRAL DISC SPACE IN CONJUNCTION INTERBODY ARTHRODESIS, EACH INTERSPACE (LIST CODE FOR PRIMARY PROCEDURE)   SUBJECTIVE:  SUBJECTIVE STATEMENT: 02/07/24:  pt reports no issues, trying to do more at home.  No pain or soreness today.  EVAL: Arrives to the clinic with c/o weakness on the legs (R>L). Also reports that the legs are tingly. Patient had surgery for his midback on 12/16/23 (see above) due to metastatic cancer to the spine (patient has rods and screws). Stayed in the hospital until 12/30/23. Went to the rehab hospital in [place] 12/30/23 till 01/17/24 where patient had PT/OT. Patient is now able  to do bed mobility (supine to sit) with some assistance on the leg every once in a while but patient can be independent most of the time. Patient is also now able to stand up on his own with rolling walker and some assistance from the wife.Patient can do bed to chair transfers by scooting. Patient also states that he is able to walk for around 70 ft with rolling walker and some stand by assist from caregivers. On his last day at the rehab hospital, patient was able to do stairs (4 steps x 4 rounds) in the rehab hospital with CGA. Patient is currently sleeping on the first floor of the house in a recliner because he cannot do his 13-steps stairs at home. Patient is now referred to outpatient PT evaluation and management.  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 No  PRECAUTIONS: Other: spinal  RED FLAGS: None   WEIGHT BEARING RESTRICTIONS: No  FALLS:  Has patient fallen in last 6 months? No  LIVING ENVIRONMENT: Lives with: lives with their family Lives in: House/apartment Stairs: Yes: Internal: 13 steps; can reach both and External: 1 steps; none Has following equipment at home: Single point cane, Walker - 2 wheeled, Wheelchair (manual), shower chair, and Shower bench  OCCUPATION: on disability  PLOF: Needs assistance with ADLs, Needs assistance with gait, and Needs assistance with transfers  PATIENT GOALS: "to get back to my main function - climbing steps"  NEXT MD VISIT: unknown  OBJECTIVE:  Note: Objective measures were completed at Evaluation unless otherwise noted.  DIAGNOSTIC FINDINGS:  new thoracic lesions found in PET scan   PATIENT SURVEYS:  Neuro Quality of Life to be determined  COGNITION: Overall cognitive status: Within functional limits for tasks assessed     SENSATION: Light touch: impaired on B with R >  L  MUSCLE LENGTH: Hamstrings: mild to moderate restriction  POSTURE: rounded shoulders and forward head  LOWER EXTREMITY ROM:    MMT Right eval Left eval  Hip flexion Around 20%  WFL  Hip extension    Hip abduction    Hip adduction    Hip internal rotation    Hip external rotation    Knee flexion [facility] WFL  Knee extension Around 80% WFL  Ankle dorsiflexion Around 50% WFL  Ankle plantarflexion [facility] WFL  Ankle inversion    Ankle eversion     (Blank rows = not tested)  LOWER EXTREMITY MMT:     Active  Right eval Left eval  Hip flexion 2+ 4-  Hip extension    Hip abduction 3+ 4-  Hip adduction    Hip internal rotation    Hip external rotation    Knee flexion 3+ 4-  Knee extension 2+ 4-  Ankle dorsiflexion 2+ 4-  Ankle plantarflexion 3+ 4-  Ankle inversion  Ankle eversion     (Blank rows = not tested)  FUNCTIONAL TESTS:  2 minute walk test: 64 ft with CGA and rolling walker 30 sec chair stand test: 6x with CGA and UE assist on armrest/rolling walker.  TRANSFERS  Bed to chair (and back): CGA to SBA with side scooting  Sit-to-stand: CGA with rolling walker GAIT: Distance walked: 64 ft Assistive device utilized: Walker - 2 wheeled Level of assistance: CGA and with w/c follow Comments: done during , decreased swing on R, R foot slap, decreased stance time R  TREATMENT DATE:  02/07/24 NuStep, level 3, seat 10, 5' Cybex hamstring curls, 5 plates x 10 x 2 Heel raises 20X Toe raises 20X Mini squats x 3" x 10 x 2 Heel raises x 10 x 2 Forward step ups x 4" box 10 x 2 Hip abduction 2X10 Hip extension 2X10 Ambulation with RW   02/04/24 NuStep, level 1, seat 13, 5' Cybex hamstring curls, 5 plates x 10 x 2 Mini squats x 3" x 10 x 2 Heel raises x 10 x 2 Forward step ups x 2" box 10 x 2  02/03/24 Seated:  LAQ 10x5"  Sit to stands 5X no UE Logroll technique/education Supine:  Bridge 10X  SLR 10X each with core stab/breathing Standing in walker hip  abduction 10X each Ambulation with RWX 100 feet Stair negotiation with 1 HR and SPC step to pattern 7" height X 2 RT  01/20/24 Evaluation and patient education done                                                                                                                                 PATIENT EDUCATION:  Education details: Educated on the goals and course of rehab.  Person educated: Patient and Spouse Education method: Explanation Education comprehension: verbalized understanding  HOME EXERCISE PROGRAM: Access Code: [email or web address] URL: [email or web address] 01/15/2024 - Mini Squat with Counter Support  - 2 x daily - 7 x weekly - 2 sets - 10 reps - 3 hold - Heel Raises with Counter Support  - 2 x daily - 7 x weekly - 2 sets - 10 reps  Date: 02/03/2024 Prepared by: Emeline Gins Exercises - Beginner Bridge  - 2 x daily - 7 x weekly - 10 reps - 3 sec hold - Small Range Straight Leg Raise  - 2 x daily - 7 x weekly - 10 reps - Sitting to Supine Roll  - 7 x weekly - Seated Long Arc Quad  - 2 x daily - 7 x weekly - 10 reps - 5 sec hold - Sit to Stand Without Arm Support  - 2 x daily - 7 x weekly - 2 sets - 5 reps - Standing Hip Abduction  - 2 x daily - 7 x weekly - 10 reps  ASSESSMENT:  CLINICAL IMPRESSION: Continued with focus on improving functional strength and stability.  Able to increase a level of resistance  on nustep and increase to 4" step with forward step activity.  Step ups most tiring and challenging for patient, having to use intermittent UE for Lt leading and bil UE for Rt LE leading.  Two seated rest breaks required during session today.  skilled PT is required to address the impairments and improve function.  Patient is a 48 y.o. male who was seen today for physical therapy evaluation and treatment for s/p spinal surgery due to metastatic cancer to the spine. Patient's condition is further defined by difficulty with transfers, ambulation and stair negotiation  due to weakness, and impaired balance and proprioception. Skilled PT is required to address the impairments and functional limitations listed below.    OBJECTIVE IMPAIRMENTS: Abnormal gait, decreased activity tolerance, decreased balance, decreased mobility, difficulty walking, decreased strength, impaired flexibility, and impaired tone.   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   REHAB POTENTIAL: Fair    CLINICAL DECISION MAKING: Evolving/moderate complexity  EVALUATION COMPLEXITY: Moderate   GOALS: Goals reviewed with patient? Yes  SHORT TERM GOALS: Target date: 03/02/24  Pt will demonstrate indep in HEP to facilitate carry-over of skilled services and improve functional outcomes Goal status: IN PROGRESS  2.  Pt will be able to perform sit-to-stand with modified independence (use of rolling walker) Baseline: see above Goal status: IN PROGRESS  3.  Pt will be able to ambulate for > 200 ft with modified independence and use of rolling walker Baseline: see above Goal status: IN PROGRESS  LONG TERM GOALS: Target date: 04/13/24  Pt will be able to stand up >5x in the 30 sec chair-to-stand test without assistance to demonstrate clinically significant improvement in balance and LE strength.    Baseline: see above Goal status: IN PROGRESS  2.  Pt will increase by at least 120 ft in order to demonstrate clinically significant improvement in community ambulation Baseline: 64 ft Goal status: IN PROGRESS  3.  Pt will demonstrate increase in LE strength to  3+/5 to facilitate ease and safety in ambulation  Baseline: 2+/5 Goal status: INITIAL  4.  Pt will be able to negotiate stairs (4-5 steps) with alternating feet and use of railings with CGA-SBA Baseline: to be determined Goal status: INITIAL  5.  Pt will be able to ambulate with LRAD > 200 ft Baseline: see above Goal status:IN PROGRESS  PLAN:  PT FREQUENCY:  3x/week for the 1st month, re-assess needs on the 4th week  PT DURATION: 12 weeks  PLANNED INTERVENTIONS: 97164- PT Re-evaluation, 97110-Therapeutic exercises, 97530- Therapeutic activity, [id number]- Neuromuscular re-education, 97535- Self Care, 95621- Manual therapy, [phone]- Gait training, [phone]- Orthotic Fit/training, Patient/Family education, and Stair training.  PLAN FOR NEXT SESSION: Progress LE strengthening, balance, and gait activities.  Gait with LRAD  Lurena Nida, PTA/CLT [facility] Ph: [phone]  02/07/2024, 7:53 AM

## 2024-02-08 ENCOUNTER — Other Ambulatory Visit: Payer: Self-pay

## 2024-02-08 ENCOUNTER — Ambulatory Visit (HOSPITAL_COMMUNITY): Admitting: Physical Therapy

## 2024-02-08 ENCOUNTER — Encounter (HOSPITAL_COMMUNITY): Payer: Self-pay | Admitting: Physical Therapy

## 2024-02-08 DIAGNOSIS — S31000D Unspecified open wound of lower back and pelvis without penetration into retroperitoneum, subsequent encounter: Secondary | ICD-10-CM

## 2024-02-08 DIAGNOSIS — C7951 Secondary malignant neoplasm of bone: Secondary | ICD-10-CM | POA: Diagnosis not present

## 2024-02-08 NOTE — Therapy (Addendum)
 OUTPATIENT PHYSICAL THERAPY wound EVALUATION   Patient Name: Scott Eaton MRN: [id number] DOB:12/06/1976, 48 y.o., male Today's Date: 02/08/2024   PCP: none REFERRING PROVIDER: Isaias Cowman, MD  END OF SESSION:  PT End of Session - 02/08/24 0848     Visit Number 1    Number of Visits 6    Date for PT Re-Evaluation 03/21/24    Authorization Type [employer]BS Comm PPO (no auth, visit limit 60 combined with OT)    PT Start Time 0803    PT Stop Time 0833    PT Time Calculation (min) 30 min    Activity Tolerance Patient tolerated treatment well    Behavior During Therapy [facility] for tasks assessed/performed             Past Medical History:  Diagnosis Date   Cervical radiculopathy    cervical stenosis   Complication of anesthesia    They had a problem putting pt to sleep   Obesity    Past Surgical History:  Procedure Laterality Date   LAMINECTOMY N/A 10/12/2019   Procedure: Cervical Three-Five Posterior Cervical Fusion with Laminectomy and Resection of Tumor;  Surgeon: Maeola Harman, MD;  Location: [facility] OR;  Service: Neurosurgery;  Laterality: N/A;  Cervical 3-4 Posterior cervical fusion with laminectomy and resection of tumor   TONSILLECTOMY     WISDOM TOOTH EXTRACTION     Patient Active Problem List   Diagnosis Date Noted   Status post cervical spinal fusion 10/12/2019   Elevated BP without diagnosis of hypertension 10/05/2019   COVID-19 virus infection 10/04/2019   Leukocytosis 10/04/2019   Metastatic cancer to spine (HCC) 10/02/2019    ONSET DATE: January 2025  REFERRING DIAG:  Diagnosis  L89.306 (ICD-10-CM) - Pressure-induced deep tissue damage of unspecified buttock    THERAPY DIAG:  Sacral wound, subsequent encounter Patient had surgery for his midback on 12/16/23 (see above) due to metastatic cancer to the spine (patient has rods and screws). Stayed in the hospital until 12/30/23. Went to the rehab hospital in [place] 12/30/23 till 01/17/24 where patient had PT/OT.    Rationale for Evaluation and Treatment: Rehabilitation     Wound Therapy - 02/08/24 0001     Subjective PT states that he was in the hospital for metastatic bone cancer and was immobile for several weeks.  While he was in the hospital he acquired a sacral wound.  Wife has been changing the dressing daily.  PT has been trying to stay off of it to relieve the pressure but this is difficult due to the location.    Patient and Family Stated Goals wound to heal    Prior Treatments care at rehab and self care at home    Pain Scale 0-10    Pain Score 3     Pain Location Sacrum    Pain Descriptors / Indicators Burning    Pain Onset Other (Comment)   with pressure   Patients Stated Pain Goal 0    Evaluation and Treatment Procedures Explained to Patient/Family Yes    Evaluation and Treatment Procedures agreed to    Wound Properties Date First Assessed: 02/08/24 Time First Assessed: 0810 Wound Type: Other (Comment) , pressure  Location: Sacrum Location Orientation: Mid Wound Description (Comments): sacral Present on Admission: Yes   Wound Image Images linked: 1    Dressing Type Foam - Lift dressing to assess site every shift    Dressing Changed Changed    Dressing Change Frequency PRN    Site /  Wound Assessment Clean;Yellow    % Wound base Red or Granulating 0%    % Wound base Yellow/Fibrinous Exudate 100%    Peri-wound Assessment Intact    Wound Length (cm) 1.2 cm    Wound Width (cm) 1.5 cm    Wound Depth (cm) 0.3 cm    Wound Volume (cm^3) 0.54 cm^3    Wound Surface Area (cm^2) 1.8 cm^2    Margins Epibole (rolled edges)    Drainage Amount None    Treatment Cleansed;Debridement (Selective)    Selective Debridement (non-excisional) - Location wound bed and edges    Selective Debridement (non-excisional) - Tools Used Scalpel    Selective Debridement (non-excisional) - Tissue Removed slough    Wound Therapy - Clinical Statement see below    Wound Therapy - Functional Problem List  difficult/painful to sit    Factors Delaying/Impairing Wound Healing Immobility;Multiple medical problems;Polypharmacy    Hydrotherapy Plan Debridement;Dressing change;Patient/family education    Wound Therapy - Frequency Other (comment)   1 x a week for debridement; wife is taking care of wound   Wound Plan PT for debridement and to ensure a wound healing environment is maintained.    Dressing  medihoney, 2x2, tegaderm, cut out foam around wound with medipore tape to secure.               PATIENT EDUCATION: Education details: keep pressure off, increase water and protein intake, decrease frequency of changing dressing unless it is soiled.  Person educated: Spouse Education method: Explanation and Verbal cues Education comprehension: verbalized understanding   HOME EXERCISE PROGRAM: N/A at this time.  PT receiving therapy for strengthening and has HEP for this.    GOALS: Goals reviewed with patient? No  SHORT TERM GOALS: Target date: 02/29/24  PT pain to be no greater than a 1 Baseline: Goal status: INITIAL  2.  Wound to be 100% granulated  Baseline:  Goal status: INITIAL  LONG TERM GOALS: Target date: 03/21/24  Pt to have no pain  Baseline:  Goal status: INITIAL  2.  PT wound to be healed  Baseline:  Goal status: INITIAL   ASSESSMENT:  CLINICAL IMPRESSION: Patient is a 48 y.o. male who was seen today for physical therapy evaluation and treatment for non healing pressure ulcer of the sacrum.   Evaluation demonstrates a wound without granulation and with depth that is non healing.  The pt wife has been self treating since they were discharged from the hospital.  [name] will benefit from skilled PT for wound care to debride the wound bed as well as the epibole edges and create a healing environment.  Once 100% granulation has been obtained the wound should heal quickly.     OBJECTIVE IMPAIRMENTS: difficulty walking, pain, and decreased skin integrity  .    ACTIVITY LIMITATIONS: sitting, bathing, toileting, and dressing  PERSONAL FACTORS: Fitness and 1-2 comorbidities: cancer, immobility polypharmacy  are also affecting patient's functional outcome.   REHAB POTENTIAL: Good  CLINICAL DECISION MAKING: Evolving/moderate complexity  EVALUATION COMPLEXITY: Moderate  PLAN: PT FREQUENCY: 1x/week  PT DURATION: 6 weeks  PLANNED INTERVENTIONS: 97535- Self Care and 70623- Wound care (first 20 sq cm)  PLAN FOR NEXT SESSION: debride, assess wound bed and change dressing as indicated.   Virgina Organ, PT CLT [phone]  02/08/2024, 8:49 AM

## 2024-02-09 ENCOUNTER — Encounter (HOSPITAL_COMMUNITY): Payer: BC Managed Care – PPO

## 2024-02-11 ENCOUNTER — Ambulatory Visit (HOSPITAL_COMMUNITY)

## 2024-02-11 ENCOUNTER — Ambulatory Visit (HOSPITAL_COMMUNITY): Payer: BC Managed Care – PPO

## 2024-02-11 DIAGNOSIS — R29898 Other symptoms and signs involving the musculoskeletal system: Secondary | ICD-10-CM

## 2024-02-11 DIAGNOSIS — C7951 Secondary malignant neoplasm of bone: Secondary | ICD-10-CM

## 2024-02-11 DIAGNOSIS — R262 Difficulty in walking, not elsewhere classified: Secondary | ICD-10-CM

## 2024-02-11 NOTE — Therapy (Signed)
 OUTPATIENT PHYSICAL THERAPY Treatment  Patient Name: Scott Eaton MRN: [id number] DOB:24-Jun-1976, 48 y.o., male Today's Date: 02/11/2024  END OF SESSION:  PT End of Session - 02/11/24 0809     Visit Number 5    Number of Visits 28    Date for PT Re-Evaluation 05/03/24    Authorization Type [employer] P[employer] (no auth, visit limit 60 combined with OT)    Authorization - Number of Visits 60    PT Start Time 0805    PT Stop Time 0845    PT Time Calculation (min) 40 min    Equipment Utilized During Treatment Gait belt    Activity Tolerance Patient tolerated treatment well    Behavior During Therapy WFL for tasks assessed/performed            Past Medical History:  Diagnosis Date   Cervical radiculopathy    cervical stenosis   Complication of anesthesia    They had a problem putting pt to sleep   Obesity    Past Surgical History:  Procedure Laterality Date   LAMINECTOMY N/A 10/12/2019   Procedure: Cervical Three-Five Posterior Cervical Fusion with Laminectomy and Resection of Tumor;  Surgeon: Maeola Harman, MD;  Location: [facility] OR;  Service: Neurosurgery;  Laterality: N/A;  Cervical 3-4 Posterior cervical fusion with laminectomy and resection of tumor   TONSILLECTOMY     WISDOM TOOTH EXTRACTION     Patient Active Problem List   Diagnosis Date Noted   Status post cervical spinal fusion 10/12/2019   Elevated BP without diagnosis of hypertension 10/05/2019   COVID-19 virus infection 10/04/2019   Leukocytosis 10/04/2019   Metastatic cancer to spine (HCC) 10/02/2019    PCP: None on file  REFERRING PROVIDER: [name], PA  REFERRING DIAG: C79.51 (ICD-10-CM) - Metastatic cancer to spine [facility])  Rationale for Evaluation and Treatment: Rehabilitation  THERAPY DIAG:  Weakness of both lower extremities  Difficulty in walking, not elsewhere classified  Other symptoms and signs involving the musculoskeletal system  Metastatic cancer to spine (HCC)  ONSET DATE: on  12/16/23: - ARTHRODESIS, POSTERIOR OR POSTEROLATERAL TECHNIQUE, SINGLE interspace; THORACIC (WITH LATERAL TRANSVERSE TECHNIQUE, WHEN PERFORMED) POSTERIOR SEGMENTAL INSTRUMENTATION (EG, PEDICLE FIXATION, DUAL RODS WITH MULTIPLE HOOKS AND SUBLAMINAR WIRES); 7 TO 12 VERTEBRAL SEGMENTS (LIST IN ADDITION TO PRIMARY PROCEDURE)   - LAMINECTOMY FOR BIOPSY/EXCISION OF INTRASPINAL NEOPLASM; EXTRADURAL, THORACIC VERTEBRAL CORPECTOMY (VERTEBRAL BODY RESECTION), PARTIAL OR COMPLETE, FOR EXCISION OF INTRASPINAL LESION, SINGLE SEGMENT; EXTRADURAL, THORACIC BY THORACOLUMBAR APPROACH  - PERCU VERTEBROPLASTY, 1 VERTEBRAL BODY, UNI OR BILATERAL INJECTION, INCL ALL IMAGING GUIDANCE; EACH ADD CERVICOTHORACIC OR LUMBOSACRAL VERTEBRAL BODY (LIST IN ADDITION TO CODE FOR PRIMARY PROCEDURE) TRANSPEDICULAR APPROACH WITH DECOMPRESSION OF SPINAL CORD, EQUINA AND/OR NERVE ROOT(S) (EG, HERNIATED INTERVERTEBRAL DISC), SINGLE SEGMENT; THORACIC ALLOGRAFT, MORSELIZED, OR PLACEMENT OF OSTEOPROMOTIVE MATERIAL, FOR SPINE SURGERY ONLY (LIST IN ADDITION TO PRIMARY PROCEDURE)  - INSERTION INTERBODY BIOMECHANICAL DEVICE WITH INTEGRAL ANTERIOR INSTRUMENTATION, TO INTERVERTEBRAL DISC SPACE IN CONJUNCTION INTERBODY ARTHRODESIS, EACH INTERSPACE (LIST CODE FOR PRIMARY PROCEDURE)   SUBJECTIVE:  SUBJECTIVE STATEMENT: 02/11/24:  Doing well today. Denies any pain. States that he's tired from yesterday's radiation therapy.  EVAL: Arrives to the clinic with c/o weakness on the legs (R>L). Also reports that the legs are tingly. Patient had surgery for his midback on 12/16/23 (see above) due to metastatic cancer to the spine (patient has rods and screws). Stayed in the hospital until 12/30/23. Went to the rehab hospital in [place] 12/30/23 till 01/17/24 where patient had PT/OT.  Patient is now able to do bed mobility (supine to sit) with some assistance on the leg every once in a while but patient can be independent most of the time. Patient is also now able to stand up on his own with rolling walker and some assistance from the wife.Patient can do bed to chair transfers by scooting. Patient also states that he is able to walk for around 70 ft with rolling walker and some stand by assist from caregivers. On his last day at the rehab hospital, patient was able to do stairs (4 steps x 4 rounds) in the rehab hospital with CGA. Patient is currently sleeping on the first floor of the house in a recliner because he cannot do his 13-steps stairs at home. Patient is now referred to outpatient PT evaluation and management.  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 No  PRECAUTIONS: Other: spinal  RED FLAGS: None   WEIGHT BEARING RESTRICTIONS: No  FALLS:  Has patient fallen in last 6 months? No  LIVING ENVIRONMENT: Lives with: lives with their family Lives in: House/apartment Stairs: Yes: Internal: 13 steps; can reach both and External: 1 steps; none Has following equipment at home: Single point cane, Walker - 2 wheeled, Wheelchair (manual), shower chair, and Shower bench  OCCUPATION: on disability  PLOF: Needs assistance with ADLs, Needs assistance with gait, and Needs assistance with transfers  PATIENT GOALS: "to get back to my main function - climbing steps"  NEXT MD VISIT: unknown  OBJECTIVE:  Note: Objective measures were completed at Evaluation unless otherwise noted.  DIAGNOSTIC FINDINGS:  new thoracic lesions found in PET scan   PATIENT SURVEYS:  Neuro Quality of Life to be determined  COGNITION: Overall cognitive status: Within functional limits for tasks assessed     SENSATION: Light touch:  impaired on B with R > L  MUSCLE LENGTH: Hamstrings: mild to moderate restriction  POSTURE: rounded shoulders and forward head  LOWER EXTREMITY ROM:    MMT Right eval Left eval  Hip flexion Around 20%  WFL  Hip extension    Hip abduction    Hip adduction    Hip internal rotation    Hip external rotation    Knee flexion [facility] WFL  Knee extension Around 80% WFL  Ankle dorsiflexion Around 50% WFL  Ankle plantarflexion [facility] WFL  Ankle inversion    Ankle eversion     (Blank rows = not tested)  LOWER EXTREMITY MMT:     Active  Right eval Left eval  Hip flexion 2+ 4-  Hip extension    Hip abduction 3+ 4-  Hip adduction    Hip internal rotation    Hip external rotation    Knee flexion 3+ 4-  Knee extension 2+ 4-  Ankle dorsiflexion 2+ 4-  Ankle plantarflexion 3+ 4-  Ankle inversion    Ankle eversion     (  Blank rows = not tested)  FUNCTIONAL TESTS:  2 minute walk test: 64 ft with CGA and rolling walker 30 sec chair stand test: 6x with CGA and UE assist on armrest/rolling walker.  TRANSFERS  Bed to chair (and back): CGA to SBA with side scooting  Sit-to-stand: CGA with rolling walker GAIT: Distance walked: 64 ft Assistive device utilized: Walker - 2 wheeled Level of assistance: CGA and with w/c follow Comments: done during , decreased swing on R, R foot slap, decreased stance time R  TREATMENT DATE:  02/11/24 NuStep, level 3, seat 13, 5' Cybex hamstring curls, 6 plates x 10 x 2 Standing TKE, RTB x 3" x 10 x 2 Hip vectors, YTB x 10 x 2 Seated calf stretch x 30" x 2 Standing heel/toe raises x 10 x 2 Ambulation with L UE inside // bars with CGA x 2 rounds Ambulation with large base quad cane on L inside // bars with CGA x 2 rounds  02/07/24 NuStep, level 3, seat 10, 5' Cybex hamstring curls, 5 plates x 10 x 2 Heel raises 20X Toe raises 20X Mini squats x 3" x 10 x 2 Heel raises x 10 x 2 Forward step ups x 4" box 10 x 2 Hip abduction 2X10 Hip extension  2X10 Ambulation with RW   02/04/24 NuStep, level 1, seat 13, 5' Cybex hamstring curls, 5 plates x 10 x 2 Mini squats x 3" x 10 x 2 Heel raises x 10 x 2 Forward step ups x 2" box 10 x 2  02/03/24 Seated:  LAQ 10x5"  Sit to stands 5X no UE Logroll technique/education Supine:  Bridge 10X  SLR 10X each with core stab/breathing Standing in walker hip abduction 10X each Ambulation with RWX 100 feet Stair negotiation with 1 HR and SPC step to pattern 7" height X 2 RT  01/20/24 Evaluation and patient education done                                                                                                                                 PATIENT EDUCATION:  Education details: Educated on the goals and course of rehab.  Person educated: Patient and Spouse Education method: Explanation Education comprehension: verbalized understanding  HOME EXERCISE PROGRAM: Access Code: [email or web address] URL: [email or web address] 02/11/2024 - Standing 3-Way Leg Reach with Resistance at Ankles and Counter Support  - 1 x daily - 7 x weekly - 2 sets - 10 reps - Seated Calf Stretch with Strap  - 2 x daily - 7 x weekly - 3 reps - 30 hold - Heel Toe Raises with Counter Support  - 2 x daily - 7 x weekly - 2 sets - 10 reps  Date: 02/03/2024 Prepared by: Emeline Gins Exercises - Beginner Bridge  - 2 x daily - 7 x weekly - 10 reps - 3 sec hold - Small Range Straight Leg Raise  - 2 x daily - 7 x weekly -  10 reps - Sitting to Supine Roll  - 7 x weekly - Seated Long Arc Quad  - 2 x daily - 7 x weekly - 10 reps - 5 sec hold - Sit to Stand Without Arm Support  - 2 x daily - 7 x weekly - 2 sets - 5 reps - Standing Hip Abduction  - 2 x daily - 7 x weekly - 10 reps  01/15/2024 - Mini Squat with Counter Support  - 2 x daily - 7 x weekly - 2 sets - 10 reps - 3 hold - Heel Raises with Counter Support  - 2 x daily - 7 x weekly - 2 sets - 10 reps   ASSESSMENT:  CLINICAL IMPRESSION: Interventions today  were geared towards  LE flexibility, strength and gait training. Tolerated all activities without worsening of symptoms. Demonstrated mild levels of fatigue. Rest periods provided. Provided slight amount of cueing to ensure correct execution of activity with good carry-over. Pacing of activities was slightly slow. Slight to mild unsteadiness noted with ambulating with large base quad cane [facility]). To date, skilled PT is required to address the impairments and improve function.   Patient is a 48 y.o. male who was seen today for physical therapy evaluation and treatment for s/p spinal surgery due to metastatic cancer to the spine. Patient's condition is further defined by difficulty with transfers, ambulation and stair negotiation due to weakness, and impaired balance and proprioception. Skilled PT is required to address the impairments and functional limitations listed below.    OBJECTIVE IMPAIRMENTS: Abnormal gait, decreased activity tolerance, decreased balance, decreased mobility, difficulty walking, decreased strength, impaired flexibility, and impaired tone.   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   REHAB POTENTIAL: Fair    CLINICAL DECISION MAKING: Evolving/moderate complexity  EVALUATION COMPLEXITY: Moderate   GOALS: Goals reviewed with patient? Yes  SHORT TERM GOALS: Target date: 03/02/24  Pt will demonstrate indep in HEP to facilitate carry-over of skilled services and improve functional outcomes Goal status: IN PROGRESS  2.  Pt will be able to perform  sit-to-stand with modified independence (use of rolling walker) Baseline: see above Goal status: IN PROGRESS  3.  Pt will be able to ambulate for > 200 ft with modified independence and use of rolling walker Baseline: see above Goal status: IN PROGRESS  LONG TERM GOALS: Target date: 04/13/24  Pt will be able to stand up >5x in the 30 sec chair-to-stand test without assistance to demonstrate clinically significant improvement in balance and LE strength.    Baseline: see above Goal status: IN PROGRESS  2.  Pt will increase by at least 120 ft in order to demonstrate clinically significant improvement in community ambulation Baseline: 64 ft Goal status: IN PROGRESS  3.  Pt will demonstrate increase in LE strength to 3+/5 to facilitate ease and safety in ambulation  Baseline: 2+/5 Goal status: INITIAL  4.  Pt will be able to negotiate stairs (4-5 steps) with alternating feet and use of railings with CGA-SBA Baseline: to be determined Goal status: INITIAL  5.  Pt will be able to ambulate with LRAD > 200 ft Baseline: see above  Goal status:IN PROGRESS  PLAN:  PT FREQUENCY:  3x/week for the 1st month, re-assess needs on the 4th week  PT DURATION: 12 weeks  PLANNED INTERVENTIONS: [id number]- PT Re-evaluation, 97110-Therapeutic exercises, 97530- Therapeutic activity, [id number]- Neuromuscular re-education, 97535- Self Care, [id number]- Manual therapy, [phone]- Gait training, [phone]- Orthotic Fit/training, Patient/Family education, and Stair training.  PLAN FOR NEXT SESSION: Progress LE strengthening, balance, and gait activities.  Gait with LRAD  Iantha Fallen L. Laderius Valbuena, PT, DPT, OCS Board-Certified Clinical Specialist in Orthopedic PT PT Compact Privilege # ([place]): [id number] T 02/11/2024, 8:50 AM

## 2024-02-14 ENCOUNTER — Ambulatory Visit (HOSPITAL_COMMUNITY): Payer: BC Managed Care – PPO | Admitting: Physical Therapy

## 2024-02-14 DIAGNOSIS — C7951 Secondary malignant neoplasm of bone: Secondary | ICD-10-CM | POA: Diagnosis not present

## 2024-02-14 DIAGNOSIS — R29898 Other symptoms and signs involving the musculoskeletal system: Secondary | ICD-10-CM

## 2024-02-14 DIAGNOSIS — R262 Difficulty in walking, not elsewhere classified: Secondary | ICD-10-CM

## 2024-02-14 NOTE — Therapy (Signed)
 OUTPATIENT PHYSICAL THERAPY Treatment  Patient Name: Scott Eaton MRN: [id number] DOB:[birth year], 48 y.o., male Today's Date: 02/14/2024  END OF SESSION:  PT End of Session - 02/14/24 1016     Visit Number 6    Number of Visits 28    Date for PT Re-Evaluation 05/03/24    Authorization Type [employer] P[employer] (no auth, visit limit 60 combined with OT)    Authorization - Number of Visits 60    PT Start Time 0803    PT Stop Time 0851    PT Time Calculation (min) 48 min    Equipment Utilized During Treatment Gait belt    Activity Tolerance Patient tolerated treatment well    Behavior During Therapy WFL for tasks assessed/performed             Past Medical History:  Diagnosis Date   Cervical radiculopathy    cervical stenosis   Complication of anesthesia    They had a problem putting pt to sleep   Obesity    Past Surgical History:  Procedure Laterality Date   LAMINECTOMY N/A 10/12/2019   Procedure: Cervical Three-Five Posterior Cervical Fusion with Laminectomy and Resection of Tumor;  Surgeon: Maeola Harman, MD;  Location: [facility] OR;  Service: Neurosurgery;  Laterality: N/A;  Cervical 3-4 Posterior cervical fusion with laminectomy and resection of tumor   TONSILLECTOMY     WISDOM TOOTH EXTRACTION     Patient Active Problem List   Diagnosis Date Noted   Status post cervical spinal fusion 10/12/2019   Elevated BP without diagnosis of hypertension 10/05/2019   COVID-19 virus infection 10/04/2019   Leukocytosis 10/04/2019   Metastatic cancer to spine (HCC) 10/02/2019    PCP: None on file  REFERRING PROVIDER: [name], PA  REFERRING DIAG: C79.51 (ICD-10-CM) - Metastatic cancer to spine [facility])  Rationale for Evaluation and Treatment: Rehabilitation  THERAPY DIAG:  Weakness of both lower extremities  Other symptoms and signs involving the musculoskeletal system  Difficulty in walking, not elsewhere classified  ONSET DATE: on 12/16/23: - ARTHRODESIS, POSTERIOR OR  POSTEROLATERAL TECHNIQUE, SINGLE interspace; THORACIC (WITH LATERAL TRANSVERSE TECHNIQUE, WHEN PERFORMED) POSTERIOR SEGMENTAL INSTRUMENTATION (EG, PEDICLE FIXATION, DUAL RODS WITH MULTIPLE HOOKS AND SUBLAMINAR WIRES); 7 TO 12 VERTEBRAL SEGMENTS (LIST IN ADDITION TO PRIMARY PROCEDURE)   - LAMINECTOMY FOR BIOPSY/EXCISION OF INTRASPINAL NEOPLASM; EXTRADURAL, THORACIC VERTEBRAL CORPECTOMY (VERTEBRAL BODY RESECTION), PARTIAL OR COMPLETE, FOR EXCISION OF INTRASPINAL LESION, SINGLE SEGMENT; EXTRADURAL, THORACIC BY THORACOLUMBAR APPROACH  - PERCU VERTEBROPLASTY, 1 VERTEBRAL BODY, UNI OR BILATERAL INJECTION, INCL ALL IMAGING GUIDANCE; EACH ADD CERVICOTHORACIC OR LUMBOSACRAL VERTEBRAL BODY (LIST IN ADDITION TO CODE FOR PRIMARY PROCEDURE) TRANSPEDICULAR APPROACH WITH DECOMPRESSION OF SPINAL CORD, EQUINA AND/OR NERVE ROOT(S) (EG, HERNIATED INTERVERTEBRAL DISC), SINGLE SEGMENT; THORACIC ALLOGRAFT, MORSELIZED, OR PLACEMENT OF OSTEOPROMOTIVE MATERIAL, FOR SPINE SURGERY ONLY (LIST IN ADDITION TO PRIMARY PROCEDURE)  - INSERTION INTERBODY BIOMECHANICAL DEVICE WITH INTEGRAL ANTERIOR INSTRUMENTATION, TO INTERVERTEBRAL DISC SPACE IN CONJUNCTION INTERBODY ARTHRODESIS, EACH INTERSPACE (LIST CODE FOR PRIMARY PROCEDURE)   SUBJECTIVE:  SUBJECTIVE STATEMENT: 02/14/24:  Doing well today.  Having radiation every other day.  PT and woundcare also.  Doing well overall.  EVAL: Arrives to the clinic with c/o weakness on the legs (R>L). Also reports that the legs are tingly. Patient had surgery for his midback on 12/16/23 (see above) due to metastatic cancer to the spine (patient has rods and screws). Stayed in the hospital until 12/30/23. Went to the rehab hospital in [place] 12/30/23 till 01/17/24 where patient had PT/OT. Patient is now able to do bed  mobility (supine to sit) with some assistance on the leg every once in a while but patient can be independent most of the time. Patient is also now able to stand up on his own with rolling walker and some assistance from the wife.Patient can do bed to chair transfers by scooting. Patient also states that he is able to walk for around 70 ft with rolling walker and some stand by assist from caregivers. On his last day at the rehab hospital, patient was able to do stairs (4 steps x 4 rounds) in the rehab hospital with CGA. Patient is currently sleeping on the first floor of the house in a recliner because he cannot do his 13-steps stairs at home. Patient is now referred to outpatient PT evaluation and management.  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 No  PRECAUTIONS: Other: spinal  RED FLAGS: None   WEIGHT BEARING RESTRICTIONS: No  FALLS:  Has patient fallen in last 6 months? No  LIVING ENVIRONMENT: Lives with: lives with their family Lives in: House/apartment Stairs: Yes: Internal: 13 steps; can reach both and External: 1 steps; none Has following equipment at home: Single point cane, Walker - 2 wheeled, Wheelchair (manual), shower chair, and Shower bench  OCCUPATION: on disability  PLOF: Needs assistance with ADLs, Needs assistance with gait, and Needs assistance with transfers  PATIENT GOALS: "to get back to my main function - climbing steps"  NEXT MD VISIT: unknown  OBJECTIVE:  Note: Objective measures were completed at Evaluation unless otherwise noted.  DIAGNOSTIC FINDINGS:  new thoracic lesions found in PET scan   PATIENT SURVEYS:  Neuro Quality of Life to be determined  COGNITION: Overall cognitive status: Within functional limits for tasks assessed     SENSATION: Light touch: impaired on B with R > L  MUSCLE  LENGTH: Hamstrings: mild to moderate restriction  POSTURE: rounded shoulders and forward head  LOWER EXTREMITY ROM:    MMT Right eval Left eval  Hip flexion Around 20%  WFL  Hip extension    Hip abduction    Hip adduction    Hip internal rotation    Hip external rotation    Knee flexion [facility] WFL  Knee extension Around 80% WFL  Ankle dorsiflexion Around 50% WFL  Ankle plantarflexion [facility] WFL  Ankle inversion    Ankle eversion     (Blank rows = not tested)  LOWER EXTREMITY MMT:     Active  Right eval Left eval  Hip flexion 2+ 4-  Hip extension    Hip abduction 3+ 4-  Hip adduction    Hip internal rotation    Hip external rotation    Knee flexion 3+ 4-  Knee extension 2+ 4-  Ankle dorsiflexion 2+ 4-  Ankle plantarflexion 3+ 4-  Ankle inversion  Ankle eversion     (Blank rows = not tested)  FUNCTIONAL TESTS:  2 minute walk test: 64 ft with CGA and rolling walker 30 sec chair stand test: 6x with CGA and UE assist on armrest/rolling walker.  TRANSFERS  Bed to chair (and back): CGA to SBA with side scooting  Sit-to-stand: CGA with rolling walker GAIT: Distance walked: 64 ft Assistive device utilized: Walker - 2 wheeled Level of assistance: CGA and with w/c follow Comments: done during , decreased swing on R, R foot slap, decreased stance time R  TREATMENT DATE:  02/14/24 NuStep, level 3, seat 12, 10'  UE/LE Standing:  in // bars with bil UE assist  Abduction with toe forward 10X  Vectors 5X5" each Standing heel/toe raises x 20 Slant board stretch 3X30"  Ambulation with WBQC outside // bars with CGA x 40 feet Cybex hamstring curls, 6 plates x 10 x 2 Body craft TKE  3Pl 10 x 2 each LE with walker for stability  02/11/24 NuStep, level 3, seat 13, 5' Cybex hamstring curls, 6 plates x 10 x 2 Standing TKE, RTB x 3" x 10 x 2 Hip vectors, YTB x 10 x 2 Seated calf stretch x 30" x 2 Standing heel/toe raises x 10 x 2 Ambulation with L UE inside // bars with  CGA x 2 rounds Ambulation with large base quad cane on L inside // bars with CGA x 2 rounds  02/07/24 NuStep, level 3, seat 10, 5' Cybex hamstring curls, 5 plates x 10 x 2 Heel raises 20X Toe raises 20X Mini squats x 3" x 10 x 2 Heel raises x 10 x 2 Forward step ups x 4" box 10 x 2 Hip abduction 2X10 Hip extension 2X10 Ambulation with RW   02/04/24 NuStep, level 1, seat 13, 5' Cybex hamstring curls, 5 plates x 10 x 2 Mini squats x 3" x 10 x 2 Heel raises x 10 x 2 Forward step ups x 2" box 10 x 2  02/03/24 Seated:  LAQ 10x5"  Sit to stands 5X no UE Logroll technique/education Supine:  Bridge 10X  SLR 10X each with core stab/breathing Standing in walker hip abduction 10X each Ambulation with RWX 100 feet Stair negotiation with 1 HR and SPC step to pattern 7" height X 2 RT  01/20/24 Evaluation and patient education done                                                                                                                                 PATIENT EDUCATION:  Education details: Educated on the goals and course of rehab.  Person educated: Patient and Spouse Education method: Explanation Education comprehension: verbalized understanding  HOME EXERCISE PROGRAM: Access Code: [email or web address] URL: [email or web address] 02/11/2024 - Standing 3-Way Leg Reach with Resistance at Ankles and Counter Support  - 1 x daily - 7 x weekly - 2 sets - 10 reps - Seated Calf Stretch with Strap  -  2 x daily - 7 x weekly - 3 reps - 30 hold - Heel Toe Raises with Counter Support  - 2 x daily - 7 x weekly - 2 sets - 10 reps  Date: 02/03/2024 Prepared by: Emeline Gins Exercises - Beginner Bridge  - 2 x daily - 7 x weekly - 10 reps - 3 sec hold - Small Range Straight Leg Raise  - 2 x daily - 7 x weekly - 10 reps - Sitting to Supine Roll  - 7 x weekly - Seated Long Arc Quad  - 2 x daily - 7 x weekly - 10 reps - 5 sec hold - Sit to Stand Without Arm Support  - 2 x daily - 7 x weekly -  2 sets - 5 reps - Standing Hip Abduction  - 2 x daily - 7 x weekly - 10 reps  01/15/2024 - Mini Squat with Counter Support  - 2 x daily - 7 x weekly - 2 sets - 10 reps - 3 hold - Heel Raises with Counter Support  - 2 x daily - 7 x weekly - 2 sets - 10 reps   ASSESSMENT:  CLINICAL IMPRESSION: Continued focus on improving LE strength, stability and activity tolerance. Used [facility] throughout session today with good stability and control with CGA.  Began TKE using the bodycraft pulley weights to provide measurable resistance.  Pt able to complete using RW for stability.  Overall, tolerated all activities without worsening of symptoms. Pt did require several seated rest breaks today due to mild levels of fatigue. Provided slight amount of cueing to ensure correct form but able to keep count independently. PT will continue to benefit from skilled PT to address the impairments and improve function.   Patient is a 48 y.o. male who was seen today for physical therapy evaluation and treatment for s/p spinal surgery due to metastatic cancer to the spine. Patient's condition is further defined by difficulty with transfers, ambulation and stair negotiation due to weakness, and impaired balance and proprioception. Skilled PT is required to address the impairments and functional limitations listed below.    OBJECTIVE IMPAIRMENTS: Abnormal gait, decreased activity tolerance, decreased balance, decreased mobility, difficulty walking, decreased strength, impaired flexibility, and impaired tone.   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   REHAB POTENTIAL: Fair    CLINICAL DECISION MAKING: Evolving/moderate complexity  EVALUATION COMPLEXITY: Moderate   GOALS: Goals reviewed with patient? Yes  SHORT TERM GOALS: Target date: 03/02/24  Pt will demonstrate indep in HEP to facilitate carry-over of skilled services and improve functional outcomes Goal status: IN PROGRESS  2.  Pt will be able to perform sit-to-stand with modified independence (use of rolling walker) Baseline: see above Goal status: IN PROGRESS  3.  Pt will be able to ambulate for > 200 ft with modified independence and use of rolling walker Baseline: see above Goal status: IN PROGRESS  LONG TERM GOALS: Target date: 04/13/24  Pt will be able to stand up >5x in the 30 sec chair-to-stand test without assistance to demonstrate clinically significant improvement in balance and LE strength.    Baseline: see above Goal status:  IN PROGRESS  2.  Pt will increase by at least 120 ft in order to demonstrate clinically significant improvement in community ambulation Baseline: 64 ft Goal status: IN PROGRESS  3.  Pt will demonstrate increase in LE strength to 3+/5 to facilitate ease and safety in ambulation  Baseline: 2+/5 Goal status: INITIAL  4.  Pt will be able to negotiate stairs (4-5 steps) with alternating feet and use of railings with CGA-SBA Baseline: to be determined Goal status: INITIAL  5.  Pt will be able to ambulate with LRAD > 200 ft Baseline: see above Goal status:IN PROGRESS  PLAN:  PT FREQUENCY:  3x/week for the 1st month, re-assess needs on the 4th week  PT DURATION: 12 weeks  PLANNED INTERVENTIONS: 97164- PT Re-evaluation, 97110-Therapeutic exercises, 97530- Therapeutic activity, [id number]- Neuromuscular re-education, 97535- Self Care, 16109- Manual therapy, [phone]- Gait training, [phone]- Orthotic Fit/training, Patient/Family education, and Stair training.  PLAN FOR NEXT SESSION:  Progress LE strengthening, balance, and gait activities.  Gait with LRAD  Lurena Nida, PTA/CLT [facility] Ph: [phone]  02/14/2024, 10:16 AM

## 2024-02-16 ENCOUNTER — Ambulatory Visit (HOSPITAL_COMMUNITY): Payer: BC Managed Care – PPO | Admitting: Physical Therapy

## 2024-02-16 ENCOUNTER — Ambulatory Visit (HOSPITAL_COMMUNITY): Admitting: Physical Therapy

## 2024-02-16 DIAGNOSIS — R262 Difficulty in walking, not elsewhere classified: Secondary | ICD-10-CM

## 2024-02-16 DIAGNOSIS — R29898 Other symptoms and signs involving the musculoskeletal system: Secondary | ICD-10-CM

## 2024-02-16 DIAGNOSIS — C7951 Secondary malignant neoplasm of bone: Secondary | ICD-10-CM | POA: Diagnosis not present

## 2024-02-16 DIAGNOSIS — S31000D Unspecified open wound of lower back and pelvis without penetration into retroperitoneum, subsequent encounter: Secondary | ICD-10-CM

## 2024-02-16 NOTE — Therapy (Signed)
 OUTPATIENT PHYSICAL THERAPY Treatment  Patient Name: Scott Eaton MRN: [id number] DOB:[birth year], 48 y.o., male Today's Date: 02/16/2024  END OF SESSION:  PT End of Session - 02/16/24 0932     Visit Number 7    Number of Visits 28    Date for PT Re-Evaluation 05/03/24    Authorization Type [employer] PPO (no auth, visit limit 60 combined with OT)    Authorization - Number of Visits 60    PT Start Time 0805    PT Stop Time 0900    PT Time Calculation (min) 55 min    Equipment Utilized During Treatment Gait belt    Activity Tolerance Patient tolerated treatment well    Behavior During Therapy WFL for tasks assessed/performed              Past Medical History:  Diagnosis Date   Cervical radiculopathy    cervical stenosis   Complication of anesthesia    They had a problem putting pt to sleep   Obesity    Past Surgical History:  Procedure Laterality Date   LAMINECTOMY N/A 10/12/2019   Procedure: Cervical Three-Five Posterior Cervical Fusion with Laminectomy and Resection of Tumor;  Surgeon: Maeola Harman, MD;  Location: [facility] OR;  Service: Neurosurgery;  Laterality: N/A;  Cervical 3-4 Posterior cervical fusion with laminectomy and resection of tumor   TONSILLECTOMY     WISDOM TOOTH EXTRACTION     Patient Active Problem List   Diagnosis Date Noted   Status post cervical spinal fusion 10/12/2019   Elevated BP without diagnosis of hypertension 10/05/2019   COVID-19 virus infection 10/04/2019   Leukocytosis 10/04/2019   Metastatic cancer to spine (HCC) 10/02/2019    PCP: None on file  REFERRING PROVIDER: [name], PA  REFERRING DIAG: C79.51 (ICD-10-CM) - Metastatic cancer to spine [facility])  Rationale for Evaluation and Treatment: Rehabilitation  THERAPY DIAG:  Weakness of both lower extremities  Difficulty in walking, not elsewhere classified  Other symptoms and signs involving the musculoskeletal system  ONSET DATE: on 12/16/23: - ARTHRODESIS, POSTERIOR OR  POSTEROLATERAL TECHNIQUE, SINGLE interspace; THORACIC (WITH LATERAL TRANSVERSE TECHNIQUE, WHEN PERFORMED) POSTERIOR SEGMENTAL INSTRUMENTATION (EG, PEDICLE FIXATION, DUAL RODS WITH MULTIPLE HOOKS AND SUBLAMINAR WIRES); 7 TO 12 VERTEBRAL SEGMENTS (LIST IN ADDITION TO PRIMARY PROCEDURE)   - LAMINECTOMY FOR BIOPSY/EXCISION OF INTRASPINAL NEOPLASM; EXTRADURAL, THORACIC VERTEBRAL CORPECTOMY (VERTEBRAL BODY RESECTION), PARTIAL OR COMPLETE, FOR EXCISION OF INTRASPINAL LESION, SINGLE SEGMENT; EXTRADURAL, THORACIC BY THORACOLUMBAR APPROACH  - PERCU VERTEBROPLASTY, 1 VERTEBRAL BODY, UNI OR BILATERAL INJECTION, INCL ALL IMAGING GUIDANCE; EACH ADD CERVICOTHORACIC OR LUMBOSACRAL VERTEBRAL BODY (LIST IN ADDITION TO CODE FOR PRIMARY PROCEDURE) TRANSPEDICULAR APPROACH WITH DECOMPRESSION OF SPINAL CORD, EQUINA AND/OR NERVE ROOT(S) (EG, HERNIATED INTERVERTEBRAL DISC), SINGLE SEGMENT; THORACIC ALLOGRAFT, MORSELIZED, OR PLACEMENT OF OSTEOPROMOTIVE MATERIAL, FOR SPINE SURGERY ONLY (LIST IN ADDITION TO PRIMARY PROCEDURE)  - INSERTION INTERBODY BIOMECHANICAL DEVICE WITH INTEGRAL ANTERIOR INSTRUMENTATION, TO INTERVERTEBRAL DISC SPACE IN CONJUNCTION INTERBODY ARTHRODESIS, EACH INTERSPACE (LIST CODE FOR PRIMARY PROCEDURE)   SUBJECTIVE:  SUBJECTIVE STATEMENT: 02/15/24:  Pt walked into treatment today with RW.  Wife states he drove his tractor; using the RW more and staying out the wheelchair.  Radiation appt was moved to Friday today.  EVAL: Arrives to the clinic with c/o weakness on the legs (R>L). Also reports that the legs are tingly. Patient had surgery for his midback on 12/16/23 (see above) due to metastatic cancer to the spine (patient has rods and screws). Stayed in the hospital until 12/30/23. Went to the rehab hospital in [place] 12/30/23 till  01/17/24 where patient had PT/OT. Patient is now able to do bed mobility (supine to sit) with some assistance on the leg every once in a while but patient can be independent most of the time. Patient is also now able to stand up on his own with rolling walker and some assistance from the wife.Patient can do bed to chair transfers by scooting. Patient also states that he is able to walk for around 70 ft with rolling walker and some stand by assist from caregivers. On his last day at the rehab hospital, patient was able to do stairs (4 steps x 4 rounds) in the rehab hospital with CGA. Patient is currently sleeping on the first floor of the house in a recliner because he cannot do his 13-steps stairs at home. Patient is now referred to outpatient PT evaluation and management.  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 No  PRECAUTIONS: Other: spinal  RED FLAGS: None   WEIGHT BEARING RESTRICTIONS: No  FALLS:  Has patient fallen in last 6 months? No  LIVING ENVIRONMENT: Lives with: lives with their family Lives in: House/apartment Stairs: Yes: Internal: 13 steps; can reach both and External: 1 steps; none Has following equipment at home: Single point cane, Walker - 2 wheeled, Wheelchair (manual), shower chair, and Shower bench  OCCUPATION: on disability  PLOF: Needs assistance with ADLs, Needs assistance with gait, and Needs assistance with transfers  PATIENT GOALS: "to get back to my main function - climbing steps"  NEXT MD VISIT: unknown  OBJECTIVE:  Note: Objective measures were completed at Evaluation unless otherwise noted.  DIAGNOSTIC FINDINGS:  new thoracic lesions found in PET scan   PATIENT SURVEYS:  Neuro Quality of Life to be determined  COGNITION: Overall cognitive status: Within functional limits for tasks  assessed     SENSATION: Light touch: impaired on B with R > L  MUSCLE LENGTH: Hamstrings: mild to moderate restriction  POSTURE: rounded shoulders and forward head  LOWER EXTREMITY ROM:    MMT Right eval Left eval  Hip flexion Around 20%  WFL  Hip extension    Hip abduction    Hip adduction    Hip internal rotation    Hip external rotation    Knee flexion [facility] WFL  Knee extension Around 80% WFL  Ankle dorsiflexion Around 50% WFL  Ankle plantarflexion [facility] WFL  Ankle inversion    Ankle eversion     (Blank rows = not tested)  LOWER EXTREMITY MMT:     Active  Right eval Left eval  Hip flexion 2+ 4-  Hip extension    Hip abduction 3+ 4-  Hip adduction    Hip internal rotation    Hip external rotation    Knee flexion 3+ 4-  Knee extension 2+ 4-  Ankle dorsiflexion 2+ 4-  Ankle plantarflexion 3+ 4-  Ankle inversion    Ankle eversion     (Blank rows = not tested)  FUNCTIONAL TESTS:  2 minute walk test: 64 ft with CGA and rolling walker 30 sec chair stand test: 6x with CGA and UE assist on armrest/rolling walker.  TRANSFERS  Bed to chair (and back): CGA to SBA with side scooting  Sit-to-stand: CGA with rolling walker GAIT: Distance walked: 64 ft Assistive device utilized: Walker - 2 wheeled Level of assistance: CGA and with w/c follow Comments: done during , decreased swing on R, R foot slap, decreased stance time R  TREATMENT DATE:  02/16/24 Standing at steps Heelraises 20X Hip abduction toe fwd 2X10 Hip extensions 2X10 Lunges onto 7" step bil UE 2X10 Slant board stretch 2X30" Ambulation with WBQC while in clinic/between machines Cybex hamstring curls, 7 plates x 10 x 2 Body craft TKE  4Pl 10 x 2 each LE with walker for stability Body craft leg press seat all way back 4 PL 2X10 Nustep at EOS level 4, 5 minutes.  Seat 10, UE's 7   02/14/24 NuStep, level 3, seat 12, 10'  UE/LE Standing:  in // bars with bil UE assist  Abduction with toe forward  10X  Vectors 5X5" each Standing heel/toe raises x 20 Slant board stretch 3X30"  Ambulation with WBQC outside // bars with CGA x 40 feet Cybex hamstring curls, 6 plates x 10 x 2 Body craft TKE  3Pl 10 x 2 each LE with walker for stability  02/11/24 NuStep, level 3, seat 13, 5' Cybex hamstring curls, 6 plates x 10 x 2 Standing TKE, RTB x 3" x 10 x 2 Hip vectors, YTB x 10 x 2 Seated calf stretch x 30" x 2 Standing heel/toe raises x 10 x 2 Ambulation with L UE inside // bars with CGA x 2 rounds Ambulation with large base quad cane on L inside // bars with CGA x 2 rounds  02/07/24 NuStep, level 3, seat 10, 5' Cybex hamstring curls, 5 plates x 10 x 2 Heel raises 20X Toe raises 20X Mini squats x 3" x 10 x 2 Heel raises x 10 x 2 Forward step ups x 4" box 10 x 2 Hip abduction 2X10 Hip extension 2X10 Ambulation with RW   02/04/24 NuStep, level 1, seat 13, 5' Cybex hamstring curls, 5 plates x 10 x 2 Mini squats x 3" x 10 x 2 Heel raises x 10 x 2 Forward step ups x 2" box 10 x 2  02/03/24 Seated:  LAQ 10x5"  Sit to stands 5X no UE Logroll technique/education Supine:  Bridge 10X  SLR 10X each with core stab/breathing Standing in walker hip abduction 10X each Ambulation with RWX 100 feet Stair negotiation with 1 HR and SPC step to pattern 7" height X 2 RT  01/20/24 Evaluation and patient education done  PATIENT EDUCATION:  Education details: Educated on the goals and course of rehab.  Person educated: Patient and Spouse Education method: Explanation Education comprehension: verbalized understanding  HOME EXERCISE PROGRAM: Access Code: [email or web address] URL: [email or web address] 02/11/2024 - Standing 3-Way Leg Reach with Resistance at Ankles and Counter Support  - 1 x daily - 7 x weekly - 2 sets - 10 reps - Seated Calf Stretch with Strap  - 2 x  daily - 7 x weekly - 3 reps - 30 hold - Heel Toe Raises with Counter Support  - 2 x daily - 7 x weekly - 2 sets - 10 reps  Date: 02/03/2024 Prepared by: Emeline Gins Exercises - Beginner Bridge  - 2 x daily - 7 x weekly - 10 reps - 3 sec hold - Small Range Straight Leg Raise  - 2 x daily - 7 x weekly - 10 reps - Sitting to Supine Roll  - 7 x weekly - Seated Long Arc Quad  - 2 x daily - 7 x weekly - 10 reps - 5 sec hold - Sit to Stand Without Arm Support  - 2 x daily - 7 x weekly - 2 sets - 5 reps - Standing Hip Abduction  - 2 x daily - 7 x weekly - 10 reps  01/15/2024 - Mini Squat with Counter Support  - 2 x daily - 7 x weekly - 2 sets - 10 reps - 3 hold - Heel Raises with Counter Support  - 2 x daily - 7 x weekly - 2 sets - 10 reps   ASSESSMENT:  CLINICAL IMPRESSION: Began with standing LE strengthening and stretches. Added lunges and hip extension.  Attempted 1 HHA, however still requires use of bil UE.  Pt with rest break approx every 3 activities indicating increased activity tolerance.  Used WBQC to ambulate in clinic between machines.  Added leg press with good control and challenge and increased weight on TKE and hamstring curls.Continued focus on improving LE strength, stability and activity tolerance. Used [facility] throughout session today with good stability and control with CGA.  Finished session on nustep with noted LE fatigue towards end of session.  Overall improving with strength and functional activity tolerance.   Patient is a 48 y.o. male who was seen today for physical therapy evaluation and treatment for s/p spinal surgery due to metastatic cancer to the spine. Patient's condition is further defined by difficulty with transfers, ambulation and stair negotiation due to weakness, and impaired balance and proprioception. Skilled PT is required to address the impairments and functional limitations listed below.    OBJECTIVE IMPAIRMENTS: Abnormal gait, decreased activity  tolerance, decreased balance, decreased mobility, difficulty walking, decreased strength, impaired flexibility, and impaired tone.   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   REHAB POTENTIAL: Fair    CLINICAL DECISION MAKING: Evolving/moderate complexity  EVALUATION COMPLEXITY: Moderate   GOALS: Goals reviewed with patient? Yes  SHORT TERM GOALS: Target date: 03/02/24  Pt will demonstrate indep in HEP to facilitate carry-over of skilled services and improve functional outcomes Goal status: IN PROGRESS  2.  Pt will be able to perform sit-to-stand with modified independence (use  of rolling walker) Baseline: see above Goal status: IN PROGRESS  3.  Pt will be able to ambulate for > 200 ft with modified independence and use of rolling walker Baseline: see above Goal status: IN PROGRESS  LONG TERM GOALS: Target date: 04/13/24  Pt will be able to stand up >5x in the 30 sec chair-to-stand test without assistance to demonstrate clinically significant improvement in balance and LE strength.    Baseline: see above Goal status: IN PROGRESS  2.  Pt will increase by at least 120 ft in order to demonstrate clinically significant improvement in community ambulation Baseline: 64 ft Goal status: IN PROGRESS  3.  Pt will demonstrate increase in LE strength to 3+/5 to facilitate ease and safety in ambulation  Baseline: 2+/5 Goal status: INITIAL  4.  Pt will be able to negotiate stairs (4-5 steps) with alternating feet and use of railings with CGA-SBA Baseline: to  be determined Goal status: INITIAL  5.  Pt will be able to ambulate with LRAD > 200 ft Baseline: see above Goal status:IN PROGRESS  PLAN:  PT FREQUENCY:  3x/week for the 1st month, re-assess needs on the 4th week  PT DURATION: 12 weeks  PLANNED INTERVENTIONS: 97164- PT Re-evaluation, 97110-Therapeutic exercises, 97530- Therapeutic activity, [id number]- Neuromuscular re-education, 97535- Self Care, [id number]- Manual therapy, [phone]- Gait training, [phone]- Orthotic Fit/training, Patient/Family education, and Stair training.  PLAN FOR NEXT SESSION: Progress LE strengthening, balance, and gait activities.  Gait with LRAD  Lurena Nida, PTA/CLT [facility] Ph: [phone]  02/16/2024, 9:34 AM

## 2024-02-16 NOTE — Therapy (Signed)
 OUTPATIENT PHYSICAL THERAPY wound EVALUATION   Patient Name: Scott Eaton MRN: [id number] DOB:1976/11/21, 48 y.o., male Today's Date: 02/16/2024   PCP: none REFERRING PROVIDER: Isaias Cowman, MD  END OF SESSION:  PT End of Session - 02/16/24 0956     Visit Number 2    Number of Visits 6    Date for PT Re-Evaluation 03/21/24    Authorization Type [employer] Comm PPO (no auth, visit limit 60 combined with OT)    Authorization - Visit Number 9   count addended   Authorization - Number of Visits 60    Progress Note Due on Visit 6    PT Start Time 0900    PT Stop Time 0925    PT Time Calculation (min) 25 min    Activity Tolerance Patient tolerated treatment well    Behavior During Therapy WFL for tasks assessed/performed              Past Medical History:  Diagnosis Date   Cervical radiculopathy    cervical stenosis   Complication of anesthesia    They had a problem putting pt to sleep   Obesity    Past Surgical History:  Procedure Laterality Date   LAMINECTOMY N/A 10/12/2019   Procedure: Cervical Three-Five Posterior Cervical Fusion with Laminectomy and Resection of Tumor;  Surgeon: Maeola Harman, MD;  Location: [facility] OR;  Service: Neurosurgery;  Laterality: N/A;  Cervical 3-4 Posterior cervical fusion with laminectomy and resection of tumor   TONSILLECTOMY     WISDOM TOOTH EXTRACTION     Patient Active Problem List   Diagnosis Date Noted   Status post cervical spinal fusion 10/12/2019   Elevated BP without diagnosis of hypertension 10/05/2019   COVID-19 virus infection 10/04/2019   Leukocytosis 10/04/2019   Metastatic cancer to spine (HCC) 10/02/2019    ONSET DATE: January 2025  REFERRING DIAG:  Diagnosis  L89.306 (ICD-10-CM) - Pressure-induced deep tissue damage of unspecified buttock    THERAPY DIAG:  Sacral wound, subsequent encounter Patient had surgery for his midback on 12/16/23 (see above) due to metastatic cancer to the spine (patient has rods and  screws). Stayed in the hospital until 12/30/23. Went to the rehab hospital in [place] 12/30/23 till 01/17/24 where patient had PT/OT.   Rationale for Evaluation and Treatment: Rehabilitation     Wound Therapy - 02/16/24 0001     Subjective Pt states that it is difficult finding tape that will stay on him    Patient and Family Stated Goals wound to heal    Prior Treatments care at rehab and self care at home    Pain Scale 0-10    Pain Score 2     Pain Location Sacrum    Patients Stated Pain Goal 0    Evaluation and Treatment Procedures Explained to Patient/Family Yes    Evaluation and Treatment Procedures agreed to    Wound Properties Date First Assessed: 02/08/24 Time First Assessed: 0810 Wound Type: Other (Comment) , pressure  Location: Sacrum Location Orientation: Mid Wound Description (Comments): sacral Present on Admission: Yes   Dressing Type Foam - Lift dressing to assess site every shift    Dressing Change Frequency PRN    Site / Wound Assessment Clean;Yellow    % Wound base Red or Granulating --   2   % Wound base Yellow/Fibrinous Exudate --   98   Peri-wound Assessment Intact;Erythema (blanchable)    Wound Length (cm) 1.2 cm    Wound Width (cm) 1.3  cm    Wound Depth (cm) --   .3   Wound Surface Area (cm^2) 1.56 cm^2    Margins Epibole (rolled edges)    Drainage Amount None    Treatment Cleansed;Debridement (Selective)    Selective Debridement (non-excisional) - Location wound bed and edges    Selective Debridement (non-excisional) - Tools Used Scalpel;Forceps;Scissors    Selective Debridement (non-excisional) - Tissue Removed slough    Wound Therapy - Clinical Statement see below    Wound Therapy - Functional Problem List difficult/painful to sit    Factors Delaying/Impairing Wound Healing Immobility;Multiple medical problems;Polypharmacy    Hydrotherapy Plan Debridement;Dressing change;Patient/family education    Wound Therapy - Frequency Other (comment)   1 x a week for  debridement; wife is taking care of wound   Wound Plan PT for debridement and to ensure a wound healing environment is maintained.    Dressing  medihoney, 2x2, medipore tape going vertical followed by foam tape horizontal .                PATIENT EDUCATION: Education details: keep pressure off, increase water and protein intake, decrease frequency of changing dressing unless it is soiled.  Person educated: Spouse Education method: Explanation and Verbal cues Education comprehension: verbalized understanding   HOME EXERCISE PROGRAM: N/A at this time.  PT receiving therapy for strengthening and has HEP for this.    GOALS: Goals reviewed with patient? No  SHORT TERM GOALS: Target date: 02/29/24  PT pain to be no greater than a 1 Baseline: Goal status: INITIAL  2.  Wound to be 100% granulated  Baseline:  Goal status: INITIAL  LONG TERM GOALS: Target date: 03/21/24  Pt to have no pain  Baseline:  Goal status: INITIAL  2.  PT wound to be healed  Baseline:  Goal status: INITIAL   ASSESSMENT:  CLINICAL IMPRESSION: Pt wound has slight increased granulation as well as slight approximation.  Pt having difficulty finding a tape that will stay on therefore therapist gave pt a roll of medipore tape to see if this would keep the dressing on the pt.  Mr. Winegar will continue to benefit from skilled PT for wound care to debride the wound bed as well as the epibole edges and create a healing environment.  Once 100% granulation has been obtained the wound should heal quickly.     OBJECTIVE IMPAIRMENTS: difficulty walking, pain, and decreased skin integrity  .   ACTIVITY LIMITATIONS: sitting, bathing, toileting, and dressing  PERSONAL FACTORS: Fitness and 1-2 comorbidities: cancer, immobility polypharmacy  are also affecting patient's functional outcome.   REHAB POTENTIAL: Good  CLINICAL DECISION MAKING: Evolving/moderate complexity  EVALUATION COMPLEXITY:  Moderate  PLAN: PT FREQUENCY: 1x/week  PT DURATION: 6 weeks  PLANNED INTERVENTIONS: 97535- Self Care and 09811- Wound care (first 20 sq cm)  PLAN FOR NEXT SESSION: debride, assess wound bed and change dressing as indicated.   Virgina Organ, PT CLT [phone]  02/16/2024, 10:08 AM

## 2024-02-18 ENCOUNTER — Ambulatory Visit (HOSPITAL_COMMUNITY): Payer: BC Managed Care – PPO

## 2024-02-18 ENCOUNTER — Encounter (HOSPITAL_COMMUNITY): Payer: Self-pay

## 2024-02-18 DIAGNOSIS — R262 Difficulty in walking, not elsewhere classified: Secondary | ICD-10-CM

## 2024-02-18 DIAGNOSIS — C7951 Secondary malignant neoplasm of bone: Secondary | ICD-10-CM | POA: Diagnosis not present

## 2024-02-18 DIAGNOSIS — R29898 Other symptoms and signs involving the musculoskeletal system: Secondary | ICD-10-CM

## 2024-02-18 NOTE — Therapy (Signed)
 OUTPATIENT PHYSICAL THERAPY Treatment  Patient Name: Scott Eaton MRN: [id number] DOB:07-01-1976, 48 y.o., male Today's Date: 02/18/2024  END OF SESSION:  PT End of Session - 02/18/24 0907     Visit Number 8    Number of Visits 28    Date for PT Re-Evaluation 05/03/24    Authorization Type [employer] (no auth, visit limit 60 combined with OT)    Authorization - Number of Visits 60    PT Start Time 0805    PT Stop Time 0851   Nustep at EOS x5 '   PT Time Calculation (min) 46 min    Equipment Utilized During Treatment Gait belt    Activity Tolerance Patient tolerated treatment well;Patient limited by fatigue    Behavior During Therapy WFL for tasks assessed/performed               Past Medical History:  Diagnosis Date   Cervical radiculopathy    cervical stenosis   Complication of anesthesia    They had a problem putting pt to sleep   Obesity    Past Surgical History:  Procedure Laterality Date   LAMINECTOMY N/A 10/12/2019   Procedure: Cervical Three-Five Posterior Cervical Fusion with Laminectomy and Resection of Tumor;  Surgeon: Maeola Harman, MD;  Location: [facility] OR;  Service: Neurosurgery;  Laterality: N/A;  Cervical 3-4 Posterior cervical fusion with laminectomy and resection of tumor   TONSILLECTOMY     WISDOM TOOTH EXTRACTION     Patient Active Problem List   Diagnosis Date Noted   Status post cervical spinal fusion 10/12/2019   Elevated BP without diagnosis of hypertension 10/05/2019   COVID-19 virus infection 10/04/2019   Leukocytosis 10/04/2019   Metastatic cancer to spine (HCC) 10/02/2019    PCP: None on file  REFERRING PROVIDER: [name], PA  REFERRING DIAG: C79.51 (ICD-10-CM) - Metastatic cancer to spine [facility])  Rationale for Evaluation and Treatment: Rehabilitation  THERAPY DIAG:  Difficulty in walking, not elsewhere classified  Weakness of both lower extremities  Other symptoms and signs involving the musculoskeletal system  ONSET  DATE: on 12/16/23: - ARTHRODESIS, POSTERIOR OR POSTEROLATERAL TECHNIQUE, SINGLE interspace; THORACIC (WITH LATERAL TRANSVERSE TECHNIQUE, WHEN PERFORMED) POSTERIOR SEGMENTAL INSTRUMENTATION (EG, PEDICLE FIXATION, DUAL RODS WITH MULTIPLE HOOKS AND SUBLAMINAR WIRES); 7 TO 12 VERTEBRAL SEGMENTS (LIST IN ADDITION TO PRIMARY PROCEDURE)   - LAMINECTOMY FOR BIOPSY/EXCISION OF INTRASPINAL NEOPLASM; EXTRADURAL, THORACIC VERTEBRAL CORPECTOMY (VERTEBRAL BODY RESECTION), PARTIAL OR COMPLETE, FOR EXCISION OF INTRASPINAL LESION, SINGLE SEGMENT; EXTRADURAL, THORACIC BY THORACOLUMBAR APPROACH  - PERCU VERTEBROPLASTY, 1 VERTEBRAL BODY, UNI OR BILATERAL INJECTION, INCL ALL IMAGING GUIDANCE; EACH ADD CERVICOTHORACIC OR LUMBOSACRAL VERTEBRAL BODY (LIST IN ADDITION TO CODE FOR PRIMARY PROCEDURE) TRANSPEDICULAR APPROACH WITH DECOMPRESSION OF SPINAL CORD, EQUINA AND/OR NERVE ROOT(S) (EG, HERNIATED INTERVERTEBRAL DISC), SINGLE SEGMENT; THORACIC ALLOGRAFT, MORSELIZED, OR PLACEMENT OF OSTEOPROMOTIVE MATERIAL, FOR SPINE SURGERY ONLY (LIST IN ADDITION TO PRIMARY PROCEDURE)  - INSERTION INTERBODY BIOMECHANICAL DEVICE WITH INTEGRAL ANTERIOR INSTRUMENTATION, TO INTERVERTEBRAL DISC SPACE IN CONJUNCTION INTERBODY ARTHRODESIS, EACH INTERSPACE (LIST CODE FOR PRIMARY PROCEDURE)   SUBJECTIVE:  SUBJECTIVE STATEMENT: 02/18/24:  Receiving radiation every other day, has 2 more treatment left. Pt feels a little more wobbly this morning.  Arrived with wife ambulating with RW.  Stated he slept wrong last night, increased upper back pain 5/10.  EVAL: Arrives to the clinic with c/o weakness on the legs (R>L). Also reports that the legs are tingly. Patient had surgery for his midback on 12/16/23 (see above) due to metastatic cancer to the spine (patient has rods and  screws). Stayed in the hospital until 12/30/23. Went to the rehab hospital in [place] 12/30/23 till 01/17/24 where patient had PT/OT. Patient is now able to do bed mobility (supine to sit) with some assistance on the leg every once in a while but patient can be independent most of the time. Patient is also now able to stand up on his own with rolling walker and some assistance from the wife.Patient can do bed to chair transfers by scooting. Patient also states that he is able to walk for around 70 ft with rolling walker and some stand by assist from caregivers. On his last day at the rehab hospital, patient was able to do stairs (4 steps x 4 rounds) in the rehab hospital with CGA. Patient is currently sleeping on the first floor of the house in a recliner because he cannot do his 13-steps stairs at home. Patient is now referred to outpatient PT evaluation and management.  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 No  PRECAUTIONS: Other: spinal  RED FLAGS: None   WEIGHT BEARING RESTRICTIONS: No  FALLS:  Has patient fallen in last 6 months? No  LIVING ENVIRONMENT: Lives with: lives with their family Lives in: House/apartment Stairs: Yes: Internal: 13 steps; can reach both and External: 1 steps; none Has following equipment at home: Single point cane, Walker - 2 wheeled, Wheelchair (manual), shower chair, and Shower bench  OCCUPATION: on disability  PLOF: Needs assistance with ADLs, Needs assistance with gait, and Needs assistance with transfers  PATIENT GOALS: "to get back to my main function - climbing steps"  NEXT MD VISIT: unknown  OBJECTIVE:  Note: Objective measures were completed at Evaluation unless otherwise noted.  DIAGNOSTIC FINDINGS:  new thoracic lesions found in PET scan   PATIENT SURVEYS:  Neuro Quality of Life to be  determined  COGNITION: Overall cognitive status: Within functional limits for tasks assessed     SENSATION: Light touch: impaired on B with R > L  MUSCLE LENGTH: Hamstrings: mild to moderate restriction  POSTURE: rounded shoulders and forward head  LOWER EXTREMITY ROM:    MMT Right eval Left eval  Hip flexion Around 20%  WFL  Hip extension    Hip abduction    Hip adduction    Hip internal rotation    Hip external rotation    Knee flexion [facility] WFL  Knee extension Around 80% WFL  Ankle dorsiflexion Around 50% WFL  Ankle plantarflexion [facility] WFL  Ankle inversion    Ankle eversion     (Blank rows = not tested)  LOWER EXTREMITY MMT:     Active  Right eval Left eval  Hip flexion 2+ 4-  Hip extension    Hip abduction 3+ 4-  Hip adduction    Hip internal rotation    Hip external rotation    Knee flexion 3+ 4-  Knee extension 2+ 4-  Ankle dorsiflexion 2+ 4-  Ankle plantarflexion 3+ 4-  Ankle inversion    Ankle eversion     (Blank rows = not tested)  FUNCTIONAL TESTS:  2 minute walk test: 64 ft with CGA and rolling walker 30 sec chair stand test: 6x with CGA and UE assist on armrest/rolling walker.  TRANSFERS  Bed to chair (and back): CGA to SBA with side scooting  Sit-to-stand: CGA with rolling walker GAIT: Distance walked: 64 ft Assistive device utilized: Walker - 2 wheeled Level of assistance: CGA and with w/c follow Comments: done during , decreased swing on R, R foot slap, decreased stance time R  TREATMENT DATE:  02/18/24: Standing at // bars: SBQC used for short distance, RW for Toe tapping 6in step height RW 173ft STS (elevated height) no HHA 10x Sidestep RTB around thighs 3RT inside // bars Body craft leg press seat all way back 4 PL 2X10 Lunges onto 6" step bil UE 2X10 Nustep  [place] trail 5'  02/16/24 Standing at steps Heelraises 20X Hip abduction toe fwd 2X10 Hip extensions 2X10 Lunges onto 7" step bil UE 2X10 Slant board  stretch 2X30" Ambulation with WBQC while in clinic/between machines Cybex hamstring curls, 7 plates x 10 x 2 Body craft TKE  4Pl 10 x 2 each LE with walker for stability Body craft leg press seat all way back 4 PL 2X10 Nustep at EOS level 4, 5 minutes.  Seat 10, UE's 7   02/14/24 NuStep, level 3, seat 12, 10'  UE/LE Standing:  in // bars with bil UE assist  Abduction with toe forward 10X  Vectors 5X5" each Standing heel/toe raises x 20 Slant board stretch 3X30"  Ambulation with WBQC outside // bars with CGA x 40 feet Cybex hamstring curls, 6 plates x 10 x 2 Body craft TKE  3Pl 10 x 2 each LE with walker for stability  02/11/24 NuStep, level 3, seat 13, 5' Cybex hamstring curls, 6 plates x 10 x 2 Standing TKE, RTB x 3" x 10 x 2 Hip vectors, YTB x 10 x 2 Seated calf stretch x 30" x 2 Standing heel/toe raises x 10 x 2 Ambulation with L UE inside // bars with CGA x 2 rounds Ambulation with large base quad cane on L inside // bars with CGA x 2 rounds  02/07/24 NuStep, level 3, seat 10, 5' Cybex hamstring curls, 5 plates x 10 x 2 Heel raises 20X Toe raises 20X Mini squats x 3" x 10 x 2 Heel raises x 10 x 2 Forward step ups x 4" box 10 x 2 Hip abduction 2X10 Hip extension 2X10 Ambulation with RW   02/04/24 NuStep, level 1, seat 13, 5' Cybex hamstring curls, 5 plates x 10 x 2 Mini squats x 3" x 10 x 2 Heel raises x 10 x 2 Forward step ups x 2" box 10 x 2  02/03/24 Seated:  LAQ 10x5"  Sit to stands 5X no UE Logroll technique/education Supine:  Bridge 10X  SLR 10X each with core stab/breathing Standing in walker hip abduction 10X each Ambulation with RWX 100 feet Stair negotiation with 1 HR and SPC step to pattern 7" height X 2 RT  01/20/24 Evaluation and patient education done  PATIENT EDUCATION:  Education details: Educated on the goals and  course of rehab.  Person educated: Patient and Spouse Education method: Explanation Education comprehension: verbalized understanding  HOME EXERCISE PROGRAM: Access Code: [email or web address] URL: [email or web address] 02/11/2024 - Standing 3-Way Leg Reach with Resistance at Ankles and Counter Support  - 1 x daily - 7 x weekly - 2 sets - 10 reps - Seated Calf Stretch with Strap  - 2 x daily - 7 x weekly - 3 reps - 30 hold - Heel Toe Raises with Counter Support  - 2 x daily - 7 x weekly - 2 sets - 10 reps  Date: 02/03/2024 Prepared by: Emeline Gins Exercises - Beginner Bridge  - 2 x daily - 7 x weekly - 10 reps - 3 sec hold - Small Range Straight Leg Raise  - 2 x daily - 7 x weekly - 10 reps - Sitting to Supine Roll  - 7 x weekly - Seated Long Arc Quad  - 2 x daily - 7 x weekly - 10 reps - 5 sec hold - Sit to Stand Without Arm Support  - 2 x daily - 7 x weekly - 2 sets - 5 reps - Standing Hip Abduction  - 2 x daily - 7 x weekly - 10 reps  01/15/2024 - Mini Squat with Counter Support  - 2 x daily - 7 x weekly - 2 sets - 10 reps - 3 hold - Heel Raises with Counter Support  - 2 x daily - 7 x weekly - 2 sets - 10 reps   ASSESSMENT:  CLINICAL IMPRESSION: Session focus with LE strengthening, balance and stability.  Ambulated with [facility] in clinic between machines with good sequence, noted knee buckling due to weakness.  complete today with RW, Rt knee buckled 2x during testing, presents with increased cadence and no LOB episodes with min guard for safety.  Pt tolerated well to session with no reports of increased pain through session.  Was limited by fatigue with occasional seated rest break required.  Patient is a 48 y.o. male who was seen today for physical therapy evaluation and treatment for s/p spinal surgery due to metastatic cancer to the spine. Patient's condition is further defined by difficulty with transfers, ambulation and stair negotiation due to weakness, and impaired  balance and proprioception. Skilled PT is required to address the impairments and functional limitations listed below.    OBJECTIVE IMPAIRMENTS: Abnormal gait, decreased activity tolerance, decreased balance, decreased mobility, difficulty walking, decreased strength, impaired flexibility, and impaired tone.   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   REHAB POTENTIAL: Fair    CLINICAL DECISION MAKING: Evolving/moderate complexity  EVALUATION COMPLEXITY: Moderate   GOALS: Goals reviewed with patient? Yes  SHORT TERM GOALS: Target date: 03/02/24  Pt will demonstrate indep in HEP to facilitate carry-over of skilled services and improve functional outcomes Goal status: IN PROGRESS  2.  Pt will be able to perform sit-to-stand with modified independence (use of rolling walker) Baseline: see above Goal status: IN PROGRESS  3.  Pt will be able to ambulate for > 200 ft with modified independence and use of rolling walker  Baseline: see above Goal status: IN PROGRESS  LONG TERM GOALS: Target date: 04/13/24  Pt will be able to stand up >5x in the 30 sec chair-to-stand test without assistance to demonstrate clinically significant improvement in balance and LE strength.    Baseline: see above Goal status: IN PROGRESS  2.  Pt will increase by at least 120 ft in order to demonstrate clinically significant improvement in community ambulation Baseline: 64 ft Goal status: IN PROGRESS  3.  Pt will demonstrate increase in LE strength to 3+/5 to facilitate ease and  safety in ambulation  Baseline: 2+/5 Goal status: INITIAL  4.  Pt will be able to negotiate stairs (4-5 steps) with alternating feet and use of railings with CGA-SBA Baseline: to be determined Goal status: INITIAL  5.  Pt will be able to ambulate with LRAD > 200 ft Baseline: see above Goal status:IN PROGRESS  PLAN:  PT FREQUENCY:  3x/week for the 1st month, re-assess needs on the 4th week  PT DURATION: 12 weeks  PLANNED INTERVENTIONS: 97164- PT Re-evaluation, 97110-Therapeutic exercises, 97530- Therapeutic activity, [id number]- Neuromuscular re-education, 97535- Self Care, 16109- Manual therapy, [id number]- Gait training, [phone]- Orthotic Fit/training, Patient/Family education, and Stair training.  PLAN FOR NEXT SESSION: Progress LE strengthening, balance, and gait activities.  Gait with LRAD   Becky Sax, LPTA/CLT; [place] [phone]  02/18/2024, 4:20 PM

## 2024-02-21 ENCOUNTER — Ambulatory Visit (HOSPITAL_COMMUNITY): Payer: BC Managed Care – PPO

## 2024-02-21 DIAGNOSIS — C7951 Secondary malignant neoplasm of bone: Secondary | ICD-10-CM | POA: Diagnosis not present

## 2024-02-21 DIAGNOSIS — R29898 Other symptoms and signs involving the musculoskeletal system: Secondary | ICD-10-CM

## 2024-02-21 DIAGNOSIS — S31000D Unspecified open wound of lower back and pelvis without penetration into retroperitoneum, subsequent encounter: Secondary | ICD-10-CM

## 2024-02-21 DIAGNOSIS — R262 Difficulty in walking, not elsewhere classified: Secondary | ICD-10-CM

## 2024-02-21 NOTE — Therapy (Signed)
 OUTPATIENT PHYSICAL THERAPY Treatment  Patient Name: Scott Eaton MRN: [id number] DOB:1976/10/22, 48 y.o., male Today's Date: 02/21/2024  END OF SESSION:  PT End of Session - 02/21/24 0804     Visit Number 9    Number of Visits 28    Date for PT Re-Evaluation 05/03/24    Authorization Type [employer] Comm PPO (no auth, visit limit 60 combined with OT)    Authorization - Visit Number 10    Authorization - Number of Visits 60    PT Start Time 0804    PT Stop Time 0844    PT Time Calculation (min) 40 min    Equipment Utilized During Treatment Gait belt    Activity Tolerance Patient tolerated treatment well;Patient limited by fatigue    Behavior During Therapy WFL for tasks assessed/performed               Past Medical History:  Diagnosis Date   Cervical radiculopathy    cervical stenosis   Complication of anesthesia    They had a problem putting pt to sleep   Obesity    Past Surgical History:  Procedure Laterality Date   LAMINECTOMY N/A 10/12/2019   Procedure: Cervical Three-Five Posterior Cervical Fusion with Laminectomy and Resection of Tumor;  Surgeon: Maeola Harman, MD;  Location: [facility] OR;  Service: Neurosurgery;  Laterality: N/A;  Cervical 3-4 Posterior cervical fusion with laminectomy and resection of tumor   TONSILLECTOMY     WISDOM TOOTH EXTRACTION     Patient Active Problem List   Diagnosis Date Noted   Status post cervical spinal fusion 10/12/2019   Elevated BP without diagnosis of hypertension 10/05/2019   COVID-19 virus infection 10/04/2019   Leukocytosis 10/04/2019   Metastatic cancer to spine (HCC) 10/02/2019    PCP: None on file  REFERRING PROVIDER: [name], PA  REFERRING DIAG: C79.51 (ICD-10-CM) - Metastatic cancer to spine [facility])  Rationale for Evaluation and Treatment: Rehabilitation  THERAPY DIAG:  Difficulty in walking, not elsewhere classified  Weakness of both lower extremities  Other symptoms and signs involving the musculoskeletal  system  Sacral wound, subsequent encounter  Metastatic cancer to spine (HCC)  ONSET DATE: on 12/16/23: - ARTHRODESIS, POSTERIOR OR POSTEROLATERAL TECHNIQUE, SINGLE interspace; THORACIC (WITH LATERAL TRANSVERSE TECHNIQUE, WHEN PERFORMED) POSTERIOR SEGMENTAL INSTRUMENTATION (EG, PEDICLE FIXATION, DUAL RODS WITH MULTIPLE HOOKS AND SUBLAMINAR WIRES); 7 TO 12 VERTEBRAL SEGMENTS (LIST IN ADDITION TO PRIMARY PROCEDURE)   - LAMINECTOMY FOR BIOPSY/EXCISION OF INTRASPINAL NEOPLASM; EXTRADURAL, THORACIC VERTEBRAL CORPECTOMY (VERTEBRAL BODY RESECTION), PARTIAL OR COMPLETE, FOR EXCISION OF INTRASPINAL LESION, SINGLE SEGMENT; EXTRADURAL, THORACIC BY THORACOLUMBAR APPROACH  - PERCU VERTEBROPLASTY, 1 VERTEBRAL BODY, UNI OR BILATERAL INJECTION, INCL ALL IMAGING GUIDANCE; EACH ADD CERVICOTHORACIC OR LUMBOSACRAL VERTEBRAL BODY (LIST IN ADDITION TO CODE FOR PRIMARY PROCEDURE) TRANSPEDICULAR APPROACH WITH DECOMPRESSION OF SPINAL CORD, EQUINA AND/OR NERVE ROOT(S) (EG, HERNIATED INTERVERTEBRAL DISC), SINGLE SEGMENT; THORACIC ALLOGRAFT, MORSELIZED, OR PLACEMENT OF OSTEOPROMOTIVE MATERIAL, FOR SPINE SURGERY ONLY (LIST IN ADDITION TO PRIMARY PROCEDURE)  - INSERTION INTERBODY BIOMECHANICAL DEVICE WITH INTEGRAL ANTERIOR INSTRUMENTATION, TO INTERVERTEBRAL DISC SPACE IN CONJUNCTION INTERBODY ARTHRODESIS, EACH INTERSPACE (LIST CODE FOR PRIMARY PROCEDURE)   SUBJECTIVE:  SUBJECTIVE STATEMENT: Slept good last night; legs feel stiff this morning; right leg "wants to give out".  Arrives with RW today  EVAL: Arrives to the clinic with c/o weakness on the legs (R>L). Also reports that the legs are tingly. Patient had surgery for his midback on 12/16/23 (see above) due to metastatic cancer to the spine (patient has rods and screws). Stayed in the  hospital until 12/30/23. Went to the rehab hospital in [place] 12/30/23 till 01/17/24 where patient had PT/OT. Patient is now able to do bed mobility (supine to sit) with some assistance on the leg every once in a while but patient can be independent most of the time. Patient is also now able to stand up on his own with rolling walker and some assistance from the wife.Patient can do bed to chair transfers by scooting. Patient also states that he is able to walk for around 70 ft with rolling walker and some stand by assist from caregivers. On his last day at the rehab hospital, patient was able to do stairs (4 steps x 4 rounds) in the rehab hospital with CGA. Patient is currently sleeping on the first floor of the house in a recliner because he cannot do his 13-steps stairs at home. Patient is now referred to outpatient PT evaluation and management.  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 No  PRECAUTIONS: Other: spinal  RED FLAGS: None   WEIGHT BEARING RESTRICTIONS: No  FALLS:  Has patient fallen in last 6 months? No  LIVING ENVIRONMENT: Lives with: lives with their family Lives in: House/apartment Stairs: Yes: Internal: 13 steps; can reach both and External: 1 steps; none Has following equipment at home: Single point cane, Walker - 2 wheeled, Wheelchair (manual), shower chair, and Shower bench  OCCUPATION: on disability  PLOF: Needs assistance with ADLs, Needs assistance with gait, and Needs assistance with transfers  PATIENT GOALS: "to get back to my main function - climbing steps"  NEXT MD VISIT: unknown  OBJECTIVE:  Note: Objective measures were completed at Evaluation unless otherwise noted.  DIAGNOSTIC FINDINGS:  new thoracic lesions found in PET scan   PATIENT SURVEYS:  Neuro Quality of Life to be  determined  COGNITION: Overall cognitive status: Within functional limits for tasks assessed     SENSATION: Light touch: impaired on B with R > L  MUSCLE LENGTH: Hamstrings: mild to moderate restriction  POSTURE: rounded shoulders and forward head  LOWER EXTREMITY ROM:    MMT Right eval Left eval  Hip flexion Around 20%  WFL  Hip extension    Hip abduction    Hip adduction    Hip internal rotation    Hip external rotation    Knee flexion [facility] WFL  Knee extension Around 80% WFL  Ankle dorsiflexion Around 50% WFL  Ankle plantarflexion [facility] WFL  Ankle inversion    Ankle eversion     (Blank rows = not tested)  LOWER EXTREMITY MMT:     Active  Right eval Left eval  Hip flexion 2+ 4-  Hip extension    Hip abduction 3+ 4-  Hip adduction    Hip internal rotation    Hip external rotation    Knee flexion 3+ 4-  Knee extension 2+ 4-  Ankle dorsiflexion 2+ 4-  Ankle plantarflexion 3+ 4-  Ankle inversion  Ankle eversion     (Blank rows = not tested)  FUNCTIONAL TESTS:  2 minute walk test: 64 ft with CGA and rolling walker 30 sec chair stand test: 6x with CGA and UE assist on armrest/rolling walker.  TRANSFERS  Bed to chair (and back): CGA to SBA with side scooting  Sit-to-stand: CGA with rolling walker GAIT: Distance walked: 64 ft Assistive device utilized: Walker - 2 wheeled Level of assistance: CGA and with w/c follow Comments: done during , decreased swing on R, R foot slap, decreased stance time R  TREATMENT DATE:  02/21/24 Nustep seat 10 level 4 x 5' dynamic warm up Standing: Heel raises x 10 Heel raises on incline with 1 UE assist x 10 Toe raises on decline 2 x 10 with 1 UE assist 5# kettle bell mini squats 2 x 10 Cone taps 3 cones x 5 each leg with 1 to 2 UE assist as needed Red theraband sidestepping // bars down and back x 5 Red theraband hip extension 2 x 10    02/18/24: Standing at // bars: SBQC used for short distance, RW for Toe  tapping 6in step height RW 129ft STS (elevated height) no HHA 10x Sidestep RTB around thighs 3RT inside // bars Body craft leg press seat all way back 4 PL 2X10 Lunges onto 6" step bil UE 2X10 Nustep  [place] trail 5'  02/16/24 Standing at steps Heelraises 20X Hip abduction toe fwd 2X10 Hip extensions 2X10 Lunges onto 7" step bil UE 2X10 Slant board stretch 2X30" Ambulation with WBQC while in clinic/between machines Cybex hamstring curls, 7 plates x 10 x 2 Body craft TKE  4Pl 10 x 2 each LE with walker for stability Body craft leg press seat all way back 4 PL 2X10 Nustep at EOS level 4, 5 minutes.  Seat 10, UE's 7   02/14/24 NuStep, level 3, seat 12, 10'  UE/LE Standing:  in // bars with bil UE assist  Abduction with toe forward 10X  Vectors 5X5" each Standing heel/toe raises x 20 Slant board stretch 3X30"  Ambulation with WBQC outside // bars with CGA x 40 feet Cybex hamstring curls, 6 plates x 10 x 2 Body craft TKE  3Pl 10 x 2 each LE with walker for stability  02/11/24 NuStep, level 3, seat 13, 5' Cybex hamstring curls, 6 plates x 10 x 2 Standing TKE, RTB x 3" x 10 x 2 Hip vectors, YTB x 10 x 2 Seated calf stretch x 30" x 2 Standing heel/toe raises x 10 x 2 Ambulation with L UE inside // bars with CGA x 2 rounds Ambulation with large base quad cane on L inside // bars with CGA x 2 rounds  02/07/24 NuStep, level 3, seat 10, 5' Cybex hamstring curls, 5 plates x 10 x 2 Heel raises 20X Toe raises 20X Mini squats x 3" x 10 x 2 Heel raises x 10 x 2 Forward step ups x 4" box 10 x 2 Hip abduction 2X10 Hip extension 2X10 Ambulation with RW   02/04/24 NuStep, level 1, seat 13, 5' Cybex hamstring curls, 5 plates x 10 x 2 Mini squats x 3" x 10 x 2 Heel raises x 10 x 2 Forward step ups x 2" box 10 x 2  02/03/24 Seated:  LAQ 10x5"  Sit to stands 5X no UE Logroll technique/education Supine:  Bridge 10X  SLR 10X each with core stab/breathing Standing in walker  hip abduction 10X each Ambulation with YQM  100 feet Stair negotiation with 1 HR and SPC step to pattern 7" height X 2 RT  01/20/24 Evaluation and patient education done                                                                                                                                 PATIENT EDUCATION:  Education details: Educated on the goals and course of rehab.  Person educated: Patient and Spouse Education method: Explanation Education comprehension: verbalized understanding  HOME EXERCISE PROGRAM: Access Code: [email or web address] URL: [email or web address] 02/11/2024 - Standing 3-Way Leg Reach with Resistance at Ankles and Counter Support  - 1 x daily - 7 x weekly - 2 sets - 10 reps - Seated Calf Stretch with Strap  - 2 x daily - 7 x weekly - 3 reps - 30 hold - Heel Toe Raises with Counter Support  - 2 x daily - 7 x weekly - 2 sets - 10 reps  Date: 02/03/2024 Prepared by: Emeline Gins Exercises - Beginner Bridge  - 2 x daily - 7 x weekly - 10 reps - 3 sec hold - Small Range Straight Leg Raise  - 2 x daily - 7 x weekly - 10 reps - Sitting to Supine Roll  - 7 x weekly - Seated Long Arc Quad  - 2 x daily - 7 x weekly - 10 reps - 5 sec hold - Sit to Stand Without Arm Support  - 2 x daily - 7 x weekly - 2 sets - 5 reps - Standing Hip Abduction  - 2 x daily - 7 x weekly - 10 reps  01/15/2024 - Mini Squat with Counter Support  - 2 x daily - 7 x weekly - 2 sets - 10 reps - 3 hold - Heel Raises with Counter Support  - 2 x daily - 7 x weekly - 2 sets - 10 reps   ASSESSMENT:  CLINICAL IMPRESSION: Warm up on Nustep.  Continued focus on lower extremity mobility and strength; gait and balance.  Good challenge with cone taps; unable to completely let go of bilateral UE support with cone taps.  Moderate fatigue throughout treatment and needs several seated rest breaks.  Needs cues to maintain good posturing with hip extension and sidestepping as he tends to use his trunk to  substitute as he fatigues.  Patient will benefit from continued skilled therapy services to address deficits and promote return to optimal function.       Patient is a 48 y.o. male who was seen today for physical therapy evaluation and treatment for s/p spinal surgery due to metastatic cancer to the spine. Patient's condition is further defined by difficulty with transfers, ambulation and stair negotiation due to weakness, and impaired balance and proprioception. Skilled PT is required to address the impairments and functional limitations listed below.    OBJECTIVE IMPAIRMENTS: Abnormal gait, decreased activity tolerance, decreased balance, decreased mobility, difficulty walking, decreased strength, impaired flexibility,  and impaired tone.   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   REHAB POTENTIAL: Fair    CLINICAL DECISION MAKING: Evolving/moderate complexity  EVALUATION COMPLEXITY: Moderate   GOALS: Goals reviewed with patient? Yes  SHORT TERM GOALS: Target date: 03/02/24  Pt will demonstrate indep in HEP to facilitate carry-over of skilled services and improve functional outcomes Goal status: IN PROGRESS  2.  Pt will be able to perform sit-to-stand with modified independence (use of rolling walker) Baseline: see above Goal status: IN PROGRESS  3.  Pt will be able to ambulate for > 200 ft with modified independence and use of rolling walker Baseline: see above Goal status: IN PROGRESS  LONG TERM GOALS: Target date: 04/13/24  Pt will be able to stand up  >5x in the 30 sec chair-to-stand test without assistance to demonstrate clinically significant improvement in balance and LE strength.    Baseline: see above Goal status: IN PROGRESS  2.  Pt will increase by at least 120 ft in order to demonstrate clinically significant improvement in community ambulation Baseline: 64 ft Goal status: IN PROGRESS  3.  Pt will demonstrate increase in LE strength to 3+/5 to facilitate ease and safety in ambulation  Baseline: 2+/5 Goal status: INITIAL  4.  Pt will be able to negotiate stairs (4-5 steps) with alternating feet and use of railings with CGA-SBA Baseline: to be determined Goal status: INITIAL  5.  Pt will be able to ambulate with LRAD > 200 ft Baseline: see above Goal status:IN PROGRESS  PLAN:  PT FREQUENCY:  3x/week for the 1st month, re-assess needs on the 4th week  PT DURATION: 12 weeks  PLANNED INTERVENTIONS: 97164- PT Re-evaluation, 97110-Therapeutic exercises, 97530- Therapeutic activity, [id number]- Neuromuscular re-education, 97535- Self Care, 86578- Manual therapy, [phone]- Gait training, [phone]- Orthotic Fit/training, Patient/Family education, and Stair training.  PLAN FOR NEXT SESSION: Progress LE strengthening, balance, and gait activities.  Gait with LRAD   8:47 AM, 02/21/24 Donis Kotowski Small Kurstin Dimarzo MPT [facility] [place] [phone]

## 2024-02-23 ENCOUNTER — Encounter (HOSPITAL_COMMUNITY): Payer: Self-pay

## 2024-02-23 ENCOUNTER — Ambulatory Visit (HOSPITAL_COMMUNITY): Payer: BC Managed Care – PPO

## 2024-02-23 ENCOUNTER — Ambulatory Visit (HOSPITAL_COMMUNITY): Admitting: Physical Therapy

## 2024-02-23 DIAGNOSIS — R262 Difficulty in walking, not elsewhere classified: Secondary | ICD-10-CM

## 2024-02-23 DIAGNOSIS — R29898 Other symptoms and signs involving the musculoskeletal system: Secondary | ICD-10-CM

## 2024-02-23 DIAGNOSIS — C7951 Secondary malignant neoplasm of bone: Secondary | ICD-10-CM | POA: Diagnosis not present

## 2024-02-23 DIAGNOSIS — S31000D Unspecified open wound of lower back and pelvis without penetration into retroperitoneum, subsequent encounter: Secondary | ICD-10-CM

## 2024-02-23 NOTE — Therapy (Signed)
 OUTPATIENT PHYSICAL THERAPY WOUND TREATMENT   Patient Name: Scott Eaton MRN: [id number] DOB:01/29/1976, 48 y.o., male Today's Date: 02/23/2024   PCP: none REFERRING PROVIDER: Isaias Cowman, MD  END OF SESSION:  PT End of Session - 02/23/24 1032     Visit Number 3    Number of Visits 6    Date for PT Re-Evaluation 03/21/24    Authorization Type [employer] Comm PPO (no auth, visit limit 60 combined with OT)    Authorization - Visit Number 11    Authorization - Number of Visits 60    Progress Note Due on Visit 6    PT Start Time 0855    PT Stop Time 0925    PT Time Calculation (min) 30 min    Activity Tolerance Patient tolerated treatment well    Behavior During Therapy [facility] for tasks assessed/performed              Past Medical History:  Diagnosis Date   Cervical radiculopathy    cervical stenosis   Complication of anesthesia    They had a problem putting pt to sleep   Obesity    Past Surgical History:  Procedure Laterality Date   LAMINECTOMY N/A 10/12/2019   Procedure: Cervical Three-Five Posterior Cervical Fusion with Laminectomy and Resection of Tumor;  Surgeon: Maeola Harman, MD;  Location: [facility] OR;  Service: Neurosurgery;  Laterality: N/A;  Cervical 3-4 Posterior cervical fusion with laminectomy and resection of tumor   TONSILLECTOMY     WISDOM TOOTH EXTRACTION     Patient Active Problem List   Diagnosis Date Noted   Status post cervical spinal fusion 10/12/2019   Elevated BP without diagnosis of hypertension 10/05/2019   COVID-19 virus infection 10/04/2019   Leukocytosis 10/04/2019   Metastatic cancer to spine (HCC) 10/02/2019    ONSET DATE: January 2025  REFERRING DIAG:  Diagnosis  L89.306 (ICD-10-CM) - Pressure-induced deep tissue damage of unspecified buttock    THERAPY DIAG:  Sacral wound, subsequent encounter Patient had surgery for his midback on 12/16/23 (see above) due to metastatic cancer to the spine (patient has rods and screws). Stayed  in the hospital until 12/30/23. Went to the rehab hospital in [place] 12/30/23 till 01/17/24 where patient had PT/OT.   Rationale for Evaluation and Treatment: Rehabilitation     Wound Therapy - 02/23/24 1016     Subjective pt spouse states the foam tape stayed on a couple days.  Pt reports he's not having any pain at his wound currently and can tell it's getting better.    Patient and Family Stated Goals wound to heal    Prior Treatments care at rehab and self care at home    Pain Scale 0-10    Pain Score 0-No pain    Evaluation and Treatment Procedures Explained to Patient/Family Yes    Evaluation and Treatment Procedures agreed to    Wound Properties Date First Assessed: 02/08/24 Time First Assessed: 0810 Wound Type: Other (Comment) , pressure  Location: Sacrum Location Orientation: Mid Wound Description (Comments): sacral Present on Admission: Yes   Wound Image Images linked: 1    Dressing Type Honey    Dressing Changed Changed    Dressing Status Old drainage    Dressing Change Frequency PRN    Site / Wound Assessment Clean;Yellow    % Wound base Red or Granulating 5%    % Wound base Yellow/Fibrinous Exudate 95%    Peri-wound Assessment Intact;Erythema (blanchable)    Wound Length (cm) 1  cm    Wound Width (cm) 1.2 cm    Wound Depth (cm) 0.2 cm    Wound Volume (cm^3) 0.24 cm^3    Wound Surface Area (cm^2) 1.2 cm^2    Margins Epibole (rolled edges)    Drainage Amount Scant    Drainage Description Serous    Treatment Cleansed;Debridement (Selective)    Selective Debridement (non-excisional) - Location wound bed and edges    Selective Debridement (non-excisional) - Tools Used Scalpel;Forceps;Scissors    Selective Debridement (non-excisional) - Tissue Removed slough    Wound Therapy - Clinical Statement see below    Wound Therapy - Functional Problem List difficult/painful to sit    Factors Delaying/Impairing Wound Healing Immobility;Multiple medical problems;Polypharmacy     Hydrotherapy Plan Debridement;Dressing change;Patient/family education    Wound Therapy - Frequency Other (comment)   1 x a week for debridement; wife is taking care of wound   Wound Plan PT for debridement and to ensure a wound healing environment is maintained.    Dressing  medihoney, 2x2, medipore tape going vertical followed by foam tape horizontal .                PATIENT EDUCATION: Education details: keep pressure off, increase water and protein intake, decrease frequency of changing dressing unless it is soiled.  Person educated: Spouse Education method: Explanation and Verbal cues Education comprehension: verbalized understanding   HOME EXERCISE PROGRAM: N/A at this time.  PT receiving therapy for strengthening and has HEP for this.    GOALS: Goals reviewed with patient? No  SHORT TERM GOALS: Target date: 02/29/24  PT pain to be no greater than a 1 Baseline: Goal status: INITIAL  2.  Wound to be 100% granulated  Baseline:  Goal status: INITIAL  LONG TERM GOALS: Target date: 03/21/24  Pt to have no pain  Baseline:  Goal status: INITIAL  2.  PT wound to be healed  Baseline:  Goal status: INITIAL   ASSESSMENT:  CLINICAL IMPRESSION: Wound measured and photographed today with noted decrease in size indicating approximation.  Wound is also not as deep with some granulation beginning.  Eschar remains adherent needing sharps debridement.   White foam tape worked well over [employer] so continued this.  Continued with medihoney dressing on wound bed.  Pt will continue to benefit from skilled PT for wound care to debride the wound bed as well as the epibole edges and create a healing environment.  Once 100% granulation has been obtained the wound should heal quickly.     OBJECTIVE IMPAIRMENTS: difficulty walking, pain, and decreased skin integrity  .   ACTIVITY LIMITATIONS: sitting, bathing, toileting, and dressing  PERSONAL FACTORS: Fitness and 1-2 comorbidities:  cancer, immobility polypharmacy  are also affecting patient's functional outcome.   REHAB POTENTIAL: Good  CLINICAL DECISION MAKING: Evolving/moderate complexity  EVALUATION COMPLEXITY: Moderate  PLAN: PT FREQUENCY: 1x/week  PT DURATION: 6 weeks  PLANNED INTERVENTIONS: 97535- Self Care and 13086- Wound care (first 20 sq cm)  PLAN FOR NEXT SESSION: debride, assess wound bed and change dressing as indicated.   Lurena Nida, PTA/CLT [facility] Ph: [phone]  02/23/2024, 10:35 AM

## 2024-02-23 NOTE — Therapy (Signed)
 OUTPATIENT PHYSICAL THERAPY Treatment  Patient Name: Scott Eaton MRN: [id number] DOB:12-26-1975, 48 y.o., male Today's Date: 02/23/2024  END OF SESSION:  PT End of Session - 02/23/24 0804     Visit Number 10    Number of Visits 28    Date for PT Re-Evaluation 05/03/24    Authorization Type [employer] P[employer] (no auth, visit limit 60 combined with OT)    Authorization - Number of Visits 60    Progress Note Due on Visit 6    PT Start Time 0805    PT Stop Time 0846    PT Time Calculation (min) 41 min    Equipment Utilized During Treatment Gait belt    Activity Tolerance Patient tolerated treatment well;Patient limited by fatigue    Behavior During Therapy WFL for tasks assessed/performed               Past Medical History:  Diagnosis Date   Cervical radiculopathy    cervical stenosis   Complication of anesthesia    They had a problem putting pt to sleep   Obesity    Past Surgical History:  Procedure Laterality Date   LAMINECTOMY N/A 10/12/2019   Procedure: Cervical Three-Five Posterior Cervical Fusion with Laminectomy and Resection of Tumor;  Surgeon: Maeola Harman, MD;  Location: [facility] OR;  Service: Neurosurgery;  Laterality: N/A;  Cervical 3-4 Posterior cervical fusion with laminectomy and resection of tumor   TONSILLECTOMY     WISDOM TOOTH EXTRACTION     Patient Active Problem List   Diagnosis Date Noted   Status post cervical spinal fusion 10/12/2019   Elevated BP without diagnosis of hypertension 10/05/2019   COVID-19 virus infection 10/04/2019   Leukocytosis 10/04/2019   Metastatic cancer to spine (HCC) 10/02/2019    PCP: None on file  REFERRING PROVIDER: [name], PA  REFERRING DIAG: C79.51 (ICD-10-CM) - Metastatic cancer to spine [facility])  Rationale for Evaluation and Treatment: Rehabilitation  THERAPY DIAG:  Difficulty in walking, not elsewhere classified  Weakness of both lower extremities  ONSET DATE: on 12/16/23: - ARTHRODESIS, POSTERIOR OR  POSTEROLATERAL TECHNIQUE, SINGLE interspace; THORACIC (WITH LATERAL TRANSVERSE TECHNIQUE, WHEN PERFORMED) POSTERIOR SEGMENTAL INSTRUMENTATION (EG, PEDICLE FIXATION, DUAL RODS WITH MULTIPLE HOOKS AND SUBLAMINAR WIRES); 7 TO 12 VERTEBRAL SEGMENTS (LIST IN ADDITION TO PRIMARY PROCEDURE)   - LAMINECTOMY FOR BIOPSY/EXCISION OF INTRASPINAL NEOPLASM; EXTRADURAL, THORACIC VERTEBRAL CORPECTOMY (VERTEBRAL BODY RESECTION), PARTIAL OR COMPLETE, FOR EXCISION OF INTRASPINAL LESION, SINGLE SEGMENT; EXTRADURAL, THORACIC BY THORACOLUMBAR APPROACH  - PERCU VERTEBROPLASTY, 1 VERTEBRAL BODY, UNI OR BILATERAL INJECTION, INCL ALL IMAGING GUIDANCE; EACH ADD CERVICOTHORACIC OR LUMBOSACRAL VERTEBRAL BODY (LIST IN ADDITION TO CODE FOR PRIMARY PROCEDURE) TRANSPEDICULAR APPROACH WITH DECOMPRESSION OF SPINAL CORD, EQUINA AND/OR NERVE ROOT(S) (EG, HERNIATED INTERVERTEBRAL DISC), SINGLE SEGMENT; THORACIC ALLOGRAFT, MORSELIZED, OR PLACEMENT OF OSTEOPROMOTIVE MATERIAL, FOR SPINE SURGERY ONLY (LIST IN ADDITION TO PRIMARY PROCEDURE)  - INSERTION INTERBODY BIOMECHANICAL DEVICE WITH INTEGRAL ANTERIOR INSTRUMENTATION, TO INTERVERTEBRAL DISC SPACE IN CONJUNCTION INTERBODY ARTHRODESIS, EACH INTERSPACE (LIST CODE FOR PRIMARY PROCEDURE)   SUBJECTIVE:  SUBJECTIVE STATEMENT: Reports he feels better today than he did yesterday when he had radiation treatment. Arrives with RW today. No report of pain   EVAL: Arrives to the clinic with c/o weakness on the legs (R>L). Also reports that the legs are tingly. Patient had surgery for his midback on 12/16/23 (see above) due to metastatic cancer to the spine (patient has rods and screws). Stayed in the hospital until 12/30/23. Went to the rehab hospital in [place] 12/30/23 till 01/17/24 where patient had PT/OT. Patient is now  able to do bed mobility (supine to sit) with some assistance on the leg every once in a while but patient can be independent most of the time. Patient is also now able to stand up on his own with rolling walker and some assistance from the wife.Patient can do bed to chair transfers by scooting. Patient also states that he is able to walk for around 70 ft with rolling walker and some stand by assist from caregivers. On his last day at the rehab hospital, patient was able to do stairs (4 steps x 4 rounds) in the rehab hospital with CGA. Patient is currently sleeping on the first floor of the house in a recliner because he cannot do his 13-steps stairs at home. Patient is now referred to outpatient PT evaluation and management.  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 No  PRECAUTIONS: Other: spinal  RED FLAGS: None   WEIGHT BEARING RESTRICTIONS: No  FALLS:  Has patient fallen in last 6 months? No  LIVING ENVIRONMENT: Lives with: lives with their family Lives in: House/apartment Stairs: Yes: Internal: 13 steps; can reach both and External: 1 steps; none Has following equipment at home: Single point cane, Walker - 2 wheeled, Wheelchair (manual), shower chair, and Shower bench  OCCUPATION: on disability  PLOF: Needs assistance with ADLs, Needs assistance with gait, and Needs assistance with transfers  PATIENT GOALS: "to get back to my main function - climbing steps"  NEXT MD VISIT: unknown  OBJECTIVE:  Note: Objective measures were completed at Evaluation unless otherwise noted.  DIAGNOSTIC FINDINGS:  new thoracic lesions found in PET scan   PATIENT SURVEYS:  Neuro Quality of Life to be determined  COGNITION: Overall cognitive status: Within functional limits for tasks assessed     SENSATION: Light touch: impaired on B with  R > L  MUSCLE LENGTH: Hamstrings: mild to moderate restriction  POSTURE: rounded shoulders and forward head  LOWER EXTREMITY ROM:    MMT Right eval Left eval  Hip flexion Around 20%  WFL  Hip extension    Hip abduction    Hip adduction    Hip internal rotation    Hip external rotation    Knee flexion [facility] WFL  Knee extension Around 80% WFL  Ankle dorsiflexion Around 50% WFL  Ankle plantarflexion [facility] WFL  Ankle inversion    Ankle eversion     (Blank rows = not tested)  LOWER EXTREMITY MMT:     Active  Right eval Left eval  Hip flexion 2+ 4-  Hip extension    Hip abduction 3+ 4-  Hip adduction    Hip internal rotation    Hip external rotation    Knee flexion 3+ 4-  Knee extension 2+ 4-  Ankle dorsiflexion 2+ 4-  Ankle plantarflexion 3+ 4-  Ankle inversion    Ankle eversion     (Blank rows = not tested)  FUNCTIONAL TESTS:  2 minute walk test: 64 ft with CGA and rolling walker 30 sec chair stand test: 6x with CGA and UE assist on armrest/rolling walker.  TRANSFERS  Bed to chair (and back): CGA to SBA with side scooting  Sit-to-stand: CGA with rolling walker GAIT: Distance walked: 64 ft Assistive device utilized: Walker - 2 wheeled Level of assistance: CGA and with w/c follow Comments: done during , decreased swing on R, R foot slap, decreased stance time R  TREATMENT DATE:  02/23/24 -Nustep seat 10 level 4 x 5' dynamic warm up -Standing forward/backward walk outs w/ RW for support, ~66ft, 5x on plate 2, 5x on plate 3, pt with good control but varying step lengths and apttern -Stepping over orange obstacle in // bars, x10, 2nd set addition of 3 lb ankle weight shows improved consistency and control with step placement, v cues for decreasing UE support t/o -Side stepping in // bar, 3lb ankle weights, 6x down and back, v cues for decreasing UE support t/o  02/21/24 Nustep seat 10 level 4 x 5' dynamic warm up Standing: Heel raises x 10 Heel raises on  incline with 1 UE assist x 10 Toe raises on decline 2 x 10 with 1 UE assist 5# kettle bell mini squats 2 x 10 Cone taps 3 cones x 5 each leg with 1 to 2 UE assist as needed Red theraband sidestepping // bars down and back x 5 Red theraband hip extension 2 x 10   02/18/24: Standing at // bars: SBQC used for short distance, RW for Toe tapping 6in step height RW 135ft STS (elevated height) no HHA 10x Sidestep RTB around thighs 3RT inside // bars Body craft leg press seat all way back 4 PL 2X10 Lunges onto 6" step bil UE 2X10 Nustep  [place] trail 5'  02/16/24 Standing at steps Heelraises 20X Hip abduction toe fwd 2X10 Hip extensions 2X10 Lunges onto 7" step bil UE 2X10 Slant board stretch 2X30" Ambulation with WBQC while in clinic/between machines Cybex hamstring curls, 7 plates x 10 x 2 Body craft TKE  4Pl 10 x 2 each LE with walker for stability Body craft leg press seat all way back 4 PL 2X10 Nustep at EOS level 4, 5 minutes.  Seat 10, UE's 7   02/14/24 NuStep, level 3, seat 12, 10'  UE/LE Standing:  in // bars with bil UE assist  Abduction with toe forward 10X  Vectors 5X5" each Standing heel/toe raises x 20 Slant board stretch 3X30"  Ambulation with WBQC outside // bars with CGA x 40 feet Cybex hamstring curls, 6 plates x 10 x 2 Body craft TKE  3Pl 10 x 2 each LE with walker for stability  02/11/24 NuStep, level 3, seat 13, 5' Cybex hamstring curls, 6 plates x 10 x 2 Standing TKE, RTB x 3" x 10 x 2 Hip vectors, YTB x 10 x 2 Seated calf stretch x 30" x 2 Standing heel/toe raises x 10 x 2 Ambulation with L UE inside // bars with CGA x 2 rounds Ambulation with large base quad cane on L inside // bars with CGA x 2 rounds  02/07/24 NuStep, level 3, seat 10, 5' Cybex hamstring curls, 5 plates x 10 x 2 Heel raises 20X Toe raises 20X Mini squats x 3" x 10 x 2 Heel raises x 10 x 2 Forward step ups  x 4" box 10 x 2 Hip abduction 2X10 Hip extension  2X10 Ambulation with RW   02/04/24 NuStep, level 1, seat 13, 5' Cybex hamstring curls, 5 plates x 10 x 2 Mini squats x 3" x 10 x 2 Heel raises x 10 x 2 Forward step ups x 2" box 10 x 2  02/03/24 Seated:  LAQ 10x5"  Sit to stands 5X no UE Logroll technique/education Supine:  Bridge 10X  SLR 10X each with core stab/breathing Standing in walker hip abduction 10X each Ambulation with RWX 100 feet Stair negotiation with 1 HR and SPC step to pattern 7" height X 2 RT  01/20/24 Evaluation and patient education done                                                                                                                                 PATIENT EDUCATION:  Education details: Educated on the goals and course of rehab.  Person educated: Patient and Spouse Education method: Explanation Education comprehension: verbalized understanding  HOME EXERCISE PROGRAM: Access Code: [email or web address] URL: [email or web address] 02/11/2024 - Standing 3-Way Leg Reach with Resistance at Ankles and Counter Support  - 1 x daily - 7 x weekly - 2 sets - 10 reps - Seated Calf Stretch with Strap  - 2 x daily - 7 x weekly - 3 reps - 30 hold - Heel Toe Raises with Counter Support  - 2 x daily - 7 x weekly - 2 sets - 10 reps  Date: 02/03/2024 Prepared by: Emeline Gins Exercises - Beginner Bridge  - 2 x daily - 7 x weekly - 10 reps - 3 sec hold - Small Range Straight Leg Raise  - 2 x daily - 7 x weekly - 10 reps - Sitting to Supine Roll  - 7 x weekly - Seated Long Arc Quad  - 2 x daily - 7 x weekly - 10 reps - 5 sec hold - Sit to Stand Without Arm Support  - 2 x daily - 7 x weekly - 2 sets - 5 reps - Standing Hip Abduction  - 2 x daily - 7 x weekly - 10 reps  01/15/2024 - Mini Squat with Counter Support  - 2 x daily - 7 x weekly - 2 sets - 10 reps - 3 hold - Heel Raises with Counter Support  - 2 x daily - 7 x weekly - 2 sets - 10 reps   ASSESSMENT:  CLINICAL IMPRESSION: Patient tolerates  session well with the appropriate amount of fatigue. He arrives to the session ambulating with RW secondary to feeling bad yesterday due to radiation treatment. Today's session focused on LE strength, and balance activities with the addition of walkouts for balance challenge and navigating obstacles to work towards stair navigation. Patient benefits from the addition of ankle weights during functional activity for more controlled LE movement/foot placement. Multiple rest breaks required throughout. Patient will benefit from continued skilled physical  therapy in order to address the above deficits to return to PLOF and improve QOL.    Patient is a 48 y.o. male who was seen today for physical therapy evaluation and treatment for s/p spinal surgery due to metastatic cancer to the spine. Patient's condition is further defined by difficulty with transfers, ambulation and stair negotiation due to weakness, and impaired balance and proprioception. Skilled PT is required to address the impairments and functional limitations listed below.    OBJECTIVE IMPAIRMENTS: Abnormal gait, decreased activity tolerance, decreased balance, decreased mobility, difficulty walking, decreased strength, impaired flexibility, and impaired tone.   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   REHAB POTENTIAL: Fair    CLINICAL DECISION MAKING: Evolving/moderate complexity  EVALUATION COMPLEXITY: Moderate   GOALS: Goals reviewed with patient? Yes  SHORT TERM GOALS: Target date: 03/02/24  Pt will  demonstrate indep in HEP to facilitate carry-over of skilled services and improve functional outcomes Goal status: IN PROGRESS  2.  Pt will be able to perform sit-to-stand with modified independence (use of rolling walker) Baseline: see above Goal status: IN PROGRESS  3.  Pt will be able to ambulate for > 200 ft with modified independence and use of rolling walker Baseline: see above Goal status: IN PROGRESS  LONG TERM GOALS: Target date: 04/13/24  Pt will be able to stand up >5x in the 30 sec chair-to-stand test without assistance to demonstrate clinically significant improvement in balance and LE strength.    Baseline: see above Goal status: IN PROGRESS  2.  Pt will increase by at least 120 ft in order to demonstrate clinically significant improvement in community ambulation Baseline: 64 ft Goal status: IN PROGRESS  3.  Pt will demonstrate increase in LE strength to 3+/5 to facilitate ease and safety in ambulation  Baseline: 2+/5 Goal status: INITIAL  4.  Pt will be able to negotiate stairs (4-5 steps) with alternating feet and use of railings with CGA-SBA Baseline: to be determined Goal status: INITIAL  5.  Pt will be able to ambulate with LRAD > 200 ft Baseline: see above Goal status:IN PROGRESS  PLAN:  PT FREQUENCY:  3x/week for the 1st month, re-assess needs on the 4th week  PT DURATION: 12 weeks  PLANNED INTERVENTIONS: 97164- PT Re-evaluation, 97110-Therapeutic exercises, 97530- Therapeutic activity, [id number]- Neuromuscular re-education, 97535- Self Care, 16109- Manual therapy, [phone]- Gait training, [phone]- Orthotic Fit/training, Patient/Family education, and Stair training.  PLAN FOR NEXT SESSION: Progress LE strengthening, balance, and gait activities.  Gait with LRAD  9:38 AM, 02/23/24 Chryl Heck, PT, DPT [facility]]

## 2024-02-25 ENCOUNTER — Encounter (HOSPITAL_COMMUNITY)

## 2024-02-25 ENCOUNTER — Ambulatory Visit (HOSPITAL_COMMUNITY)

## 2024-02-29 ENCOUNTER — Ambulatory Visit (HOSPITAL_COMMUNITY)

## 2024-02-29 ENCOUNTER — Ambulatory Visit (HOSPITAL_COMMUNITY): Admitting: Physical Therapy

## 2024-02-29 ENCOUNTER — Encounter (HOSPITAL_COMMUNITY): Payer: Self-pay

## 2024-02-29 DIAGNOSIS — C7951 Secondary malignant neoplasm of bone: Secondary | ICD-10-CM | POA: Diagnosis not present

## 2024-02-29 DIAGNOSIS — R262 Difficulty in walking, not elsewhere classified: Secondary | ICD-10-CM

## 2024-02-29 DIAGNOSIS — R29898 Other symptoms and signs involving the musculoskeletal system: Secondary | ICD-10-CM

## 2024-02-29 NOTE — Therapy (Signed)
 OUTPATIENT PHYSICAL THERAPY Treatment  Patient Name: Scott Eaton MRN: [id number] DOB:1976-04-25, 48 y.o., male Today's Date: 02/29/2024  END OF SESSION:  PT End of Session - 02/29/24 1349     Visit Number 11    Number of Visits 28    Date for PT Re-Evaluation 05/03/24    Authorization Type [employer] (no auth, visit limit 60 combined with OT)    Authorization - Visit Number 14   count addended; combined OT, PT and wound care   Authorization - Number of Visits 60    PT Start Time 1302    PT Stop Time 1344    PT Time Calculation (min) 42 min    Equipment Utilized During Treatment Gait belt    Activity Tolerance Patient limited by fatigue;Patient tolerated treatment well    Behavior During Therapy WFL for tasks assessed/performed                Past Medical History:  Diagnosis Date   Cervical radiculopathy    cervical stenosis   Complication of anesthesia    They had a problem putting pt to sleep   Obesity    Past Surgical History:  Procedure Laterality Date   LAMINECTOMY N/A 10/12/2019   Procedure: Cervical Three-Five Posterior Cervical Fusion with Laminectomy and Resection of Tumor;  Surgeon: Maeola Harman, MD;  Location: [facility];  Service: Neurosurgery;  Laterality: N/A;  Cervical 3-4 Posterior cervical fusion with laminectomy and resection of tumor   TONSILLECTOMY     WISDOM TOOTH EXTRACTION     Patient Active Problem List   Diagnosis Date Noted   Status post cervical spinal fusion 10/12/2019   Elevated BP without diagnosis of hypertension 10/05/2019   COVID-19 virus infection 10/04/2019   Leukocytosis 10/04/2019   Metastatic cancer to spine (HCC) 10/02/2019    PCP: None on file  REFERRING PROVIDER: [name], PA  REFERRING DIAG: C79.51 (ICD-10-CM) - Metastatic cancer to spine [facility])  Rationale for Evaluation and Treatment: Rehabilitation  THERAPY DIAG:  Difficulty in walking, not elsewhere classified  Weakness of both lower  extremities  Other symptoms and signs involving the musculoskeletal system  ONSET DATE: on 12/16/23: - ARTHRODESIS, POSTERIOR OR POSTEROLATERAL TECHNIQUE, SINGLE interspace; THORACIC (WITH LATERAL TRANSVERSE TECHNIQUE, WHEN PERFORMED) POSTERIOR SEGMENTAL INSTRUMENTATION (EG, PEDICLE FIXATION, DUAL RODS WITH MULTIPLE HOOKS AND SUBLAMINAR WIRES); 7 TO 12 VERTEBRAL SEGMENTS (LIST IN ADDITION TO PRIMARY PROCEDURE)   - LAMINECTOMY FOR BIOPSY/EXCISION OF INTRASPINAL NEOPLASM; EXTRADURAL, THORACIC VERTEBRAL CORPECTOMY (VERTEBRAL BODY RESECTION), PARTIAL OR COMPLETE, FOR EXCISION OF INTRASPINAL LESION, SINGLE SEGMENT; EXTRADURAL, THORACIC BY THORACOLUMBAR APPROACH  - PERCU VERTEBROPLASTY, 1 VERTEBRAL BODY, UNI OR BILATERAL INJECTION, INCL ALL IMAGING GUIDANCE; EACH ADD CERVICOTHORACIC OR LUMBOSACRAL VERTEBRAL BODY (LIST IN ADDITION TO CODE FOR PRIMARY PROCEDURE) TRANSPEDICULAR APPROACH WITH DECOMPRESSION OF SPINAL CORD, EQUINA AND/OR NERVE ROOT(S) (EG, HERNIATED INTERVERTEBRAL DISC), SINGLE SEGMENT; THORACIC ALLOGRAFT, MORSELIZED, OR PLACEMENT OF OSTEOPROMOTIVE MATERIAL, FOR SPINE SURGERY ONLY (LIST IN ADDITION TO PRIMARY PROCEDURE)  - INSERTION INTERBODY BIOMECHANICAL DEVICE WITH INTEGRAL ANTERIOR INSTRUMENTATION, TO INTERVERTEBRAL DISC SPACE IN CONJUNCTION INTERBODY ARTHRODESIS, EACH INTERSPACE (LIST CODE FOR PRIMARY PROCEDURE)   SUBJECTIVE:  SUBJECTIVE STATEMENT: Pt arrives with RW, pt and wife reprts side effects since the radiation, stated some difficulty swollen.  No reports of pain or recent falls.  Pt feels weak today.   EVAL: Arrives to the clinic with c/o weakness on the legs (R>L). Also reports that the legs are tingly. Patient had surgery for his midback on 12/16/23 (see above) due to metastatic cancer to the  spine (patient has rods and screws). Stayed in the hospital until 12/30/23. Went to the rehab hospital in [place] 12/30/23 till 01/17/24 where patient had PT/OT. Patient is now able to do bed mobility (supine to sit) with some assistance on the leg every once in a while but patient can be independent most of the time. Patient is also now able to stand up on his own with rolling walker and some assistance from the wife.Patient can do bed to chair transfers by scooting. Patient also states that he is able to walk for around 70 ft with rolling walker and some stand by assist from caregivers. On his last day at the rehab hospital, patient was able to do stairs (4 steps x 4 rounds) in the rehab hospital with CGA. Patient is currently sleeping on the first floor of the house in a recliner because he cannot do his 13-steps stairs at home. Patient is now referred to outpatient PT evaluation and management.  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 No  PRECAUTIONS: Other: spinal  RED FLAGS: None   WEIGHT BEARING RESTRICTIONS: No  FALLS:  Has patient fallen in last 6 months? No  LIVING ENVIRONMENT: Lives with: lives with their family Lives in: House/apartment Stairs: Yes: Internal: 13 steps; can reach both and External: 1 steps; none Has following equipment at home: Single point cane, Walker - 2 wheeled, Wheelchair (manual), shower chair, and Shower bench  OCCUPATION: on disability  PLOF: Needs assistance with ADLs, Needs assistance with gait, and Needs assistance with transfers  PATIENT GOALS: "to get back to my main function - climbing steps"  NEXT MD VISIT: unknown  OBJECTIVE:  Note: Objective measures were completed at Evaluation unless otherwise noted.  DIAGNOSTIC FINDINGS:  new thoracic lesions found in PET scan   PATIENT SURVEYS:   Neuro Quality of Life to be determined  COGNITION: Overall cognitive status: Within functional limits for tasks assessed     SENSATION: Light touch: impaired on B with R > L  MUSCLE LENGTH: Hamstrings: mild to moderate restriction  POSTURE: rounded shoulders and forward head  LOWER EXTREMITY ROM:    MMT Right eval Left eval  Hip flexion Around 20%  WFL  Hip extension    Hip abduction    Hip adduction    Hip internal rotation    Hip external rotation    Knee flexion [facility] WFL  Knee extension Around 80% WFL  Ankle dorsiflexion Around 50% WFL  Ankle plantarflexion [facility] WFL  Ankle inversion    Ankle eversion     (Blank rows = not tested)  LOWER EXTREMITY MMT:     Active  Right eval Left eval  Hip flexion 2+ 4-  Hip extension    Hip abduction 3+ 4-  Hip adduction    Hip internal rotation    Hip external rotation    Knee flexion 3+ 4-  Knee extension 2+ 4-  Ankle dorsiflexion  2+ 4-  Ankle plantarflexion 3+ 4-  Ankle inversion    Ankle eversion     (Blank rows = not tested)  FUNCTIONAL TESTS:  2 minute walk test: 64 ft with CGA and rolling walker 30 sec chair stand test: 6x with CGA and UE assist on armrest/rolling walker.  TRANSFERS  Bed to chair (and back): CGA to SBA with side scooting  Sit-to-stand: CGA with rolling walker GAIT: Distance walked: 64 ft Assistive device utilized: Walker - 2 wheeled Level of assistance: CGA and with w/c follow Comments: done during , decreased swing on R, R foot slap, decreased stance time R  TREATMENT DATE:  02/29/24: -STS no HHA 8 reps prior fatigue  -Bodycraft retro/forward walkout 3Pl 5x with RW for stability -Leg press 3Pl 2x 10 -Obstacle course with cone weaving then over obstacle 2RT -Hamstring curl 3# 8 reps Rt LE and 10 reps due to fatiuge  Attempted supine bridge- unable to tolerate  Sidelying: Clam RTB 2 set 10reps  02/23/24 -Nustep seat 10 level 4 x 5' dynamic warm up -Standing forward/backward  walk outs w/ RW for support, ~80ft, 5x on plate 2, 5x on plate 3, pt with good control but varying step lengths and apttern -Stepping over orange obstacle in // bars, x10, 2nd set addition of 3 lb ankle weight shows improved consistency and control with step placement, v cues for decreasing UE support t/o -Side stepping in // bar, 3lb ankle weights, 6x down and back, v cues for decreasing UE support t/o  02/21/24 Nustep seat 10 level 4 x 5' dynamic warm up Standing: Heel raises x 10 Heel raises on incline with 1 UE assist x 10 Toe raises on decline 2 x 10 with 1 UE assist 5# kettle bell mini squats 2 x 10 Cone taps 3 cones x 5 each leg with 1 to 2 UE assist as needed Red theraband sidestepping // bars down and back x 5 Red theraband hip extension 2 x 10   02/18/24: Standing at // bars: SBQC used for short distance, RW for Toe tapping 6in step height RW 194ft STS (elevated height) no HHA 10x Sidestep RTB around thighs 3RT inside // bars Body craft leg press seat all way back 4 PL 2X10 Lunges onto 6" step bil UE 2X10 Nustep  [place] trail 5'  02/16/24 Standing at steps Heelraises 20X Hip abduction toe fwd 2X10 Hip extensions 2X10 Lunges onto 7" step bil UE 2X10 Slant board stretch 2X30" Ambulation with WBQC while in clinic/between machines Cybex hamstring curls, 7 plates x 10 x 2 Body craft TKE  4Pl 10 x 2 each LE with walker for stability Body craft leg press seat all way back 4 PL 2X10 Nustep at EOS level 4, 5 minutes.  Seat 10, UE's 7   02/14/24 NuStep, level 3, seat 12, 10'  UE/LE Standing:  in // bars with bil UE assist  Abduction with toe forward 10X  Vectors 5X5" each Standing heel/toe raises x 20 Slant board stretch 3X30"  Ambulation with WBQC outside // bars with CGA x 40 feet Cybex hamstring curls, 6 plates x 10 x 2 Body craft TKE  3Pl 10 x 2 each LE with walker for stability  02/11/24 NuStep, level 3, seat 13, 5' Cybex hamstring curls, 6 plates x 10  x 2 Standing TKE, RTB x 3" x 10 x 2 Hip vectors, YTB x 10 x 2 Seated calf stretch x 30" x 2 Standing heel/toe raises x 10 x  2 Ambulation with L UE inside // bars with CGA x 2 rounds Ambulation with large base quad cane on L inside // bars with CGA x 2 rounds  02/07/24 NuStep, level 3, seat 10, 5' Cybex hamstring curls, 5 plates x 10 x 2 Heel raises 20X Toe raises 20X Mini squats x 3" x 10 x 2 Heel raises x 10 x 2 Forward step ups x 4" box 10 x 2 Hip abduction 2X10 Hip extension 2X10 Ambulation with RW   02/04/24 NuStep, level 1, seat 13, 5' Cybex hamstring curls, 5 plates x 10 x 2 Mini squats x 3" x 10 x 2 Heel raises x 10 x 2 Forward step ups x 2" box 10 x 2  02/03/24 Seated:  LAQ 10x5"  Sit to stands 5X no UE Logroll technique/education Supine:  Bridge 10X  SLR 10X each with core stab/breathing Standing in walker hip abduction 10X each Ambulation with RWX 100 feet Stair negotiation with 1 HR and SPC step to pattern 7" height X 2 RT  01/20/24 Evaluation and patient education done                                                                                                                                 PATIENT EDUCATION:  Education details: Educated on the goals and course of rehab.  Person educated: Patient and Spouse Education method: Explanation Education comprehension: verbalized understanding  HOME EXERCISE PROGRAM: Access Code: [email or web address] URL: [email or web address]   02/11/2024 - Standing 3-Way Leg Reach with Resistance at Ankles and Counter Support  - 1 x daily - 7 x weekly - 2 sets - 10 reps - Seated Calf Stretch with Strap  - 2 x daily - 7 x weekly - 3 reps - 30 hold - Heel Toe Raises with Counter Support  - 2 x daily - 7 x weekly - 2 sets - 10 reps  Date: 02/03/2024 Prepared by: Emeline Gins Exercises - Beginner Bridge  - 2 x daily - 7 x weekly - 10 reps - 3 sec hold - Small Range Straight Leg Raise  - 2 x daily - 7 x weekly - 10  reps - Sitting to Supine Roll  - 7 x weekly - Seated Long Arc Quad  - 2 x daily - 7 x weekly - 10 reps - 5 sec hold - Sit to Stand Without Arm Support  - 2 x daily - 7 x weekly - 2 sets - 5 reps - Standing Hip Abduction  - 2 x daily - 7 x weekly - 10 reps  01/15/2024 - Mini Squat with Counter Support  - 2 x daily - 7 x weekly - 2 sets - 10 reps - 3 hold - Heel Raises with Counter Support  - 2 x daily - 7 x weekly - 2 sets - 10 reps     ASSESSMENT:  CLINICAL IMPRESSION: Session focus with LE strengthening and balance activities.  Pt limited by fatigue levels 5-6/10, monitored through session, rest breaks required throughout session.  Discussed benefits of mat activities as well as standing for days patient does not feel up to standing, added clam for specific gluteal strengthening with theraband to HEP.  Pt unable to tolerate supine position with reports since back surgery.  Good bed mobility with rolling.  Added obstacle course for strengthening with noted hamstring weakness stepping over block, added specific hamstring strengthening this session.  No reports of pain through session.     Patient is a 48 y.o. male who was seen today for physical therapy evaluation and treatment for s/p spinal surgery due to metastatic cancer to the spine. Patient's condition is further defined by difficulty with transfers, ambulation and stair negotiation due to weakness, and impaired balance and proprioception. Skilled PT is required to address the impairments and functional limitations listed below.    OBJECTIVE IMPAIRMENTS: Abnormal gait, decreased activity tolerance, decreased balance, decreased mobility, difficulty walking, decreased strength, impaired flexibility, and impaired tone.   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   REHAB POTENTIAL: Fair    CLINICAL DECISION MAKING: Evolving/moderate complexity  EVALUATION COMPLEXITY: Moderate   GOALS: Goals reviewed with patient? Yes  SHORT TERM GOALS: Target date: 03/02/24  Pt will demonstrate indep in HEP to facilitate carry-over of skilled services and improve functional outcomes Goal status: IN PROGRESS  2.  Pt will be able to perform sit-to-stand with modified independence (use of rolling walker) Baseline: see above Goal status: IN PROGRESS  3.  Pt will be able to ambulate for > 200 ft with modified independence and use of rolling walker Baseline: see above Goal status: IN PROGRESS  LONG TERM GOALS: Target date: 04/13/24  Pt will be able to stand up >5x in the 30 sec chair-to-stand test without assistance to demonstrate clinically significant improvement in balance and LE strength.    Baseline: see above Goal status: IN PROGRESS  2.  Pt will increase by at least 120 ft in order to demonstrate clinically significant improvement in community ambulation Baseline: 64 ft Goal status: IN PROGRESS  3.  Pt will demonstrate increase in LE strength to 3+/5 to facilitate ease and safety in ambulation  Baseline: 2+/5 Goal status: INITIAL  4.  Pt will be able to negotiate stairs (4-5 steps) with alternating feet and use of railings with CGA-SBA Baseline: to be determined Goal status: INITIAL  5.  Pt will be able to ambulate with LRAD > 200 ft Baseline: see above Goal status:IN PROGRESS  PLAN:  PT FREQUENCY:  3x/week for the 1st month, re-assess needs on the 4th week  PT DURATION: 12 weeks  PLANNED INTERVENTIONS: 97164- PT Re-evaluation, 97110-Therapeutic exercises, 97530- Therapeutic activity,  [id number]- Neuromuscular re-education, 97535- Self Care, [id number]- Manual therapy, [phone]- Gait training, [phone]- Orthotic Fit/training, Patient/Family education, and Stair training.  PLAN FOR NEXT SESSION: Progress LE strengthening, balance, and gait activities.  Gait with LRAD.  Review goals next session.  Add quadruped if able to tolerate position.  Becky Sax, LPTA/CLT; CBIS [phone]  3:49 PM, 02/29/24

## 2024-03-01 ENCOUNTER — Ambulatory Visit (HOSPITAL_COMMUNITY)

## 2024-03-01 ENCOUNTER — Encounter (HOSPITAL_COMMUNITY): Payer: Self-pay

## 2024-03-01 DIAGNOSIS — C7951 Secondary malignant neoplasm of bone: Secondary | ICD-10-CM | POA: Diagnosis not present

## 2024-03-01 DIAGNOSIS — R262 Difficulty in walking, not elsewhere classified: Secondary | ICD-10-CM

## 2024-03-01 DIAGNOSIS — R29898 Other symptoms and signs involving the musculoskeletal system: Secondary | ICD-10-CM

## 2024-03-01 NOTE — Therapy (Addendum)
 OUTPATIENT PHYSICAL THERAPY Treatment Progress Note Reporting Period 01/20/24 to 03/01/24  See note below for Objective Data and Assessment of Progress/Goals.     Patient Name: Scott Eaton MRN: [id number] DOB:1976/06/10, 48 y.o., male Today's Date: 03/01/2024  END OF SESSION:  PT End of Session - 03/01/24 0854     Visit Number 12    Number of Visits 28    Date for PT Re-Evaluation 05/03/24    Authorization Type [employer] P[employer] (no auth, visit limit 60 combined with OT)    Authorization - Visit Number 15    Authorization - Number of Visits 60    PT Start Time 0848    PT Stop Time 0930    PT Time Calculation (min) 42 min    Equipment Utilized During Treatment Gait belt    Activity Tolerance Patient limited by fatigue;Patient tolerated treatment well    Behavior During Therapy WFL for tasks assessed/performed                Past Medical History:  Diagnosis Date   Cervical radiculopathy    cervical stenosis   Complication of anesthesia    They had a problem putting pt to sleep   Obesity    Past Surgical History:  Procedure Laterality Date   LAMINECTOMY N/A 10/12/2019   Procedure: Cervical Three-Five Posterior Cervical Fusion with Laminectomy and Resection of Tumor;  Surgeon: Maeola Harman, MD;  Location[facility] OR;  Service: Neurosurgery;  Laterality: N/A;  Cervical 3-4 Posterior cervical fusion with laminectomy and resection of tumor   TONSILLECTOMY     WISDOM TOOTH EXTRACTION     Patient Active Problem List   Diagnosis Date Noted   Status post cervical spinal fusion 10/12/2019   Elevated BP without diagnosis of hypertension 10/05/2019   COVID-19 virus infection 10/04/2019   Leukocytosis 10/04/2019   Metastatic cancer to spine (HCC) 10/02/2019    PCP: None on file  REFERRING PROVIDER: [name], PA  REFERRING DIAG: C79.51 (ICD-10-CM) - Metastatic cancer to spine [facility])  Rationale for Evaluation and Treatment: Rehabilitation  THERAPY DIAG:  Difficulty  in walking, not elsewhere classified  Weakness of both lower extremities  Other symptoms and signs involving the musculoskeletal system  ONSET DATE: on 12/16/23: - ARTHRODESIS, POSTERIOR OR POSTEROLATERAL TECHNIQUE, SINGLE interspace; THORACIC (WITH LATERAL TRANSVERSE TECHNIQUE, WHEN PERFORMED) POSTERIOR SEGMENTAL INSTRUMENTATION (EG, PEDICLE FIXATION, DUAL RODS WITH MULTIPLE HOOKS AND SUBLAMINAR WIRES); 7 TO 12 VERTEBRAL SEGMENTS (LIST IN ADDITION TO PRIMARY PROCEDURE)   - LAMINECTOMY FOR BIOPSY/EXCISION OF INTRASPINAL NEOPLASM; EXTRADURAL, THORACIC VERTEBRAL CORPECTOMY (VERTEBRAL BODY RESECTION), PARTIAL OR COMPLETE, FOR EXCISION OF INTRASPINAL LESION, SINGLE SEGMENT; EXTRADURAL, THORACIC BY THORACOLUMBAR APPROACH  - PERCU VERTEBROPLASTY, 1 VERTEBRAL BODY, UNI OR BILATERAL INJECTION, INCL ALL IMAGING GUIDANCE; EACH ADD CERVICOTHORACIC OR LUMBOSACRAL VERTEBRAL BODY (LIST IN ADDITION TO CODE FOR PRIMARY PROCEDURE) TRANSPEDICULAR APPROACH WITH DECOMPRESSION OF SPINAL CORD, EQUINA AND/OR NERVE ROOT(S) (EG, HERNIATED INTERVERTEBRAL DISC), SINGLE SEGMENT; THORACIC ALLOGRAFT, MORSELIZED, OR PLACEMENT OF OSTEOPROMOTIVE MATERIAL, FOR SPINE SURGERY ONLY (LIST IN ADDITION TO PRIMARY PROCEDURE)  - INSERTION INTERBODY BIOMECHANICAL DEVICE WITH INTEGRAL ANTERIOR INSTRUMENTATION, TO INTERVERTEBRAL DISC SPACE IN CONJUNCTION INTERBODY ARTHRODESIS, EACH INTERSPACE (LIST CODE FOR PRIMARY PROCEDURE)   SUBJECTIVE:  SUBJECTIVE STATEMENT: Pt reports he has increased soreness following session yesterday, feels he slept wrong.  Reports he uses QC to go up and down stairs.  Feels he has improved by 50% since beginning therapy.  Wife stated she doesn't feel need for wound care this week, reports it is looking better.   EVAL: Arrives to  the clinic with c/o weakness on the legs (R>L). Also reports that the legs are tingly. Patient had surgery for his midback on 12/16/23 (see above) due to metastatic cancer to the spine (patient has rods and screws). Stayed in the hospital until 12/30/23. Went to the rehab hospital in [place] 12/30/23 till 01/17/24 where patient had PT/OT. Patient is now able to do bed mobility (supine to sit) with some assistance on the leg every once in a while but patient can be independent most of the time. Patient is also now able to stand up on his own with rolling walker and some assistance from the wife.Patient can do bed to chair transfers by scooting. Patient also states that he is able to walk for around 70 ft with rolling walker and some stand by assist from caregivers. On his last day at the rehab hospital, patient was able to do stairs (4 steps x 4 rounds) in the rehab hospital with CGA. Patient is currently sleeping on the first floor of the house in a recliner because he cannot do his 13-steps stairs at home. Patient is now referred to outpatient PT evaluation and management.  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 No and Yes: NPRS scale: yes Pain location: upper back Lt side Pain description: sore Aggravating factors: supine position Relieving factors: pain medication, muscle relaxor  PRECAUTIONS: Other: spinal  RED FLAGS: None   WEIGHT BEARING RESTRICTIONS: No  FALLS:  Has patient fallen in last 6 months? No  LIVING ENVIRONMENT: Lives with: lives with their family Lives in: House/apartment Stairs: Yes: Internal: 13 steps; can reach both and External: 1 steps; none Has following equipment at home: Single point cane, Walker - 2 wheeled, Wheelchair (manual), shower chair, and Shower bench  OCCUPATION: on disability  PLOF: Needs assistance  with ADLs, Needs assistance with gait, and Needs assistance with transfers  PATIENT GOALS: "to get back to my main function - climbing steps"  NEXT MD VISIT: unknown  OBJECTIVE:  Note: Objective measures were completed at Evaluation unless otherwise noted.  DIAGNOSTIC FINDINGS:  new thoracic lesions found in PET scan   PATIENT SURVEYS:  Neuro Quality of Life to be determined  COGNITION: Overall cognitive status: Within functional limits for tasks assessed     SENSATION: Light touch: impaired on B with R > L  MUSCLE LENGTH: Hamstrings: mild to moderate restriction  POSTURE: rounded shoulders and forward head  LOWER EXTREMITY ROM:    MMT Right eval Left eval  Hip flexion Around 20%  WFL  Hip extension    Hip abduction    Hip adduction    Hip internal rotation    Hip external rotation    Knee flexion [facility] WFL  Knee extension Around 80% WFL  Ankle dorsiflexion Around 50% WFL  Ankle plantarflexion [facility] WFL  Ankle inversion    Ankle eversion     (Blank rows = not tested)  LOWER EXTREMITY MMT:     Active  Right eval Left eval  Right 03/01/24 Left 03/01/24  Hip flexion 2+ 4- 3- 4/5  Hip extension   Sidelying 2+ Sidelying 3/5  Hip abduction 3+ 4- 3+ 4-  Hip adduction      Hip internal rotation      Hip external rotation      Knee flexion 3+ 4- 3+ 4/5  Knee extension 2+ 4- 4- 4/5  Ankle dorsiflexion 2+ 4- 3+ 4/5  Ankle plantarflexion 3+ 4-    Ankle inversion      Ankle eversion       (Blank rows = not tested)  FUNCTIONAL TESTS:  2 minute walk test: 64 ft with CGA and rolling walker 30 sec chair stand test: 6x with CGA and UE assist on armrest/rolling walker.  03/01/24: 226 with RW CGA 30 sec chair stand test: 6x with no UE, RW infront though did not use  TRANSFERS  Bed to chair (and back): CGA to SBA with side scooting  Sit-to-stand: CGA with rolling walker GAIT: Distance walked: 64 ft Assistive device utilized: Walker - 2 wheeled Level of  assistance: CGA and with w/c follow Comments: done during , decreased swing on R, R foot slap, decreased stance time R  TREATMENT DATE:  03/01/24: Reviewed goals with RW 226 CGA 30 sec chair stand test: 6x with no UE, RW infront though did not use MMT see above Stairs reciprocal pattern with 2 HR step to pattern 4RT (has 13 steps at home)  Squats front of chair 2 sets x 5 reps  Nustep [place] L3 resistance with UE and LE SPM > 65 5'   02/29/24: -STS no HHA 8 reps prior fatigue  -[employer] retro/forward walkout 3Pl 5x with RW for stability -Leg press 3Pl 2x 10 -Obstacle course with cone weaving then over obstacle 2RT -Hamstring curl 3# 8 reps Rt LE and 10 reps due to fatiuge  Attempted supine bridge- unable to tolerate  Sidelying: Clam RTB 2 set 10reps  02/23/24 -Nustep seat 10 level 4 x 5' dynamic warm up -Standing forward/backward walk outs w/ RW for support, ~61ft, 5x on plate 2, 5x on plate 3, pt with good control but varying step lengths and apttern -Stepping over orange obstacle in // bars, x10, 2nd set addition of 3 lb ankle weight shows improved consistency and control with step placement, v cues for decreasing UE support t/o -Side stepping in // bar, 3lb ankle weights, 6x down and back, v cues for decreasing UE support t/o  02/21/24 Nustep seat 10 level 4 x 5' dynamic warm up Standing: Heel raises x 10 Heel raises on incline with 1 UE assist x 10 Toe raises on decline 2 x 10 with 1 UE assist 5# kettle bell mini squats 2 x 10 Cone taps 3 cones x 5 each leg with 1 to 2 UE assist as needed Red theraband sidestepping // bars down and back x 5 Red theraband hip extension 2 x 10     PATIENT EDUCATION:  Education details: Educated on the goals and course of rehab.  Person educated: Patient and Spouse Education method: Explanation Education comprehension: verbalized understanding  HOME EXERCISE PROGRAM: Access Code: [email or web address] URL:  [email or web address] 02/29/24: Clam with RTB  02/11/2024 - Standing 3-Way Leg Reach with Resistance at Ankles and Counter Support  - 1 x daily - 7 x weekly - 2 sets - 10 reps - Seated Calf Stretch with Strap  - 2 x daily - 7 x weekly - 3 reps - 30 hold - Heel Toe  Raises with Counter Support  - 2 x daily - 7 x weekly - 2 sets - 10 reps  Date: 02/03/2024 Prepared by: Emeline Gins Exercises - Beginner Bridge  - 2 x daily - 7 x weekly - 10 reps - 3 sec hold - Small Range Straight Leg Raise  - 2 x daily - 7 x weekly - 10 reps - Sitting to Supine Roll  - 7 x weekly - Seated Long Arc Quad  - 2 x daily - 7 x weekly - 10 reps - 5 sec hold - Sit to Stand Without Arm Support  - 2 x daily - 7 x weekly - 2 sets - 5 reps - Standing Hip Abduction  - 2 x daily - 7 x weekly - 10 reps  01/15/2024 - Mini Squat with Counter Support  - 2 x daily - 7 x weekly - 2 sets - 10 reps - 3 hold - Heel Raises with Counter Support  - 2 x daily - 7 x weekly - 2 sets - 10 reps     ASSESSMENT:  CLINICAL IMPRESSION: Reviewed goals with the following findings:  Pt is progressing well towards goals with 3/3 STGs and 2/5 LTGs met.  Pt reports compliance with HEP daily.  Objective findings including improved cadence with and ability to complete sit to stand without HHA.  Pt continues to show LE weakness especially in hip musculature and decreased functional abilities.  Pt will  continue to benefit from skilled intervention PT with 3x/week for 4 more weeks.  Upon 4 more weeks we can decide to reduce frequency.  No wound care complete this week as wife reports it is looking good with home care and pt wishes to focus on strengthening this week.  Will assess need next week as they do wish for skilled intervention to assure if continues to heal.  Session focus with LE strengthening and balance activities.  Pt limited by fatigue levels 5-6/10, monitored through session, rest breaks required throughout session.   Discussed benefits of mat activities as well as standing for days patient does not feel up to standing, added clam for specific gluteal strengthening with theraband to HEP.  Pt unable to tolerate supine position with reports since back surgery.  Good bed mobility with rolling.  Added obstacle course for strengthening with noted hamstring weakness stepping over block, added specific hamstring strengthening this session.  No reports of pain through session.     Patient is a 48 y.o. male who was seen today for physical therapy evaluation and treatment for s/p spinal surgery due to metastatic cancer to the spine. Patient's condition is further defined by difficulty with transfers, ambulation and stair negotiation due to weakness, and impaired balance and proprioception. Skilled PT is required to address the impairments and functional limitations listed below.    OBJECTIVE IMPAIRMENTS: Abnormal gait, decreased activity tolerance, decreased balance, decreased mobility, difficulty walking, decreased strength, impaired flexibility, and impaired tone.   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   REHAB POTENTIAL: Fair    CLINICAL DECISION MAKING: Evolving/moderate complexity  EVALUATION COMPLEXITY: Moderate   GOALS: Goals reviewed with patient? Yes  SHORT  TERM GOALS: Target date: 03/02/24  Pt will demonstrate indep in HEP to facilitate carry-over of skilled services and improve functional outcomes Goal status: IN PROGRESS; 03/01/24:  Reports compliance with HEP daily Goal  status: MET  2.  Pt will be able to perform sit-to-stand with modified independence (use of rolling walker) Baseline: see above; 03/01/24:  Able to stand with no HHA or need for RW Goal status: MET  3.  Pt will be able to ambulate for > 200 ft with modified independence and use of rolling walker Baseline: see above; 03/01/24:  226 RW Goal status: MET  LONG TERM GOALS: Target date: 04/13/24  Pt will be able to stand up >5x in the 30 sec chair-to-stand test without assistance to demonstrate clinically significant improvement in balance and LE strength.    Baseline: see above; 03/01/24: 30 second chair to stand 6x no HHA  Goal status: MET  2.  Pt will increase by at least 120 ft in order to demonstrate clinically significant improvement in community ambulation Baseline: 64 ft; 03/01/24:  262ft Goal status: MET  3.  Pt will demonstrate increase in LE strength to 3+/5 to facilitate ease and safety in ambulation  Baseline: 2+/5 (see above) Goal status: IN PROGRESS  4.  Pt will be able to negotiate stairs (4-5 steps) with alternating feet and use of railings with CGA-SBA Baseline: to be determined; 03/01/24:  Able to negotiate stairs with step to pattern and need for 2HR Goal status: IN PROGRESS  5.  Pt will be able to ambulate with LRAD >300  ft Baseline: see above; 03/01/24:  230ft with RW Goal status:IN PROGRESS  PLAN:  PT FREQUENCY:  3x/week for the 1st month, re-assess needs on the 4th week  PT DURATION: 12 weeks  PLANNED INTERVENTIONS: 97164- PT Re-evaluation, 97110-Therapeutic exercises, 97530- Therapeutic activity, [id number]- Neuromuscular re-education, 97535- Self Care, 16109- Manual therapy, [phone]- Gait training, [phone]- Orthotic Fit/training, Patient/Family education, and Stair training.  PLAN FOR NEXT SESSION: Progress LE strengthening, balance, and gait activities.  Gait with LRAD.    Becky Sax, LPTA/CLT; [place] [phone]  3:17 PM, 03/01/24

## 2024-03-02 ENCOUNTER — Ambulatory Visit (HOSPITAL_COMMUNITY)

## 2024-03-02 ENCOUNTER — Encounter (HOSPITAL_COMMUNITY): Payer: Self-pay

## 2024-03-02 DIAGNOSIS — R262 Difficulty in walking, not elsewhere classified: Secondary | ICD-10-CM

## 2024-03-02 DIAGNOSIS — C7951 Secondary malignant neoplasm of bone: Secondary | ICD-10-CM | POA: Diagnosis not present

## 2024-03-02 DIAGNOSIS — R29898 Other symptoms and signs involving the musculoskeletal system: Secondary | ICD-10-CM

## 2024-03-02 NOTE — Therapy (Signed)
 OUTPATIENT PHYSICAL THERAPY Treatment Progress Note Reporting Period 01/20/24 to 03/01/24  See note below for Objective Data and Assessment of Progress/Goals.     Patient Name: Scott Eaton MRN: [id number] DOB:1976-06-17, 48 y.o., male Today's Date: 03/02/2024  END OF SESSION:  PT End of Session - 03/02/24 0931     Visit Number 13    Number of Visits 28    Date for PT Re-Evaluation 05/03/24    Authorization Type [employer] (no auth, visit limit 60 combined with OT)    Authorization - Visit Number 16    Authorization - Number of Visits 60    Progress Note Due on Visit 6    PT Start Time [phone]   late sign in   PT Stop Time 0927    PT Time Calculation (min) 34 min    Equipment Utilized During Treatment Gait belt    Activity Tolerance Patient limited by fatigue;Patient tolerated treatment well    Behavior During Therapy WFL for tasks assessed/performed                 Past Medical History:  Diagnosis Date   Cervical radiculopathy    cervical stenosis   Complication of anesthesia    They had a problem putting pt to sleep   Obesity    Past Surgical History:  Procedure Laterality Date   LAMINECTOMY N/A 10/12/2019   Procedure: Cervical Three-Five Posterior Cervical Fusion with Laminectomy and Resection of Tumor;  Surgeon: Maeola Harman, MD;  Location: [facility] OR;  Service: Neurosurgery;  Laterality: N/A;  Cervical 3-4 Posterior cervical fusion with laminectomy and resection of tumor   TONSILLECTOMY     WISDOM TOOTH EXTRACTION     Patient Active Problem List   Diagnosis Date Noted   Status post cervical spinal fusion 10/12/2019   Elevated BP without diagnosis of hypertension 10/05/2019   COVID-19 virus infection 10/04/2019   Leukocytosis 10/04/2019   Metastatic cancer to spine (HCC) 10/02/2019    PCP: None on file  REFERRING PROVIDER: [name], PA  REFERRING DIAG: C79.51 (ICD-10-CM) - Metastatic cancer to spine [facility])  Rationale for Evaluation and  Treatment: Rehabilitation  THERAPY DIAG:  Difficulty in walking, not elsewhere classified  Weakness of both lower extremities  Other symptoms and signs involving the musculoskeletal system  ONSET DATE: on 12/16/23: - ARTHRODESIS, POSTERIOR OR POSTEROLATERAL TECHNIQUE, SINGLE interspace; THORACIC (WITH LATERAL TRANSVERSE TECHNIQUE, WHEN PERFORMED) POSTERIOR SEGMENTAL INSTRUMENTATION (EG, PEDICLE FIXATION, DUAL RODS WITH MULTIPLE HOOKS AND SUBLAMINAR WIRES); 7 TO 12 VERTEBRAL SEGMENTS (LIST IN ADDITION TO PRIMARY PROCEDURE)   - LAMINECTOMY FOR BIOPSY/EXCISION OF INTRASPINAL NEOPLASM; EXTRADURAL, THORACIC VERTEBRAL CORPECTOMY (VERTEBRAL BODY RESECTION), PARTIAL OR COMPLETE, FOR EXCISION OF INTRASPINAL LESION, SINGLE SEGMENT; EXTRADURAL, THORACIC BY THORACOLUMBAR APPROACH  - PERCU VERTEBROPLASTY, 1 VERTEBRAL BODY, UNI OR BILATERAL INJECTION, INCL ALL IMAGING GUIDANCE; EACH ADD CERVICOTHORACIC OR LUMBOSACRAL VERTEBRAL BODY (LIST IN ADDITION TO CODE FOR PRIMARY PROCEDURE) TRANSPEDICULAR APPROACH WITH DECOMPRESSION OF SPINAL CORD, EQUINA AND/OR NERVE ROOT(S) (EG, HERNIATED INTERVERTEBRAL DISC), SINGLE SEGMENT; THORACIC ALLOGRAFT, MORSELIZED, OR PLACEMENT OF OSTEOPROMOTIVE MATERIAL, FOR SPINE SURGERY ONLY (LIST IN ADDITION TO PRIMARY PROCEDURE)  - INSERTION INTERBODY BIOMECHANICAL DEVICE WITH INTEGRAL ANTERIOR INSTRUMENTATION, TO INTERVERTEBRAL DISC SPACE IN CONJUNCTION INTERBODY ARTHRODESIS, EACH INTERSPACE (LIST CODE FOR PRIMARY PROCEDURE)   SUBJECTIVE:  SUBJECTIVE STATEMENT: Stated he was sore between shoulder blades following yesterday from laying down and PT session for 3 days in a row.  Stated they were busy yesterday too.   EVAL: Arrives to the clinic with c/o weakness on the legs (R>L). Also reports that the  legs are tingly. Patient had surgery for his midback on 12/16/23 (see above) due to metastatic cancer to the spine (patient has rods and screws). Stayed in the hospital until 12/30/23. Went to the rehab hospital in [place] 12/30/23 till 01/17/24 where patient had PT/OT. Patient is now able to do bed mobility (supine to sit) with some assistance on the leg every once in a while but patient can be independent most of the time. Patient is also now able to stand up on his own with rolling walker and some assistance from the wife.Patient can do bed to chair transfers by scooting. Patient also states that he is able to walk for around 70 ft with rolling walker and some stand by assist from caregivers. On his last day at the rehab hospital, patient was able to do stairs (4 steps x 4 rounds) in the rehab hospital with CGA. Patient is currently sleeping on the first floor of the house in a recliner because he cannot do his 13-steps stairs at home. Patient is now referred to outpatient PT evaluation and management.  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 No and Yes: NPRS scale: yes 4/10 Pain location: upper back Lt side Pain description: sore Aggravating factors: supine position Relieving factors: pain medication, muscle relaxor  PRECAUTIONS: Other: spinal  RED FLAGS: None   WEIGHT BEARING RESTRICTIONS: No  FALLS:  Has patient fallen in last 6 months? No  LIVING ENVIRONMENT: Lives with: lives with their family Lives in: House/apartment Stairs: Yes: Internal: 13 steps; can reach both and External: 1 steps; none Has following equipment at home: Single point cane, Walker - 2 wheeled, Wheelchair (manual), shower chair, and Shower bench  OCCUPATION: on disability  PLOF: Needs assistance with ADLs, Needs assistance with gait, and Needs assistance with  transfers  PATIENT GOALS: "to get back to my main function - climbing steps"  NEXT MD VISIT: unknown  OBJECTIVE:  Note: Objective measures were completed at Evaluation unless otherwise noted.  DIAGNOSTIC FINDINGS:  new thoracic lesions found in PET scan   PATIENT SURVEYS:  Neuro Quality of Life to be determined  COGNITION: Overall cognitive status: Within functional limits for tasks assessed     SENSATION: Light touch: impaired on B with R > L  MUSCLE LENGTH: Hamstrings: mild to moderate restriction  POSTURE: rounded shoulders and forward head  LOWER EXTREMITY ROM:    MMT Right eval Left eval  Hip flexion Around 20%  [facility]  Hip extension    Hip abduction    Hip adduction    Hip internal rotation    Hip external rotation    Knee flexion [facility]  Knee extension Around 80% WFL  Ankle dorsiflexion Around 50% [facility]  Ankle plantarflexion [facility]  Ankle inversion    Ankle eversion     (Blank rows = not tested)  LOWER EXTREMITY MMT:     Active  Right eval Left eval Right 03/01/24 Left 03/01/24  Hip flexion 2+ 4- 3- 4/5  Hip extension   Sidelying 2+ Sidelying 3/5  Hip  abduction 3+ 4- 3+ 4-  Hip adduction      Hip internal rotation      Hip external rotation      Knee flexion 3+ 4- 3+ 4/5  Knee extension 2+ 4- 4- 4/5  Ankle dorsiflexion 2+ 4- 3+ 4/5  Ankle plantarflexion 3+ 4-    Ankle inversion      Ankle eversion       (Blank rows = not tested)  FUNCTIONAL TESTS:  2 minute walk test: 64 ft with CGA and rolling walker 30 sec chair stand test: 6x with CGA and UE assist on armrest/rolling walker.  03/01/24: 226 with RW CGA 30 sec chair stand test: 6x with no UE, RW infront though did not use  TRANSFERS  Bed to chair (and back): CGA to SBA with side scooting  Sit-to-stand: CGA with rolling walker GAIT: Distance walked: 64 ft Assistive device utilized: Walker - 2 wheeled Level of assistance: CGA and with w/c follow Comments: done during , decreased  swing on R, R foot slap, decreased stance time R  TREATMENT DATE:  03/02/24: Nustep [place] L3 resistance with UE and LE SPM > 65 5' 3D hip excursion 5x each (lateral side bend, rotation, squat 2 sets front of chair)  Lunge onto 6in step height 5x each STS 5x eccentric control  Hamstring stretch seated 2x 30" Tandem stance 3x 30"  03/01/24: Reviewed goals with RW 226 CGA 30 sec chair stand test: 6x with no UE, RW infront though did not use MMT see above Stairs reciprocal pattern with 2 HR step to pattern 4RT (has 13 steps at home)  Squats front of chair 2 sets x 5 reps  Nustep [place] L3 resistance with UE and LE SPM > 65 5'   02/29/24: -STS no HHA 8 reps prior fatigue  -[employer] retro/forward walkout 3Pl 5x with RW for stability -Leg press 3Pl 2x 10 -Obstacle course with cone weaving then over obstacle 2RT -Hamstring curl 3# 8 reps Rt LE and 10 reps due to fatiuge  Attempted supine bridge- unable to tolerate  Sidelying: Clam RTB 2 set 10reps  02/23/24 -Nustep seat 10 level 4 x 5' dynamic warm up -Standing forward/backward walk outs w/ RW for support, ~47ft, 5x on plate 2, 5x on plate 3, pt with good control but varying step lengths and apttern -Stepping over orange obstacle in // bars, x10, 2nd set addition of 3 lb ankle weight shows improved consistency and control with step placement, v cues for decreasing UE support t/o -Side stepping in // bar, 3lb ankle weights, 6x down and back, v cues for decreasing UE support t/o  02/21/24 Nustep seat 10 level 4 x 5' dynamic warm up Standing: Heel raises x 10 Heel raises on incline with 1 UE assist x 10 Toe raises on decline 2 x 10 with 1 UE assist 5# kettle bell mini squats 2 x 10 Cone taps 3 cones x 5 each leg with 1 to 2 UE assist as needed Red theraband sidestepping // bars down and back x 5 Red theraband hip extension 2 x 10     PATIENT EDUCATION:  Education details: Educated on the goals and course of rehab.   Person educated: Patient and Spouse Education method: Explanation Education comprehension: verbalized understanding  HOME EXERCISE PROGRAM: Access Code: [email or web address] URL: [email or web address] 02/29/24: Clam with RTB  02/11/2024 - Standing 3-Way Leg Reach with Resistance at Ankles and Counter Support  - 1 x daily - 7 x weekly -  2 sets - 10 reps - Seated Calf Stretch with Strap  - 2 x daily - 7 x weekly - 3 reps - 30 hold - Heel Toe Raises with Counter Support  - 2 x daily - 7 x weekly - 2 sets - 10 reps  Date: 02/03/2024 Prepared by: Emeline Gins Exercises - Beginner Bridge  - 2 x daily - 7 x weekly - 10 reps - 3 sec hold - Small Range Straight Leg Raise  - 2 x daily - 7 x weekly - 10 reps - Sitting to Supine Roll  - 7 x weekly - Seated Long Arc Quad  - 2 x daily - 7 x weekly - 10 reps - 5 sec hold - Sit to Stand Without Arm Support  - 2 x daily - 7 x weekly - 2 sets - 5 reps - Standing Hip Abduction  - 2 x daily - 7 x weekly - 10 reps  01/15/2024 - Mini Squat with Counter Support  - 2 x daily - 7 x weekly - 2 sets - 10 reps - 3 hold - Heel Raises with Counter Support  - 2 x daily - 7 x weekly - 2 sets - 10 reps     ASSESSMENT:  CLINICAL IMPRESSION: Added mobility exercises this session as today was 3rd PT session in a row and reports of increased soreness.  Began session on Nustep for dynamic warmup.  Added 3D hip excursion and hamstring stretches for mobility.  Continued gluteal strengthening with SBA and cueing for mechanics and form.  Added lunges for functional strengthening as well.  Pt limited by fatigue with need for rest breaks following 5 reps with squats and lunges due to weakness.  Monitored pain and fatigue through session with no reports of increased pain.  Progress note on 03/01/24:  Reviewed goals with the following findings:  Pt is progressing well towards goals with 3/3 STGs and 2/5 LTGs met.  Pt reports compliance with HEP daily.  Objective findings  including improved cadence with and ability to complete sit to stand without HHA.  Pt continues to show LE weakness especially in hip musculature and decreased functional abilities.  Pt will  continue to benefit from skilled intervention PT with 3x/week for 4 more weeks.  Upon 4 more weeks we can decide to reduce frequency.  No wound care complete this week as wife reports it is looking good with home care and pt wishes to focus on strengthening this week.  Will assess need next week as they do wish for skilled intervention to assure if continues to heal.  Eval:  Patient is a 48 y.o. male who was seen today for physical therapy evaluation and treatment for s/p spinal surgery due to metastatic cancer to the spine. Patient's condition is further defined by difficulty with transfers, ambulation and stair negotiation due to weakness, and impaired balance and proprioception. Skilled PT is required to address the impairments and functional limitations listed below.    OBJECTIVE IMPAIRMENTS: Abnormal gait, decreased activity tolerance, decreased balance, decreased mobility, difficulty walking, decreased strength, impaired flexibility, and impaired tone.   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  REHAB POTENTIAL: Fair    CLINICAL DECISION MAKING: Evolving/moderate complexity  EVALUATION COMPLEXITY: Moderate   GOALS: Goals reviewed with patient? Yes  SHORT TERM GOALS: Target date: 03/02/24  Pt will demonstrate indep in HEP to facilitate carry-over of skilled services and  improve functional outcomes Goal status: IN PROGRESS; 03/01/24:  Reports compliance with HEP daily Goal status: MET  2.  Pt will be able to perform sit-to-stand with modified independence (use of rolling walker) Baseline: see above; 03/01/24:  Able to stand with no HHA or need for RW Goal status: MET  3.  Pt will be able to ambulate for > 200 ft with modified independence and use of rolling walker Baseline: see above; 03/01/24:  226 RW Goal status: MET  LONG TERM GOALS: Target date: 04/13/24  Pt will be able to stand up >5x in the 30 sec chair-to-stand test without assistance to demonstrate clinically significant improvement in balance and LE strength.    Baseline: see above; 03/01/24: 30 second chair to stand 6x no HHA  Goal status: MET  2.  Pt will increase by at least 120 ft in order to demonstrate clinically significant improvement in community ambulation Baseline: 64 ft; 03/01/24:  241ft Goal status: MET  3.  Pt will demonstrate increase in LE strength to 3+/5 to facilitate ease and safety in ambulation  Baseline: 2+/5 (see above) Goal status: IN PROGRESS  4.  Pt will be able to negotiate stairs (4-5 steps) with alternating feet and use of railings with CGA-SBA Baseline: to be determined; 03/01/24:  Able to negotiate stairs with step to pattern and need for 2HR Goal status: IN PROGRESS  5.  Pt will be able to ambulate with LRAD >300  ft Baseline: see above; 03/01/24:  274ft with RW Goal status:IN PROGRESS  PLAN:  PT FREQUENCY:  3x/week for the 1st month, re-assess needs on the 4th week  PT DURATION: 12 weeks  PLANNED INTERVENTIONS: 97164- PT Re-evaluation, 97110-Therapeutic exercises, 97530- Therapeutic activity, [id number]- Neuromuscular re-education, 97535- Self Care, [id number]- Manual therapy, [id number]- Gait training, [phone]- Orthotic Fit/training, Patient/Family education, and Stair training.  PLAN FOR NEXT SESSION: Progress LE strengthening, balance, and gait  activities.  Gait with LRAD.    Becky Sax, LPTA/CLT; CBIS [phone]  10:34 AM, 03/02/24

## 2024-03-03 ENCOUNTER — Ambulatory Visit (HOSPITAL_COMMUNITY)

## 2024-03-06 ENCOUNTER — Encounter (HOSPITAL_COMMUNITY): Admitting: Physical Therapy

## 2024-03-08 ENCOUNTER — Ambulatory Visit (HOSPITAL_COMMUNITY)

## 2024-03-08 ENCOUNTER — Ambulatory Visit (HOSPITAL_COMMUNITY): Admitting: Physical Therapy

## 2024-03-08 ENCOUNTER — Encounter (HOSPITAL_COMMUNITY): Payer: Self-pay

## 2024-03-08 DIAGNOSIS — S31000D Unspecified open wound of lower back and pelvis without penetration into retroperitoneum, subsequent encounter: Secondary | ICD-10-CM | POA: Diagnosis present

## 2024-03-08 DIAGNOSIS — R29898 Other symptoms and signs involving the musculoskeletal system: Secondary | ICD-10-CM | POA: Diagnosis present

## 2024-03-08 DIAGNOSIS — R262 Difficulty in walking, not elsewhere classified: Secondary | ICD-10-CM

## 2024-03-08 NOTE — Therapy (Signed)
 OUTPATIENT PHYSICAL THERAPY Treatment  Patient Name: Scott Eaton MRN: [id number] DOB:Oct 06, 1976, 48 y.o., male Today's Date: 03/08/2024  END OF SESSION:  PT End of Session - 03/08/24 0851     Visit Number 14    Number of Visits 28    Date for PT Re-Evaluation 05/03/24    Authorization Type [employer] Comm PPO (no auth, visit limit 60 combined with OT)    Authorization - Visit Number 18    Authorization - Number of Visits 60    Progress Note Due on Visit --    PT Start Time [phone]    PT Stop Time 0930    PT Time Calculation (min) 39 min    Equipment Utilized During Treatment Gait belt    Activity Tolerance Patient limited by fatigue;Patient tolerated treatment well    Behavior During Therapy WFL for tasks assessed/performed                 Past Medical History:  Diagnosis Date   Cervical radiculopathy    cervical stenosis   Complication of anesthesia    They had a problem putting pt to sleep   Obesity    Past Surgical History:  Procedure Laterality Date   LAMINECTOMY N/A 10/12/2019   Procedure: Cervical Three-Five Posterior Cervical Fusion with Laminectomy and Resection of Tumor;  Surgeon: Maeola Harman, MD;  Location: [facility] OR;  Service: Neurosurgery;  Laterality: N/A;  Cervical 3-4 Posterior cervical fusion with laminectomy and resection of tumor   TONSILLECTOMY     WISDOM TOOTH EXTRACTION     Patient Active Problem List   Diagnosis Date Noted   Status post cervical spinal fusion 10/12/2019   Elevated BP without diagnosis of hypertension 10/05/2019   COVID-19 virus infection 10/04/2019   Leukocytosis 10/04/2019   Metastatic cancer to spine (HCC) 10/02/2019    PCP: None on file  REFERRING PROVIDER: [name], PA  REFERRING DIAG: C79.51 (ICD-10-CM) - Metastatic cancer to spine [facility])  Rationale for Evaluation and Treatment: Rehabilitation  THERAPY DIAG:  Difficulty in walking, not elsewhere classified  Weakness of both lower extremities  Other symptoms  and signs involving the musculoskeletal system  ONSET DATE: on 12/16/23: - ARTHRODESIS, POSTERIOR OR POSTEROLATERAL TECHNIQUE, SINGLE interspace; THORACIC (WITH LATERAL TRANSVERSE TECHNIQUE, WHEN PERFORMED) POSTERIOR SEGMENTAL INSTRUMENTATION (EG, PEDICLE FIXATION, DUAL RODS WITH MULTIPLE HOOKS AND SUBLAMINAR WIRES); 7 TO 12 VERTEBRAL SEGMENTS (LIST IN ADDITION TO PRIMARY PROCEDURE)   - LAMINECTOMY FOR BIOPSY/EXCISION OF INTRASPINAL NEOPLASM; EXTRADURAL, THORACIC VERTEBRAL CORPECTOMY (VERTEBRAL BODY RESECTION), PARTIAL OR COMPLETE, FOR EXCISION OF INTRASPINAL LESION, SINGLE SEGMENT; EXTRADURAL, THORACIC BY THORACOLUMBAR APPROACH  - PERCU VERTEBROPLASTY, 1 VERTEBRAL BODY, UNI OR BILATERAL INJECTION, INCL ALL IMAGING GUIDANCE; EACH ADD CERVICOTHORACIC OR LUMBOSACRAL VERTEBRAL BODY (LIST IN ADDITION TO CODE FOR PRIMARY PROCEDURE) TRANSPEDICULAR APPROACH WITH DECOMPRESSION OF SPINAL CORD, EQUINA AND/OR NERVE ROOT(S) (EG, HERNIATED INTERVERTEBRAL DISC), SINGLE SEGMENT; THORACIC ALLOGRAFT, MORSELIZED, OR PLACEMENT OF OSTEOPROMOTIVE MATERIAL, FOR SPINE SURGERY ONLY (LIST IN ADDITION TO PRIMARY PROCEDURE)  - INSERTION INTERBODY BIOMECHANICAL DEVICE WITH INTEGRAL ANTERIOR INSTRUMENTATION, TO INTERVERTEBRAL DISC SPACE IN CONJUNCTION INTERBODY ARTHRODESIS, EACH INTERSPACE (LIST CODE FOR PRIMARY PROCEDURE)   SUBJECTIVE:  SUBJECTIVE STATEMENT: Reports soreness Rt thigh that is worse at night, wife feels related to radiation.  Pain scale 3/10 today.   EVAL: Arrives to the clinic with c/o weakness on the legs (R>L). Also reports that the legs are tingly. Patient had surgery for his midback on 12/16/23 (see above) due to metastatic cancer to the spine (patient has rods and screws). Stayed in the hospital until 12/30/23. Went to the  rehab hospital in [place] 12/30/23 till 01/17/24 where patient had PT/OT. Patient is now able to do bed mobility (supine to sit) with some assistance on the leg every once in a while but patient can be independent most of the time. Patient is also now able to stand up on his own with rolling walker and some assistance from the wife.Patient can do bed to chair transfers by scooting. Patient also states that he is able to walk for around 70 ft with rolling walker and some stand by assist from caregivers. On his last day at the rehab hospital, patient was able to do stairs (4 steps x 4 rounds) in the rehab hospital with CGA. Patient is currently sleeping on the first floor of the house in a recliner because he cannot do his 13-steps stairs at home. Patient is now referred to outpatient PT evaluation and management.  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 No and Yes: NPRS scale: yes 3/10 Pain location: Rt thigh Pain description: sore Aggravating factors: supine position Relieving factors: pain medication, muscle relaxor  PRECAUTIONS: Other: spinal  RED FLAGS: None   WEIGHT BEARING RESTRICTIONS: No  FALLS:  Has patient fallen in last 6 months? No  LIVING ENVIRONMENT: Lives with: lives with their family Lives in: House/apartment Stairs: Yes: Internal: 13 steps; can reach both and External: 1 steps; none Has following equipment at home: Single point cane, Walker - 2 wheeled, Wheelchair (manual), shower chair, and Shower bench  OCCUPATION: on disability  PLOF: Needs assistance with ADLs, Needs assistance with gait, and Needs assistance with transfers  PATIENT GOALS: "to get back to my main function - climbing steps"  NEXT MD VISIT: unknown  OBJECTIVE:  Note: Objective measures were completed at Evaluation unless otherwise  noted.  DIAGNOSTIC FINDINGS:  new thoracic lesions found in PET scan   PATIENT SURVEYS:  Neuro Quality of Life to be determined  COGNITION: Overall cognitive status: Within functional limits for tasks assessed     SENSATION: Light touch: impaired on B with R > L  MUSCLE LENGTH: Hamstrings: mild to moderate restriction  POSTURE: rounded shoulders and forward head  LOWER EXTREMITY ROM:    MMT Right eval Left eval  Hip flexion Around 20%  [facility]  Hip extension    Hip abduction    Hip adduction    Hip internal rotation    Hip external rotation    Knee flexion [facility]  Knee extension Around 80% WFL  Ankle dorsiflexion Around 50% [facility]  Ankle plantarflexion [facility]  Ankle inversion    Ankle eversion     (Blank rows = not tested)  LOWER EXTREMITY MMT:     Active  Right eval Left eval Right 03/01/24 Left 03/01/24  Hip flexion 2+ 4- 3- 4/5  Hip extension   Sidelying 2+ Sidelying 3/5  Hip abduction 3+ 4- 3+ 4-  Hip adduction  Hip internal rotation      Hip external rotation      Knee flexion 3+ 4- 3+ 4/5  Knee extension 2+ 4- 4- 4/5  Ankle dorsiflexion 2+ 4- 3+ 4/5  Ankle plantarflexion 3+ 4-    Ankle inversion      Ankle eversion       (Blank rows = not tested)  FUNCTIONAL TESTS:  2 minute walk test: 64 ft with CGA and rolling walker 30 sec chair stand test: 6x with CGA and UE assist on armrest/rolling walker.  03/01/24: 226 with RW CGA 30 sec chair stand test: 6x with no UE, RW infront though did not use  TRANSFERS  Bed to chair (and back): CGA to SBA with side scooting  Sit-to-stand: CGA with rolling walker GAIT: Distance walked: 64 ft Assistive device utilized: Walker - 2 wheeled Level of assistance: CGA and with w/c follow Comments: done during , decreased swing on R, R foot slap, decreased stance time R  TREATMENT DATE:  42/25: Began on Nustep following wound tx total of 15' (not included with time this session)  STS 10x eccentric  control no HHA Heel raise 6 reps, increased Rt LE pain  Tandem stance 3x 30" last set on foam intermittent HHA Sidestep with GTB around thigh 5RT inside // bars Squat 10x Hip flexor stretch 3x 30" on 12in step  03/02/24: Nustep [place] L3 resistance with UE and LE SPM > 65 5' 3D hip excursion 5x each (lateral side bend, rotation, squat 2 sets front of chair)  Lunge onto 6in step height 5x each STS 5x eccentric control  Hamstring stretch seated 2x 30" Tandem stance 3x 30"  03/01/24: Reviewed goals with RW 226 CGA 30 sec chair stand test: 6x with no UE, RW infront though did not use MMT see above Stairs reciprocal pattern with 2 HR step to pattern 4RT (has 13 steps at home)  Squats front of chair 2 sets x 5 reps  Nustep [place] L3 resistance with UE and LE SPM > 65 5'   02/29/24: -STS no HHA 8 reps prior fatigue  -[employer] retro/forward walkout 3Pl 5x with RW for stability -Leg press 3Pl 2x 10 -Obstacle course with cone weaving then over obstacle 2RT -Hamstring curl 3# 8 reps Rt LE and 10 reps due to fatiuge  Attempted supine bridge- unable to tolerate  Sidelying: Clam RTB 2 set 10reps  02/23/24 -Nustep seat 10 level 4 x 5' dynamic warm up -Standing forward/backward walk outs w/ RW for support, ~15ft, 5x on plate 2, 5x on plate 3, pt with good control but varying step lengths and apttern -Stepping over orange obstacle in // bars, x10, 2nd set addition of 3 lb ankle weight shows improved consistency and control with step placement, v cues for decreasing UE support t/o -Side stepping in // bar, 3lb ankle weights, 6x down and back, v cues for decreasing UE support t/o  02/21/24 Nustep seat 10 level 4 x 5' dynamic warm up Standing: Heel raises x 10 Heel raises on incline with 1 UE assist x 10 Toe raises on decline 2 x 10 with 1 UE assist 5# kettle bell mini squats 2 x 10 Cone taps 3 cones x 5 each leg with 1 to 2 UE assist as needed Red theraband sidestepping // bars  down and back x 5 Red theraband hip extension 2 x 10     PATIENT EDUCATION:  Education details: Educated on the goals and course of rehab.  Person educated: Patient and Spouse Education method: Explanation Education comprehension: verbalized understanding  HOME EXERCISE PROGRAM: Access Code: [email or web address] URL: [email or web address] 02/29/24: Clam with RTB  02/11/2024 - Standing 3-Way Leg Reach with Resistance at Ankles and Counter Support  - 1 x daily - 7 x weekly - 2 sets - 10 reps - Seated Calf Stretch with Strap  - 2 x daily - 7 x weekly - 3 reps - 30 hold - Heel Toe Raises with Counter Support  - 2 x daily - 7 x weekly - 2 sets - 10 reps  Date: 02/03/2024 Prepared by: Emeline Gins Exercises - Beginner Bridge  - 2 x daily - 7 x weekly - 10 reps - 3 sec hold - Small Range Straight Leg Raise  - 2 x daily - 7 x weekly - 10 reps - Sitting to Supine Roll  - 7 x weekly - Seated Long Arc Quad  - 2 x daily - 7 x weekly - 10 reps - 5 sec hold - Sit to Stand Without Arm Support  - 2 x daily - 7 x weekly - 2 sets - 5 reps - Standing Hip Abduction  - 2 x daily - 7 x weekly - 10 reps  01/15/2024 - Mini Squat with Counter Support  - 2 x daily - 7 x weekly - 2 sets - 10 reps - 3 hold - Heel Raises with Counter Support  - 2 x daily - 7 x weekly - 2 sets - 10 reps     ASSESSMENT:  CLINICAL IMPRESSION: Session focus with functional strengthening and balance training.  Pt with thigh pain today, monitored through session.  Functional strength is improving, pt able to complete 9 STS prior fatigue without HHA.  Added dynamic surface step up training with HHA required for safety.  Reports of increased pain following sidestep exercise.  Added hip flexor stretch with reports of relief following.  Progress note on 03/01/24:  Reviewed goals with the following findings:  Pt is progressing well towards goals with 3/3 STGs and 2/5 LTGs met.  Pt reports compliance with HEP daily.  Objective  findings including improved cadence with and ability to complete sit to stand without HHA.  Pt continues to show LE weakness especially in hip musculature and decreased functional abilities.  Pt will  continue to benefit from skilled intervention PT with 3x/week for 4 more weeks.  Upon 4 more weeks we can decide to reduce frequency.  No wound care complete this week as wife reports it is looking good with home care and pt wishes to focus on strengthening this week.  Will assess need next week as they do wish for skilled intervention to assure if continues to heal.  Eval:  Patient is a 48 y.o. male who was seen today for physical therapy evaluation and treatment for s/p spinal surgery due to metastatic cancer to the spine. Patient's condition is further defined by difficulty with transfers, ambulation and stair negotiation due to weakness, and impaired balance and proprioception. Skilled PT is required to address the impairments and functional limitations listed below.    OBJECTIVE IMPAIRMENTS: Abnormal gait, decreased activity tolerance, decreased balance, decreased mobility, difficulty walking, decreased strength, impaired flexibility, and impaired tone.   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   REHAB POTENTIAL: Fair    CLINICAL DECISION MAKING: Evolving/moderate complexity  EVALUATION COMPLEXITY: Moderate   GOALS: Goals reviewed with patient? Yes  SHORT TERM GOALS: Target date: 03/02/24  Pt will demonstrate indep in HEP to facilitate carry-over of skilled  services and improve functional outcomes Goal status: IN PROGRESS; 03/01/24:  Reports compliance with HEP daily Goal status: MET  2.  Pt will be able to perform sit-to-stand with modified independence (use of rolling walker) Baseline: see above; 03/01/24:  Able to stand with no HHA or need for RW Goal status: MET  3.  Pt will be able to ambulate for > 200 ft with modified independence and use of rolling walker Baseline: see above; 03/01/24:  226 RW Goal status: MET  LONG TERM GOALS: Target date: 04/13/24  Pt will be able to stand up >5x in the 30 sec chair-to-stand test without assistance to demonstrate clinically significant improvement in balance and LE strength.    Baseline: see above; 03/01/24: 30 second chair to stand 6x no HHA  Goal status: MET  2.  Pt will increase by at least 120 ft in order to demonstrate clinically significant improvement in community ambulation Baseline: 64 ft; 03/01/24:  250ft Goal status: MET  3.  Pt will demonstrate increase in LE strength to 3+/5 to facilitate ease and safety in ambulation  Baseline: 2+/5 (see above) Goal status: IN PROGRESS  4.  Pt will be able to negotiate stairs (4-5 steps) with alternating feet and use of railings with CGA-SBA Baseline: to be determined; 03/01/24:  Able to negotiate stairs with step to pattern and need for 2HR Goal status: IN PROGRESS  5.  Pt will be able to ambulate with LRAD >300  ft Baseline: see above; 03/01/24:  289ft with RW Goal status:IN PROGRESS  PLAN:  PT FREQUENCY:  3x/week for the 1st month, re-assess needs on the 4th week  PT DURATION: 12 weeks  PLANNED INTERVENTIONS: 97164- PT Re-evaluation, 97110-Therapeutic exercises, 97530- Therapeutic activity, [id number]- Neuromuscular re-education, 97535- Self Care, 16606- Manual therapy, [phone]- Gait training, [phone]- Orthotic Fit/training, Patient/Family education, and Stair training.  PLAN FOR NEXT SESSION: Progress LE strengthening, balance,  and gait activities.  Gait with LRAD.    Becky Sax, LPTA/CLT; CBIS [phone]  12:17 PM, 03/08/24

## 2024-03-08 NOTE — Therapy (Addendum)
 OUTPATIENT PHYSICAL THERAPY WOUND TREATMENT  Patient Name: Scott Eaton MRN: [id number] DOB:September 28, 1976, 47 y.o., male Today's Date: 03/08/2024   PCP: none REFERRING PROVIDER: Isaias Cowman, MD  END OF SESSION:  PT End of Session - 03/08/24 0944     Visit Number 4    Number of Visits 6    Date for PT Re-Evaluation 05/03/24    Authorization Type [employer] Comm PPO (no auth, visit limit 60 combined with OT)    Authorization - Visit Number 17    Authorization - Number of Visits 60    Progress Note Due on Visit 6    PT Start Time 0809    PT Stop Time 0833    PT Time Calculation (min) 24 min             Past Medical History:  Diagnosis Date   Cervical radiculopathy    cervical stenosis   Complication of anesthesia    They had a problem putting pt to sleep   Obesity    Past Surgical History:  Procedure Laterality Date   LAMINECTOMY N/A 10/12/2019   Procedure: Cervical Three-Five Posterior Cervical Fusion with Laminectomy and Resection of Tumor;  Surgeon: Maeola Harman, MD;  Location: [facility] OR;  Service: Neurosurgery;  Laterality: N/A;  Cervical 3-4 Posterior cervical fusion with laminectomy and resection of tumor   TONSILLECTOMY     WISDOM TOOTH EXTRACTION     Patient Active Problem List   Diagnosis Date Noted   Status post cervical spinal fusion 10/12/2019   Elevated BP without diagnosis of hypertension 10/05/2019   COVID-19 virus infection 10/04/2019   Leukocytosis 10/04/2019   Metastatic cancer to spine (HCC) 10/02/2019    ONSET DATE: January 2025  REFERRING DIAG:  Diagnosis  L89.306 (ICD-10-CM) - Pressure-induced deep tissue damage of unspecified buttock    THERAPY DIAG:  Sacral wound, subsequent encounter Patient had surgery for his midback on 12/16/23 (see above) due to metastatic cancer to the spine (patient has rods and screws). Stayed in the hospital until 12/30/23. Went to the rehab hospital in [place] 12/30/23 till 01/17/24 where patient had PT/OT.    Rationale for Evaluation and Treatment: Rehabilitation     Wound Therapy - 03/08/24 0945     Subjective pt spouse states the foam tape stayed on a couple days.  Pt reports he's not having any pain at his wound currently and can tell it's getting better.    Patient and Family Stated Goals wound to heal    Prior Treatments care at rehab and self care at home    Evaluation and Treatment Procedures Explained to Patient/Family Yes    Evaluation and Treatment Procedures agreed to    Wound Properties Date First Assessed: 02/08/24 Time First Assessed: 0810 Wound Type: Other (Comment) , pressure  Location: Sacrum Location Orientation: Mid Wound Description (Comments): sacral Present on Admission: Yes   Wound Image Images linked: 1    Dressing Type Honey    Dressing Changed Changed    Dressing Status Old drainage    Dressing Change Frequency PRN    Site / Wound Assessment Clean;Yellow    % Wound base Red or Granulating 10%    % Wound base Yellow/Fibrinous Exudate 90%   100% before debridement   Peri-wound Assessment Intact;Erythema (blanchable)    Wound Length (cm) 0.7 cm    Wound Width (cm) 1.1 cm    Wound Depth (cm) 0.2 cm    Wound Volume (cm^3) 0.15 cm^3    Wound Surface  Area (cm^2) 0.77 cm^2    Margins Epibole (rolled edges)    Drainage Amount Scant    Drainage Description Serous    Treatment Cleansed;Debridement (Selective)    Selective Debridement (non-excisional) - Location wound bed and edges    Selective Debridement (non-excisional) - Tools Used Scalpel;Forceps;Scissors    Selective Debridement (non-excisional) - Tissue Removed slough    Wound Therapy - Clinical Statement see below    Wound Therapy - Functional Problem List difficult/painful to sit    Factors Delaying/Impairing Wound Healing Immobility;Multiple medical problems;Polypharmacy    Hydrotherapy Plan Debridement;Dressing change;Patient/family education    Wound Therapy - Frequency Other (comment)   1 x a week for  debridement; wife is taking care of wound   Wound Plan PT for debridement and to ensure a wound healing environment is maintained.    Dressing  medihoney, 2x2, medipore tape going vertical followed by foam tape horizontal .               PATIENT EDUCATION: Education details: keep pressure off, increase water and protein intake, decrease frequency of changing dressing unless it is soiled.  Person educated: Spouse Education method: Explanation and Verbal cues Education comprehension: verbalized understanding   HOME EXERCISE PROGRAM: N/A at this time.  PT receiving therapy for strengthening and has HEP for this.    GOALS: Goals reviewed with patient? No  SHORT TERM GOALS: Target date: 02/29/24  PT pain to be no greater than a 1 Baseline: Goal status: INITIAL  2.  Wound to be 100% granulated  Baseline:  Goal status: INITIAL  LONG TERM GOALS: Target date: 03/21/24  Pt to have no pain  Baseline:  Goal status: INITIAL  2.  PT wound to be healed  Baseline:  Goal status: INITIAL   ASSESSMENT:  CLINICAL IMPRESSION: Wound measured and photographed today (in media).  Continues to approximate, though slowly.  100% covered in adherent slough but able to debride away borders with scalpel and thin out central slough revealing 10% granulation.  Informed pt/spouse of santyl which may help to speed along the debridement process.  Spouse to check with insurance to see coverage and will decide at next wound appt if no further progress is made or slough continues to be adherent.  Spouse is doing great job with dressing changes and keeping wound clean in between therapy times. Continued with medi honey gel, medipore and foam tape bandaging. Pt will benefit from skilled PT for wound care to debride the wound bed as well as the epibole edges and create a healing environment.    OBJECTIVE IMPAIRMENTS: difficulty walking, pain, and decreased skin integrity  .   ACTIVITY LIMITATIONS: sitting,  bathing, toileting, and dressing  PERSONAL FACTORS: Fitness and 1-2 comorbidities: cancer, immobility polypharmacy  are also affecting patient's functional outcome.   REHAB POTENTIAL: Good  CLINICAL DECISION MAKING: Evolving/moderate complexity  EVALUATION COMPLEXITY: Moderate  PLAN: PT FREQUENCY: 1x/week  PT DURATION: 6 weeks  PLANNED INTERVENTIONS: 97535- Self Care and 16109- Wound care (first 20 sq cm)  PLAN FOR NEXT SESSION: debride, assess wound bed and change dressing as indicated.   Lurena Nida, PTA/CLT [facility] Ph: [phone] 03/08/2024, 12:10 PM

## 2024-03-10 ENCOUNTER — Ambulatory Visit (HOSPITAL_COMMUNITY)

## 2024-03-10 ENCOUNTER — Encounter (HOSPITAL_COMMUNITY): Payer: Self-pay

## 2024-03-10 DIAGNOSIS — R262 Difficulty in walking, not elsewhere classified: Secondary | ICD-10-CM | POA: Diagnosis not present

## 2024-03-10 DIAGNOSIS — R29898 Other symptoms and signs involving the musculoskeletal system: Secondary | ICD-10-CM

## 2024-03-10 NOTE — Therapy (Signed)
 OUTPATIENT PHYSICAL THERAPY Treatment  Patient Name: Scott Eaton MRN: [id number] DOB:03-28-1976, 48 y.o., male Today's Date: 03/10/2024  END OF SESSION:  PT End of Session - 03/10/24 1059     Visit Number 15    Number of Visits 28    Date for PT Re-Evaluation 05/03/24    Authorization Type [employer] P[employer] (no auth, visit limit 60 combined with OT)    Authorization - Visit Number 19    Authorization - Number of Visits 60    PT Start Time 1017    PT Stop Time 1101    PT Time Calculation (min) 44 min    Equipment Utilized During Treatment Gait belt    Activity Tolerance Patient limited by fatigue;Patient tolerated treatment well;Patient limited by pain    Behavior During Therapy WFL for tasks assessed/performed                  Past Medical History:  Diagnosis Date   Cervical radiculopathy    cervical stenosis   Complication of anesthesia    They had a problem putting pt to sleep   Obesity    Past Surgical History:  Procedure Laterality Date   LAMINECTOMY N/A 10/12/2019   Procedure: Cervical Three-Five Posterior Cervical Fusion with Laminectomy and Resection of Tumor;  Surgeon: Maeola Harman, MD;  Location: [facility] OR;  Service: Neurosurgery;  Laterality: N/A;  Cervical 3-4 Posterior cervical fusion with laminectomy and resection of tumor   TONSILLECTOMY     WISDOM TOOTH EXTRACTION     Patient Active Problem List   Diagnosis Date Noted   Status post cervical spinal fusion 10/12/2019   Elevated BP without diagnosis of hypertension 10/05/2019   COVID-19 virus infection 10/04/2019   Leukocytosis 10/04/2019   Metastatic cancer to spine (HCC) 10/02/2019    PCP: None on file  REFERRING PROVIDER: [name], PA  REFERRING DIAG: C79.51 (ICD-10-CM) - Metastatic cancer to spine [facility])  Rationale for Evaluation and Treatment: Rehabilitation  THERAPY DIAG:  Difficulty in walking, not elsewhere classified  Weakness of both lower extremities  ONSET DATE: on  12/16/23: - ARTHRODESIS, POSTERIOR OR POSTEROLATERAL TECHNIQUE, SINGLE interspace; THORACIC (WITH LATERAL TRANSVERSE TECHNIQUE, WHEN PERFORMED) POSTERIOR SEGMENTAL INSTRUMENTATION (EG, PEDICLE FIXATION, DUAL RODS WITH MULTIPLE HOOKS AND SUBLAMINAR WIRES); 7 TO 12 VERTEBRAL SEGMENTS (LIST IN ADDITION TO PRIMARY PROCEDURE)   - LAMINECTOMY FOR BIOPSY/EXCISION OF INTRASPINAL NEOPLASM; EXTRADURAL, THORACIC VERTEBRAL CORPECTOMY (VERTEBRAL BODY RESECTION), PARTIAL OR COMPLETE, FOR EXCISION OF INTRASPINAL LESION, SINGLE SEGMENT; EXTRADURAL, THORACIC BY THORACOLUMBAR APPROACH  - PERCU VERTEBROPLASTY, 1 VERTEBRAL BODY, UNI OR BILATERAL INJECTION, INCL ALL IMAGING GUIDANCE; EACH ADD CERVICOTHORACIC OR LUMBOSACRAL VERTEBRAL BODY (LIST IN ADDITION TO CODE FOR PRIMARY PROCEDURE) TRANSPEDICULAR APPROACH WITH DECOMPRESSION OF SPINAL CORD, EQUINA AND/OR NERVE ROOT(S) (EG, HERNIATED INTERVERTEBRAL DISC), SINGLE SEGMENT; THORACIC ALLOGRAFT, MORSELIZED, OR PLACEMENT OF OSTEOPROMOTIVE MATERIAL, FOR SPINE SURGERY ONLY (LIST IN ADDITION TO PRIMARY PROCEDURE)  - INSERTION INTERBODY BIOMECHANICAL DEVICE WITH INTEGRAL ANTERIOR INSTRUMENTATION, TO INTERVERTEBRAL DISC SPACE IN CONJUNCTION INTERBODY ARTHRODESIS, EACH INTERSPACE (LIST CODE FOR PRIMARY PROCEDURE)   SUBJECTIVE:  SUBJECTIVE STATEMENT: Reports Rt thigh pain while standing, pain scale 2/10 today.  Reports they noticed inflammation in hip today.  EVAL: Arrives to the clinic with c/o weakness on the legs (R>L). Also reports that the legs are tingly. Patient had surgery for his midback on 12/16/23 (see above) due to metastatic cancer to the spine (patient has rods and screws). Stayed in the hospital until 12/30/23. Went to the rehab hospital in [place] 12/30/23 till 01/17/24 where patient had  PT/OT. Patient is now able to do bed mobility (supine to sit) with some assistance on the leg every once in a while but patient can be independent most of the time. Patient is also now able to stand up on his own with rolling walker and some assistance from the wife.Patient can do bed to chair transfers by scooting. Patient also states that he is able to walk for around 70 ft with rolling walker and some stand by assist from caregivers. On his last day at the rehab hospital, patient was able to do stairs (4 steps x 4 rounds) in the rehab hospital with CGA. Patient is currently sleeping on the first floor of the house in a recliner because he cannot do his 13-steps stairs at home. Patient is now referred to outpatient PT evaluation and management.  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 No and Yes: NPRS scale: yes 2/10 Pain location: Rt thigh Pain description: sore Aggravating factors: supine position Relieving factors: pain medication, muscle relaxor  PRECAUTIONS: Other: spinal  RED FLAGS: None   WEIGHT BEARING RESTRICTIONS: No  FALLS:  Has patient fallen in last 6 months? No  LIVING ENVIRONMENT: Lives with: lives with their family Lives in: House/apartment Stairs: Yes: Internal: 13 steps; can reach both and External: 1 steps; none Has following equipment at home: Single point cane, Walker - 2 wheeled, Wheelchair (manual), shower chair, and Shower bench  OCCUPATION: on disability  PLOF: Needs assistance with ADLs, Needs assistance with gait, and Needs assistance with transfers  PATIENT GOALS: "to get back to my main function - climbing steps"  NEXT MD VISIT: unknown  OBJECTIVE:  Note: Objective measures were completed at Evaluation unless otherwise noted.  DIAGNOSTIC FINDINGS:  new thoracic lesions found in PET scan    PATIENT SURVEYS:  Neuro Quality of Life to be determined  COGNITION: Overall cognitive status: Within functional limits for tasks assessed     SENSATION: Light touch: impaired on B with R > L  MUSCLE LENGTH: Hamstrings: mild to moderate restriction  POSTURE: rounded shoulders and forward head  LOWER EXTREMITY ROM:    MMT Right eval Left eval  Hip flexion Around 20%  [facility]  Hip extension    Hip abduction    Hip adduction    Hip internal rotation    Hip external rotation    Knee flexion [facility]  Knee extension Around 80% WFL  Ankle dorsiflexion Around 50% [facility]  Ankle plantarflexion [facility]  Ankle inversion    Ankle eversion     (Blank rows = not tested)  LOWER EXTREMITY MMT:     Active  Right eval Left eval Right 03/01/24 Left 03/01/24  Hip flexion 2+ 4- 3- 4/5  Hip extension   Sidelying 2+ Sidelying 3/5  Hip abduction 3+ 4- 3+ 4-  Hip adduction  Hip internal rotation      Hip external rotation      Knee flexion 3+ 4- 3+ 4/5  Knee extension 2+ 4- 4- 4/5  Ankle dorsiflexion 2+ 4- 3+ 4/5  Ankle plantarflexion 3+ 4-    Ankle inversion      Ankle eversion       (Blank rows = not tested)  FUNCTIONAL TESTS:  2 minute walk test: 64 ft with CGA and rolling walker 30 sec chair stand test: 6x with CGA and UE assist on armrest/rolling walker.  03/01/24: 226 with RW CGA 30 sec chair stand test: 6x with no UE, RW infront though did not use  TRANSFERS  Bed to chair (and back): CGA to SBA with side scooting  Sit-to-stand: CGA with rolling walker GAIT: Distance walked: 64 ft Assistive device utilized: Walker - 2 wheeled Level of assistance: CGA and with w/c follow Comments: done during , decreased swing on R, R foot slap, decreased stance time R  TREATMENT DATE:  03/10/24:   Educated RICE techniques for pain and edema control Educated signs of infection: watch for redness and heat Abduction 2 x10 3" holds with RTB around thigh  Knee flexed pushing  back 3" 3 sets 3reps prior fatigue Hip flexor stretch 3x 30" on 12in step  LAQ: 2x 10 3" holds 3# Squat 10x front of chair  03/08/24: Began on Nustep following wound tx total of 15' (not included with time this session)  STS 10x eccentric control no HHA Heel raise 6 reps, increased Rt LE pain  Tandem stance 3x 30" last set on foam intermittent HHA Sidestep with GTB around thigh 5RT inside // bars Squat 10x Hip flexor stretch 3x 30" on 12in step  03/02/24: Nustep [place] L3 resistance with UE and LE SPM > 65 5' 3D hip excursion 5x each (lateral side bend, rotation, squat 2 sets front of chair)  Lunge onto 6in step height 5x each STS 5x eccentric control  Hamstring stretch seated 2x 30" Tandem stance 3x 30"  03/01/24: Reviewed goals with RW 226 CGA 30 sec chair stand test: 6x with no UE, RW infront though did not use MMT see above Stairs reciprocal pattern with 2 HR step to pattern 4RT (has 13 steps at home)  Squats front of chair 2 sets x 5 reps  Nustep [place] L3 resistance with UE and LE SPM > 65 5'   02/29/24: -STS no HHA 8 reps prior fatigue  -[employer] retro/forward walkout 3Pl 5x with RW for stability -Leg press 3Pl 2x 10 -Obstacle course with cone weaving then over obstacle 2RT -Hamstring curl 3# 8 reps Rt LE and 10 reps due to fatiuge  Attempted supine bridge- unable to tolerate  Sidelying: Clam RTB 2 set 10reps  02/23/24 -Nustep seat 10 level 4 x 5' dynamic warm up -Standing forward/backward walk outs w/ RW for support, ~57ft, 5x on plate 2, 5x on plate 3, pt with good control but varying step lengths and apttern -Stepping over orange obstacle in // bars, x10, 2nd set addition of 3 lb ankle weight shows improved consistency and control with step placement, v cues for decreasing UE support t/o -Side stepping in // bar, 3lb ankle weights, 6x down and back, v cues for decreasing UE support t/o  02/21/24 Nustep seat 10 level 4 x 5' dynamic warm  up Standing: Heel raises x 10 Heel raises on incline with 1 UE assist x 10 Toe raises on decline 2 x 10 with 1  UE assist 5# kettle bell mini squats 2 x 10 Cone taps 3 cones x 5 each leg with 1 to 2 UE assist as needed Red theraband sidestepping // bars down and back x 5 Red theraband hip extension 2 x 10     PATIENT EDUCATION:  Education details: Educated on the goals and course of rehab.  Person educated: Patient and Spouse Education method: Explanation Education comprehension: verbalized understanding  HOME EXERCISE PROGRAM: Access Code: [email or web address] URL: [email or web address] 02/29/24: Clam with RTB  02/11/2024 - Standing 3-Way Leg Reach with Resistance at Ankles and Counter Support  - 1 x daily - 7 x weekly - 2 sets - 10 reps - Seated Calf Stretch with Strap  - 2 x daily - 7 x weekly - 3 reps - 30 hold - Heel Toe Raises with Counter Support  - 2 x daily - 7 x weekly - 2 sets - 10 reps  Date: 02/03/2024 Prepared by: Emeline Gins Exercises - Beginner Bridge  - 2 x daily - 7 x weekly - 10 reps - 3 sec hold - Small Range Straight Leg Raise  - 2 x daily - 7 x weekly - 10 reps - Sitting to Supine Roll  - 7 x weekly - Seated Long Arc Quad  - 2 x daily - 7 x weekly - 10 reps - 5 sec hold - Sit to Stand Without Arm Support  - 2 x daily - 7 x weekly - 2 sets - 5 reps - Standing Hip Abduction  - 2 x daily - 7 x weekly - 10 reps  01/15/2024 - Mini Squat with Counter Support  - 2 x daily - 7 x weekly - 2 sets - 10 reps - 3 hold - Heel Raises with Counter Support  - 2 x daily - 7 x weekly - 2 sets - 10 reps     ASSESSMENT:  CLINICAL IMPRESSION: Reports of Rt thigh inflamed, educated on RICE techniques for edema and pain control as well as signs of infection with verbalized understanding.  Pt encouraged to continue with RW for pain control.  Session focus with LE strengthening with specific gluteal strengthening exercises.  Fatigue level at 6/10 with intermittent rest  breaks required between every 2 to 3 exercises.  Reports of relief following hip flexor stretch.  Progress note on 03/01/24:  Reviewed goals with the following findings:  Pt is progressing well towards goals with 3/3 STGs and 2/5 LTGs met.  Pt reports compliance with HEP daily.  Objective findings including improved cadence with and ability to complete sit to stand without HHA.  Pt continues to show LE weakness especially in hip musculature and decreased functional abilities.  Pt will  continue to benefit from skilled intervention PT with 3x/week for 4 more weeks.  Upon 4 more weeks we can decide to reduce frequency.  No wound care complete this week as wife reports it is looking good with home care and pt wishes to focus on strengthening this week.  Will assess need next week as they do wish for skilled intervention to assure if continues to heal.  Eval:  Patient is a 48 y.o. male who was seen today for physical therapy evaluation and treatment for s/p spinal surgery due to metastatic cancer to the spine. Patient's condition is further defined by difficulty with transfers, ambulation and stair negotiation due to weakness, and impaired balance and proprioception. Skilled PT is required to address the impairments and functional limitations listed below.  OBJECTIVE IMPAIRMENTS: Abnormal gait, decreased activity tolerance, decreased balance, decreased mobility, difficulty walking, decreased strength, impaired flexibility, and impaired tone.   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   REHAB POTENTIAL: Fair    CLINICAL DECISION MAKING: Evolving/moderate complexity  EVALUATION COMPLEXITY: Moderate   GOALS: Goals reviewed with patient? Yes  SHORT TERM GOALS: Target date: 03/02/24  Pt will demonstrate indep in HEP to facilitate carry-over of skilled services and improve functional outcomes Goal status: IN PROGRESS; 03/01/24:  Reports compliance with HEP daily Goal status: MET  2.  Pt will be able to perform sit-to-stand with modified independence (use of rolling walker) Baseline: see above; 03/01/24:  Able to stand with no HHA or need for RW Goal status: MET  3.  Pt will be able to ambulate for > 200 ft with modified independence and use of rolling walker Baseline: see above; 03/01/24:  226 RW Goal status: MET  LONG TERM GOALS: Target date: 04/13/24  Pt will be able to stand up >5x in the 30 sec chair-to-stand test without assistance to demonstrate clinically significant improvement in balance and LE strength.    Baseline: see above; 03/01/24: 30 second chair to stand 6x no HHA  Goal status: MET  2.  Pt will increase by at least 120 ft in order to demonstrate clinically significant improvement in community ambulation Baseline: 64 ft; 03/01/24:  259ft Goal status: MET  3.  Pt will demonstrate increase in LE strength to 3+/5 to facilitate ease and safety in ambulation  Baseline: 2+/5 (see above) Goal status: IN PROGRESS  4.  Pt will be able to negotiate stairs (4-5 steps) with alternating feet and use of railings with CGA-SBA Baseline: to be determined; 03/01/24:  Able to negotiate stairs with step to pattern and need for 2HR Goal status: IN PROGRESS  5.  Pt will be able to ambulate with LRAD >300  ft Baseline: see above; 03/01/24:  262ft with RW Goal status:IN PROGRESS  PLAN:  PT FREQUENCY:  3x/week for the 1st month, re-assess needs on the 4th week  PT DURATION: 12 weeks  PLANNED INTERVENTIONS: 97164- PT Re-evaluation,  97110-Therapeutic exercises, 97530- Therapeutic activity, [id number]- Neuromuscular re-education, 97535- Self Care, [address]- Manual therapy, [phone]- Gait training, [phone]- Orthotic Fit/training, Patient/Family education, and Stair training.  PLAN FOR NEXT SESSION: Progress LE strengthening, balance, and gait activities.  Gait with LRAD.    Becky Sax, LPTA/CLT; CBIS [phone]  4:13 PM, 03/10/24

## 2024-03-13 ENCOUNTER — Encounter (HOSPITAL_COMMUNITY)

## 2024-03-15 ENCOUNTER — Ambulatory Visit (HOSPITAL_COMMUNITY): Admitting: Physical Therapy

## 2024-03-15 ENCOUNTER — Encounter (HOSPITAL_COMMUNITY)

## 2024-03-17 ENCOUNTER — Encounter (HOSPITAL_COMMUNITY)

## 2024-03-20 ENCOUNTER — Encounter (HOSPITAL_COMMUNITY): Admitting: Physical Therapy

## 2024-03-22 ENCOUNTER — Encounter (HOSPITAL_COMMUNITY)

## 2024-03-22 ENCOUNTER — Ambulatory Visit (HOSPITAL_COMMUNITY): Admitting: Physical Therapy

## 2024-03-24 ENCOUNTER — Encounter (HOSPITAL_COMMUNITY)

## 2024-03-24 ENCOUNTER — Encounter (HOSPITAL_COMMUNITY): Payer: Self-pay

## 2024-03-24 NOTE — Therapy (Signed)
 [facility] Outpatient Rehabilitation at [facility] [address] [place], [place], [address] Phone: [phone]   Fax:  [phone]  Patient Details  Name: Hendry Speas MRN: [id number] Date of Birth: February 10, 1976 Referring Provider:  No ref. provider found  Encounter Date: 03/24/2024  Pt was called and wife picked up the phone. Informed me that Mr. Nims is currently in the hospital getting some fluid off of his lung and receiving lumbar injection. Pts wife said she will call back to schedule when pt returns home.   Lang Ada, PT, DPT [facility] Office: [phone] 9:57 AM, 03/24/24   [facility] Outpatient Rehabilitation at [facility] [address] [place], [place], [address] Phone: [phone]   Fax:  [phone]

## 2024-03-24 NOTE — Therapy (Deleted)
 [facility] Outpatient Rehabilitation at [facility] [address] [place], [place], [address] Phone: [phone]   Fax:  [phone]  Patient Details  Name: Zayd Bonet MRN: [id number] Date of Birth: [birth year] Referring Provider:  No ref. provider found  Encounter Date: 03/24/2024  Pt was called and wife picked up the phone. Informed me that Mr. Kluever is currently in the hospital getting some fluid off of his lung and receiving lumbar injection. Pts wife said she will call back to schedule when pt returns home.  Armond Bertin, PT, DPT [facility] Office: [phone] 9:55 AM, 03/24/24   [facility] Outpatient Rehabilitation at [facility] [address] [place], [place], [address] Phone: [phone]   Fax:  [phone]

## 2024-03-28 ENCOUNTER — Telehealth (HOSPITAL_COMMUNITY): Payer: Self-pay

## 2024-03-28 NOTE — Telephone Encounter (Signed)
 Called concerning recent hospitalization to check on patient, left message for call back.   Minor Amble, LPTA/CLT; Johnye Napoleon [phone]

## 2024-04-03 ENCOUNTER — Ambulatory Visit (HOSPITAL_COMMUNITY): Admitting: Physical Therapy

## 2024-04-05 ENCOUNTER — Telehealth (HOSPITAL_COMMUNITY): Payer: Self-pay

## 2024-04-05 NOTE — Telephone Encounter (Signed)
 Call back following message that wife has called. Attemped home and mobile wiht no answer, left message with contact information included.   Minor Amble, LPTA/CLT; Johnye Napoleon [phone]

## 2024-04-06 ENCOUNTER — Ambulatory Visit (HOSPITAL_COMMUNITY)

## 2024-04-06 ENCOUNTER — Encounter (HOSPITAL_COMMUNITY): Payer: Self-pay

## 2024-04-06 DIAGNOSIS — S31000D Unspecified open wound of lower back and pelvis without penetration into retroperitoneum, subsequent encounter: Secondary | ICD-10-CM | POA: Diagnosis present

## 2024-04-06 DIAGNOSIS — R29898 Other symptoms and signs involving the musculoskeletal system: Secondary | ICD-10-CM | POA: Insufficient documentation

## 2024-04-06 DIAGNOSIS — S31819S Unspecified open wound of right buttock, sequela: Secondary | ICD-10-CM | POA: Diagnosis present

## 2024-04-06 DIAGNOSIS — R262 Difficulty in walking, not elsewhere classified: Secondary | ICD-10-CM | POA: Diagnosis present

## 2024-04-06 DIAGNOSIS — S31829S Unspecified open wound of left buttock, sequela: Secondary | ICD-10-CM | POA: Diagnosis present

## 2024-04-06 NOTE — Therapy (Signed)
 OUTPATIENT PHYSICAL THERAPY RE-EVALUATION  Patient Name: Scott Eaton MRN: [id number] DOB:Apr 14, 1976, 48 y.o., male Today's Date: 04/06/2024  END OF SESSION:  PT End of Session - 04/06/24 1059     Visit Number 16    Number of Visits 28    Date for PT Re-Evaluation 05/03/24    Authorization Type [employer] Comm PPO (no auth, visit limit 60 combined with OT)    Authorization - Visit Number 20    Authorization - Number of Visits 60    Progress Note Due on Visit 32    PT Start Time 1022    PT Stop Time 1056    PT Time Calculation (min) 34 min    Equipment Utilized During Treatment Gait belt    Activity Tolerance Patient limited by fatigue;Patient tolerated treatment well;Patient limited by pain    Behavior During Therapy WFL for tasks assessed/performed                   Past Medical History:  Diagnosis Date   Cervical radiculopathy    cervical stenosis   Complication of anesthesia    They had a problem putting pt to sleep   Obesity    Past Surgical History:  Procedure Laterality Date   LAMINECTOMY N/A 10/12/2019   Procedure: Cervical Three-Five Posterior Cervical Fusion with Laminectomy and Resection of Tumor;  Surgeon: Manya Sells, MD;  Location: [facility] OR;  Service: Neurosurgery;  Laterality: N/A;  Cervical 3-4 Posterior cervical fusion with laminectomy and resection of tumor   TONSILLECTOMY     WISDOM TOOTH EXTRACTION     Patient Active Problem List   Diagnosis Date Noted   Status post cervical spinal fusion 10/12/2019   Elevated BP without diagnosis of hypertension 10/05/2019   COVID-19 virus infection 10/04/2019   Leukocytosis 10/04/2019   Metastatic cancer to spine (HCC) 10/02/2019    PCP: None on file  REFERRING PROVIDER: Apolinar Klippel, PA  REFERRING DIAG: C79.51 (ICD-10-CM) - Metastatic cancer to spine [facility])  Rationale for Evaluation and Treatment: Rehabilitation  THERAPY DIAG:  Difficulty in walking, not elsewhere classified  Weakness of both lower  extremities  ONSET DATE: on 12/16/23: - ARTHRODESIS, POSTERIOR OR POSTEROLATERAL TECHNIQUE, SINGLE interspace; THORACIC (WITH LATERAL TRANSVERSE TECHNIQUE, WHEN PERFORMED) POSTERIOR SEGMENTAL INSTRUMENTATION (EG, PEDICLE FIXATION, DUAL RODS WITH MULTIPLE HOOKS AND SUBLAMINAR WIRES); 7 TO 12 VERTEBRAL SEGMENTS (LIST IN ADDITION TO PRIMARY PROCEDURE)   - LAMINECTOMY FOR BIOPSY/EXCISION OF INTRASPINAL NEOPLASM; EXTRADURAL, THORACIC VERTEBRAL CORPECTOMY (VERTEBRAL BODY RESECTION), PARTIAL OR COMPLETE, FOR EXCISION OF INTRASPINAL LESION, SINGLE SEGMENT; EXTRADURAL, THORACIC BY THORACOLUMBAR APPROACH  - PERCU VERTEBROPLASTY, 1 VERTEBRAL BODY, UNI OR BILATERAL INJECTION, INCL ALL IMAGING GUIDANCE; EACH ADD CERVICOTHORACIC OR LUMBOSACRAL VERTEBRAL BODY (LIST IN ADDITION TO CODE FOR PRIMARY PROCEDURE) TRANSPEDICULAR APPROACH WITH DECOMPRESSION OF SPINAL CORD, EQUINA AND/OR NERVE ROOT(S) (EG, HERNIATED INTERVERTEBRAL DISC), SINGLE SEGMENT; THORACIC ALLOGRAFT, MORSELIZED, OR PLACEMENT OF OSTEOPROMOTIVE MATERIAL, FOR SPINE SURGERY ONLY (LIST IN ADDITION TO PRIMARY PROCEDURE)  - INSERTION INTERBODY BIOMECHANICAL DEVICE WITH INTEGRAL ANTERIOR INSTRUMENTATION, TO INTERVERTEBRAL DISC SPACE IN CONJUNCTION INTERBODY ARTHRODESIS, EACH INTERSPACE (LIST CODE FOR PRIMARY PROCEDURE)   SUBJECTIVE:  SUBJECTIVE STATEMENT: Pt reports that missing the past two weeks due to hospitalization and R knee pain. Still has drain on his lungs. No restrictions. Pt reports that he is having an off day.Scott Eaton   EVAL: Arrives to the clinic with c/o weakness on the legs (R>L). Also reports that the legs are tingly. Patient had surgery for his midback on 12/16/23 (see above) due to metastatic cancer to the spine (patient has rods and screws). Stayed in the hospital  until 12/30/23. Went to the rehab hospital in [place] 12/30/23 till 01/17/24 where patient had PT/OT. Patient is now able to do bed mobility (supine to sit) with some assistance on the leg every once in a while but patient can be independent most of the time. Patient is also now able to stand up on his own with rolling walker and some assistance from the wife.Patient can do bed to chair transfers by scooting. Patient also states that he is able to walk for around 70 ft with rolling walker and some stand by assist from caregivers. On his last day at the rehab hospital, patient was able to do stairs (4 steps x 4 rounds) in the rehab hospital with CGA. Patient is currently sleeping on the first floor of the house in a recliner because he cannot do his 13-steps stairs at home. Patient is now referred to outpatient PT evaluation and management.  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 No and Yes: NPRS scale: yes 2/10 Pain location: Rt thigh Pain description: sore Aggravating factors: supine position Relieving factors: pain medication, muscle relaxor  PRECAUTIONS: Other: spinal  RED FLAGS: None   WEIGHT BEARING RESTRICTIONS: No  FALLS:  Has patient fallen in last 6 months? No  LIVING ENVIRONMENT: Lives with: lives with their family Lives in: House/apartment Stairs: Yes: Internal: 13 steps; can reach both and External: 1 steps; none Has following equipment at home: Single point cane, Walker - 2 wheeled, Wheelchair (manual), shower chair, and Shower bench  OCCUPATION: on disability  PLOF: Needs assistance with ADLs, Needs assistance with gait, and Needs assistance with transfers  PATIENT GOALS: "to get back to my main function - climbing steps"  NEXT MD VISIT: unknown  OBJECTIVE:  Note: Objective measures were completed at Evaluation  unless otherwise noted.  DIAGNOSTIC FINDINGS:  new thoracic lesions found in PET scan   PATIENT SURVEYS:  Neuro Quality of Life to be determined  COGNITION: Overall cognitive status: Within functional limits for tasks assessed     SENSATION: Light touch: impaired on B with R > L  MUSCLE LENGTH: Hamstrings: mild to moderate restriction  POSTURE: rounded shoulders and forward head  LOWER EXTREMITY ROM:    MMT Right eval Left eval  Hip flexion Around 20%  [facility]  Hip extension    Hip abduction    Hip adduction    Hip internal rotation    Hip external rotation    Knee flexion [facility]  Knee extension Around 80% WFL  Ankle dorsiflexion Around 50% [facility]  Ankle plantarflexion [facility]  Ankle inversion    Ankle eversion     (Blank rows = not tested)  LOWER EXTREMITY MMT:     Active  Right eval Left eval Right 03/01/24 Left 03/01/24 Right 04/06/2024 Left 04/06/2024  Hip flexion 2+ 4- 3- 4/5 3+ 4-  Hip extension  Sidelying 2+ Sidelying 3/5    Hip abduction 3+ 4- 3+ 4- 4- 4-  Hip adduction        Hip internal rotation        Hip external rotation        Knee flexion 3+ 4- 3+ 4/5 3+ 4+  Knee extension 2+ 4- 4- 4/5 3+ 4+  Ankle dorsiflexion 2+ 4- 3+ 4/5 4- 4+  Ankle plantarflexion 3+ 4-      Ankle inversion        Ankle eversion         (Blank rows = not tested)  FUNCTIONAL TESTS:  2 minute walk test: 64 ft with CGA and rolling walker 30 sec chair stand test: 6x with CGA and UE assist on armrest/rolling walker.  03/01/24: 226 with RW CGA 30 sec chair stand test: 6x with no UE, RW infront though did not use  TRANSFERS  Bed to chair (and back): CGA to SBA with side scooting  Sit-to-stand: CGA with rolling walker GAIT: Distance walked: 64 ft Assistive device utilized: Walker - 2 wheeled Level of assistance: CGA and with w/c follow Comments: done during , decreased swing on R, R foot slap, decreased stance time R  TREATMENT DATE:  04/06/2024  -SpO2 90%  reading -Seated BP: 121/ 48 -30 Second Chair Stand Test: 5x  Norms:   Age 33-64 42-69 1-74 2-79 64-84 70-89 42-94  Women 15 15 14 13 12 11 9   Men 17 16 15 14 13 11 9    - : 12ft with 3 standing rest breaks -MMT  03/10/24:   Educated RICE techniques for pain and edema control Educated signs of infection: watch for redness and heat Abduction 2 x10 3" holds with RTB around thigh  Knee flexed pushing back 3" 3 sets 3reps prior fatigue Hip flexor stretch 3x 30" on 12in step  LAQ: 2x 10 3" holds 3# Squat 10x front of chair  03/08/24: Began on Nustep following wound tx total of 15' (not included with time this session)  STS 10x eccentric control no HHA Heel raise 6 reps, increased Rt LE pain  Tandem stance 3x 30" last set on foam intermittent HHA Sidestep with GTB around thigh 5RT inside // bars Squat 10x Hip flexor stretch 3x 30" on 12in step      PATIENT EDUCATION:  Education details: Educated on the goals and course of rehab.  Person educated: Patient and Spouse Education method: Explanation Education comprehension: verbalized understanding  HOME EXERCISE PROGRAM: Access Code: [email or web address] URL: [email or web address] 02/29/24: Clam with RTB  02/11/2024 - Standing 3-Way Leg Reach with Resistance at Ankles and Counter Support  - 1 x daily - 7 x weekly - 2 sets - 10 reps - Seated Calf Stretch with Strap  - 2 x daily - 7 x weekly - 3 reps - 30 hold - Heel Toe Raises with Counter Support  - 2 x daily - 7 x weekly - 2 sets - 10 reps  Date: 02/03/2024 Prepared by: Vickii Grand Exercises - Beginner Bridge  - 2 x daily - 7 x weekly - 10 reps - 3 sec hold - Small Range Straight Leg Raise  - 2 x daily - 7 x weekly - 10 reps - Sitting to Supine Roll  - 7 x weekly - Seated Long Arc Quad  - 2 x daily - 7 x weekly - 10 reps - 5 sec hold - Sit to Stand Without Arm Support  - 2 x daily - 7  x weekly - 2 sets - 5 reps - Standing Hip Abduction  - 2 x daily - 7 x weekly -  10 reps  01/15/2024 - Mini Squat with Counter Support  - 2 x daily - 7 x weekly - 2 sets - 10 reps - 3 hold - Heel Raises with Counter Support  - 2 x daily - 7 x weekly - 2 sets - 10 reps     ASSESSMENT:  CLINICAL IMPRESSION: Progress note:Reevaluation completed today due to recent hospitalization.  Patient showing minimal reduction.  Objective measures compared to previous treatment/progress note.  Patient noted with left lower extremity increased and manual muscle testing, 1 rep less compared to functional mobility functional test, significantly decreased 2-minute walk test due to right knee pain.  Extended PT POC for twice a week for 6 weeks. Pt will benefit from skilled Physical Therapy services to address deficits/limitations in order to improve functional and QOL.    Eval:  Patient is a 48 y.o. male who was seen today for physical therapy evaluation and treatment for s/p spinal surgery due to metastatic cancer to the spine. Patient's condition is further defined by difficulty with transfers, ambulation and stair negotiation due to weakness, and impaired balance and proprioception. Skilled PT is required to address the impairments and functional limitations listed below.    OBJECTIVE IMPAIRMENTS: Abnormal gait, decreased activity tolerance, decreased balance, decreased mobility, difficulty walking, decreased strength, impaired flexibility, and impaired tone.   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   REHAB POTENTIAL: Fair    CLINICAL DECISION MAKING:  Evolving/moderate complexity  EVALUATION COMPLEXITY: Moderate   GOALS: Goals reviewed with patient? Yes  SHORT TERM GOALS: Target date: 04/27/24  Pt will demonstrate indep in HEP to facilitate carry-over of skilled services and improve functional outcomes Goal status: IN PROGRESS; 03/01/24:  Reports compliance with HEP daily Goal status: MET  2.  Pt will be able to perform sit-to-stand with modified independence (use of rolling walker) Baseline: see above; 03/01/24:  Able to stand with no HHA or need for RW Goal status: MET  3.  Pt will be able to ambulate for > 200 ft with modified independence and use of rolling walker Baseline: see above; 03/01/24:  226 RW Goal status: MET  LONG TERM GOALS: Target date:05/18/24  Pt will be able to stand up >5x in the 30 sec chair-to-stand test without assistance to demonstrate clinically significant improvement in balance and LE strength.    Baseline: see above; 03/01/24: 30 second chair to stand 6x no HHA  Goal status: MET  2.  Pt will increase by at least 120 ft in order to demonstrate clinically significant improvement in community ambulation Baseline: 64 ft; 03/01/24:  251ft Goal status: MET  3.  Pt will demonstrate increase in LE strength to 3+/5 to facilitate ease and safety in ambulation  Baseline: 2+/5 (see above) Goal status: IN PROGRESS  4.  Pt will be able to negotiate stairs (4-5 steps) with alternating feet and use of railings with CGA-SBA Baseline: to be determined; 03/01/24:  Able to negotiate stairs with step to pattern and need for 2HR Goal  status: IN PROGRESS  5.  Pt will be able to ambulate with LRAD >300  ft Baseline: see above; 03/01/24:  220ft with RW Goal status:IN PROGRESS  PLAN:  PT FREQUENCY:  2x week --->3x/ week after 6 weeks   PT DURATION: 6 weeks  PLANNED INTERVENTIONS: [id number]- PT Re-evaluation, 97750- Physical Performance Testing, 97110-Therapeutic exercises, 97530- Therapeutic activity,  [id number]- Neuromuscular re-education, 97535- Self Care, 16109- Manual therapy, [id number]- Gait training, [phone]- Orthotic Initial, Patient/Family education, and Stair training.  PLAN FOR NEXT SESSION: Progress LE strengthening, balance, and gait activities.  Gait with LRAD.    Gatha Kaska PT, DPT [facility] office  11:02 AM, 04/06/24

## 2024-04-13 ENCOUNTER — Encounter (HOSPITAL_COMMUNITY)

## 2024-04-14 ENCOUNTER — Ambulatory Visit (HOSPITAL_COMMUNITY)

## 2024-04-14 ENCOUNTER — Ambulatory Visit (HOSPITAL_COMMUNITY): Admitting: Physical Therapy

## 2024-04-14 DIAGNOSIS — R262 Difficulty in walking, not elsewhere classified: Secondary | ICD-10-CM

## 2024-04-14 DIAGNOSIS — S31829S Unspecified open wound of left buttock, sequela: Secondary | ICD-10-CM

## 2024-04-14 DIAGNOSIS — R29898 Other symptoms and signs involving the musculoskeletal system: Secondary | ICD-10-CM

## 2024-04-14 DIAGNOSIS — S31819S Unspecified open wound of right buttock, sequela: Secondary | ICD-10-CM

## 2024-04-14 DIAGNOSIS — S31000D Unspecified open wound of lower back and pelvis without penetration into retroperitoneum, subsequent encounter: Secondary | ICD-10-CM

## 2024-04-14 NOTE — Therapy (Signed)
 OUTPATIENT PHYSICAL THERAPY WOUND TREATMENT/Progress   Patient Name: Scott Eaton MRN: [id number] DOB:07-Jul-1976, 48 y.o., male Today's Date: 04/14/2024  Progress Note Reporting Period 02/08/24 to 04/14/24  This is pt 4th wound treatment since evaluation.    See note below for Objective Data and Assessment of Progress/Goals.     PCP: none REFERRING PROVIDER: Cloretta Danes, MD/Jones, Jackie Mas, MD  END OF SESSION:  PT End of Session - 04/14/24 1110     Visit Number 5    Number of Visits 13    Date for PT Re-Evaluation 06/08/24    Authorization Type [employer] Comm PPO (no auth, visit limit 60 combined with OT)    Authorization - Visit Number 22    Authorization - Number of Visits 60    Progress Note Due on Visit 9    PT Start Time 1020    PT Stop Time 1100    PT Time Calculation (min) 40 min              Past Medical History:  Diagnosis Date   Cervical radiculopathy    cervical stenosis   Complication of anesthesia    They had a problem putting pt to sleep   Obesity    Past Surgical History:  Procedure Laterality Date   LAMINECTOMY N/A 10/12/2019   Procedure: Cervical Three-Five Posterior Cervical Fusion with Laminectomy and Resection of Tumor;  Surgeon: Manya Sells, MD;  Location: [facility] OR;  Service: Neurosurgery;  Laterality: N/A;  Cervical 3-4 Posterior cervical fusion with laminectomy and resection of tumor   TONSILLECTOMY     WISDOM TOOTH EXTRACTION     Patient Active Problem List   Diagnosis Date Noted   Status post cervical spinal fusion 10/12/2019   Elevated BP without diagnosis of hypertension 10/05/2019   COVID-19 virus infection 10/04/2019   Leukocytosis 10/04/2019   Metastatic cancer to spine (HCC) 10/02/2019    ONSET DATE: January 2025  REFERRING DIAG:  pressure wound of coccygeal region-new referral area  Diagnosis  L89.306 (ICD-10-CM) - Pressure-induced deep tissue damage of unspecified buttock initial referral still working on      THERAPY DIAGnosis :  pressure wound of coccygeal region-new referral area  Diagnosis  L89.306 (ICD-10-CM) - Pressure-induced deep tissue damage of unspecified buttock initial referral still working on   Back pain   Subjective:   Patient had surgery for his midback on 12/16/23 (see above) due to metastatic cancer to the spine (patient has rods and screws). Stayed in the hospital until 12/30/23. Went to the rehab hospital in [place] 12/30/23 till 01/17/24 where patient had PT/OT.  Pt was very debilitated and was bed bound causing the first wound in the sacral area.  Pt was doing well until he had a set back and was readmitted to the hospitalized due to fluid in his lungs. During this time his mobility declined and he was sitting in the wheelchair more and now a second wound has appeared.  He now has orders for wound care for both areas.   Pt is sleeping in his recliner.    Rationale for Evaluation and Treatment: Rehabilitation   Wound Therapy - 04/14/24 0001     Subjective Pt was put into the hospital due to fluid ln his lungs.  PT has been immobile and has developted two new open areas.  One on the medial aspect of his left and a second small area on the medial aspect of his Rt buttock.    Patient and Family Stated  Goals wound to heal    Prior Treatments care at rehab and self care at home    Pain Scale 0-10    Pain Score 2     Evaluation and Treatment Procedures Explained to Patient/Family Yes    Evaluation and Treatment Procedures agreed to    Wound Properties Date First Assessed: 04/14/24 Time First Assessed: 1100   Wound Image Images linked: 1    Dressing Type None    Dressing Changed New    Dressing Status None    Dressing Change Frequency PRN    Site / Wound Assessment Clean;Dry;Dusky;Purple;Pale    % Wound base Red or Granulating 15%    % Wound base Yellow/Fibrinous Exudate 85%    Peri-wound Assessment Erythema (blanchable)    Wound Length (cm) 4.4 cm    Wound Width (cm) --    inferior aspect 3 cm; superior 1.4  cm   Drainage Amount None    Treatment Cleansed;Debridement (Selective)    Wound Properties Date First Assessed: 02/08/24 Time First Assessed: 0810 Wound Type: Other (Comment) , pressure  Location: Sacrum Location Orientation: Mid Wound Description (Comments): sacral Present on Admission: Yes   Wound Image Images linked: 1    Dressing Type Honey    Dressing Changed Changed    Dressing Status Old drainage    Dressing Change Frequency PRN    Site / Wound Assessment Clean;Yellow    % Wound base Red or Granulating 30%    % Wound base Yellow/Fibrinous Exudate 70%    Peri-wound Assessment Intact;Erythema (blanchable)    Wound Length (cm) 0.8 cm    Wound Width (cm) 1.5 cm    Wound Depth (cm) 0.3 cm    Wound Volume (cm^3) 0.36 cm^3    Wound Surface Area (cm^2) 1.2 cm^2    Margins Epibole (rolled edges)    Drainage Amount Scant    Drainage Description Serous    Treatment Cleansed;Debridement (Selective)    Selective Debridement (non-excisional) - Location wound bed and edges    Selective Debridement (non-excisional) - Tools Used Scalpel;Forceps;Scissors    Selective Debridement (non-excisional) - Tissue Removed slough    Wound Therapy - Clinical Statement see below    Wound Therapy - Functional Problem List difficult/painful to sit    Factors Delaying/Impairing Wound Healing Immobility;Multiple medical problems;Polypharmacy    Hydrotherapy Plan Debridement;Dressing change;Patient/family education    Wound Therapy - Frequency Other (comment)   1 x a week for debridement; wife is taking care of wound   Wound Plan PT for debridement and to ensure a wound healing environment is maintained.    Dressing  medihoney, 2x2, medipore tape going vertical followed by foam tape horizontal .                PATIENT EDUCATION: Education details: keep pressure off, increase water and protein intake, Complete 10 glut sets per hour.  Stand every hour work up to 15  minutes per hour of standing.  Person educated: Spouse Education method: Explanation and Verbal cues Education comprehension: verbalized understanding   HOME EXERCISE PROGRAM: 10 glut sets every hour.    GOALS: Goals reviewed with patient? No  SHORT TERM GOALS: Target date: 02/29/24 revised target date of 05/12/24  PT pain to be no greater than a 1 Baseline: Goal status: INITIAL  2.  Wounds to be 100% granulated  Baseline:  Goal status: INITIAL  LONG TERM GOALS: Target date: 03/21/24; revised target date of 673/25  Pt to have no pain  Baseline:  Goal status: INITIAL  2.  PT wounds to be healed  Baseline:  Goal status: INITIAL   ASSESSMENT:  CLINICAL IMPRESSION:  Pt has not been back to therapy for wound care since 03/09/23 due to a hospitalization to drain his lungs at [facility].  PT has had increased knee pain and has been sitting more and now has a second pressure wound as described above.  There is also a small wound measuring 0.3cm x 0.4 cm on the medial aspect of his Rt buttock.  Mr. Fluellen will benefit from skilled PT to maintain a healing environment for this area.    IOBJECTIVE IMPAIRMENTS: difficulty walking, pain, and decreased skin integrity .   ACTIVITY LIMITATIONS: sitting, bathing, toileting, and dressing  PERSONAL FACTORS: Fitness and 1-2 comorbidities: cancer, immobility polypharmacy are also affecting patient's functional outcome.   REHAB POTENTIAL: Good  CLINICAL DECISION MAKING: Evolving/moderate complexity  EVALUATION COMPLEXITY: Moderate  PLAN: PT FREQUENCY: 1x/week  PT DURATION: 8 weeks  PLANNED INTERVENTIONS: 97535- Self Care and 19147- Wound care (first 20 sq cm)  PLAN FOR NEXT SESSION: debride, assess wound bed and change dressing as indicated.   Leodis Rainwater, PT CLT [phone] [facility] Ph: [phone] 04/14/2024, 1:56 PM

## 2024-04-14 NOTE — Therapy (Signed)
 OUTPATIENT PHYSICAL THERAPY TREATMENT  Patient Name: Scott Eaton MRN: [id number] DOB:[birth year], 48 y.o., male Today's Date: 04/14/2024  END OF SESSION:  PT End of Session - 04/14/24 1139     Visit Number 17    Number of Visits 28    Date for PT Re-Evaluation 06/08/24    Authorization Type [employer] Comm PPO (no auth, visit limit 60 combined with OT)    Authorization - Visit Number 23    Authorization - Number of Visits 60    PT Start Time 1145    PT Stop Time 1230    PT Time Calculation (min) 45 min    Equipment Utilized During Treatment Gait belt    Activity Tolerance Patient limited by fatigue;Patient tolerated treatment well;Patient limited by pain    Behavior During Therapy WFL for tasks assessed/performed                   Past Medical History:  Diagnosis Date   Cervical radiculopathy    cervical stenosis   Complication of anesthesia    They had a problem putting pt to sleep   Obesity    Past Surgical History:  Procedure Laterality Date   LAMINECTOMY N/A 10/12/2019   Procedure: Cervical Three-Five Posterior Cervical Fusion with Laminectomy and Resection of Tumor;  Surgeon: Manya Sells, MD;  Location: [facility] OR;  Service: Neurosurgery;  Laterality: N/A;  Cervical 3-4 Posterior cervical fusion with laminectomy and resection of tumor   TONSILLECTOMY     WISDOM TOOTH EXTRACTION     Patient Active Problem List   Diagnosis Date Noted   Status post cervical spinal fusion 10/12/2019   Elevated BP without diagnosis of hypertension 10/05/2019   COVID-19 virus infection 10/04/2019   Leukocytosis 10/04/2019   Metastatic cancer to spine (HCC) 10/02/2019    PCP: None on file  REFERRING PROVIDER: Apolinar Klippel, PA  REFERRING DIAG: C79.51 (ICD-10-CM) - Metastatic cancer to spine [facility])  Rationale for Evaluation and Treatment: Rehabilitation  THERAPY DIAG:  Difficulty in walking, not elsewhere classified  Weakness of both lower extremities  Other symptoms and  signs involving the musculoskeletal system  ONSET DATE: on 12/16/23: - ARTHRODESIS, POSTERIOR OR POSTEROLATERAL TECHNIQUE, SINGLE interspace; THORACIC (WITH LATERAL TRANSVERSE TECHNIQUE, WHEN PERFORMED) POSTERIOR SEGMENTAL INSTRUMENTATION (EG, PEDICLE FIXATION, DUAL RODS WITH MULTIPLE HOOKS AND SUBLAMINAR WIRES); 7 TO 12 VERTEBRAL SEGMENTS (LIST IN ADDITION TO PRIMARY PROCEDURE)   - LAMINECTOMY FOR BIOPSY/EXCISION OF INTRASPINAL NEOPLASM; EXTRADURAL, THORACIC VERTEBRAL CORPECTOMY (VERTEBRAL BODY RESECTION), PARTIAL OR COMPLETE, FOR EXCISION OF INTRASPINAL LESION, SINGLE SEGMENT; EXTRADURAL, THORACIC BY THORACOLUMBAR APPROACH  - PERCU VERTEBROPLASTY, 1 VERTEBRAL BODY, UNI OR BILATERAL INJECTION, INCL ALL IMAGING GUIDANCE; EACH ADD CERVICOTHORACIC OR LUMBOSACRAL VERTEBRAL BODY (LIST IN ADDITION TO CODE FOR PRIMARY PROCEDURE) TRANSPEDICULAR APPROACH WITH DECOMPRESSION OF SPINAL CORD, EQUINA AND/OR NERVE ROOT(S) (EG, HERNIATED INTERVERTEBRAL DISC), SINGLE SEGMENT; THORACIC ALLOGRAFT, MORSELIZED, OR PLACEMENT OF OSTEOPROMOTIVE MATERIAL, FOR SPINE SURGERY ONLY (LIST IN ADDITION TO PRIMARY PROCEDURE)  - INSERTION INTERBODY BIOMECHANICAL DEVICE WITH INTEGRAL ANTERIOR INSTRUMENTATION, TO INTERVERTEBRAL DISC SPACE IN CONJUNCTION INTERBODY ARTHRODESIS, EACH INTERSPACE (LIST CODE FOR PRIMARY PROCEDURE)   SUBJECTIVE:  SUBJECTIVE STATEMENT: Patient received a cortisone shot in the right knee yesterday.   Has a bad bed sore per his report; slight pain in legs 1/10.     EVAL: Arrives to the clinic with c/o weakness on the legs (R>L). Also reports that the legs are tingly. Patient had surgery for his midback on 12/16/23 (see above) due to metastatic cancer to the spine (patient has rods and screws). Stayed in the hospital until 12/30/23.  Went to the rehab hospital in [place] 12/30/23 till 01/17/24 where patient had PT/OT. Patient is now able to do bed mobility (supine to sit) with some assistance on the leg every once in a while but patient can be independent most of the time. Patient is also now able to stand up on his own with rolling walker and some assistance from the wife.Patient can do bed to chair transfers by scooting. Patient also states that he is able to walk for around 70 ft with rolling walker and some stand by assist from caregivers. On his last day at the rehab hospital, patient was able to do stairs (4 steps x 4 rounds) in the rehab hospital with CGA. Patient is currently sleeping on the first floor of the house in a recliner because he cannot do his 13-steps stairs at home. Patient is now referred to outpatient PT evaluation and management.  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 No and Yes: NPRS scale: yes 2/10 Pain location: Rt thigh Pain description: sore Aggravating factors: supine position Relieving factors: pain medication, muscle relaxor  PRECAUTIONS: Other: spinal  RED FLAGS: None   WEIGHT BEARING RESTRICTIONS: No  FALLS:  Has patient fallen in last 6 months? No  LIVING ENVIRONMENT: Lives with: lives with their family Lives in: House/apartment Stairs: Yes: Internal: 13 steps; can reach both and External: 1 steps; none Has following equipment at home: Single point cane, Walker - 2 wheeled, Wheelchair (manual), shower chair, and Shower bench  OCCUPATION: on disability  PLOF: Needs assistance with ADLs, Needs assistance with gait, and Needs assistance with transfers  PATIENT GOALS: "to get back to my main function - climbing steps"  NEXT MD VISIT: unknown  OBJECTIVE:  Note: Objective measures were completed at Evaluation unless otherwise  noted.  DIAGNOSTIC FINDINGS:  new thoracic lesions found in PET scan   PATIENT SURVEYS:  Neuro Quality of Life to be determined  COGNITION: Overall cognitive status: Within functional limits for tasks assessed     SENSATION: Light touch: impaired on B with R > L  MUSCLE LENGTH: Hamstrings: mild to moderate restriction  POSTURE: rounded shoulders and forward head  LOWER EXTREMITY ROM:    MMT Right eval Left eval  Hip flexion Around 20%  [facility]  Hip extension    Hip abduction    Hip adduction    Hip internal rotation    Hip external rotation    Knee flexion [facility]  Knee extension Around 80% WFL  Ankle dorsiflexion Around 50% [facility]  Ankle plantarflexion [facility]  Ankle inversion    Ankle eversion     (Blank rows = not tested)  LOWER EXTREMITY MMT:     Active  Right eval Left eval Right 03/01/24 Left 03/01/24 Right 04/06/2024 Left 04/06/2024  Hip flexion 2+ 4- 3- 4/5 3+ 4-  Hip extension   Sidelying 2+ Sidelying  3/5    Hip abduction 3+ 4- 3+ 4- 4- 4-  Hip adduction        Hip internal rotation        Hip external rotation        Knee flexion 3+ 4- 3+ 4/5 3+ 4+  Knee extension 2+ 4- 4- 4/5 3+ 4+  Ankle dorsiflexion 2+ 4- 3+ 4/5 4- 4+  Ankle plantarflexion 3+ 4-      Ankle inversion        Ankle eversion         (Blank rows = not tested)  FUNCTIONAL TESTS:  2 minute walk test: 64 ft with CGA and rolling walker 30 sec chair stand test: 6x with CGA and UE assist on armrest/rolling walker.  03/01/24: 226 with RW CGA 30 sec chair stand test: 6x with no UE, RW infront though did not use  TRANSFERS  Bed to chair (and back): CGA to SBA with side scooting  Sit-to-stand: CGA with rolling walker GAIT: Distance walked: 64 ft Assistive device utilized: Walker - 2 wheeled Level of assistance: CGA and with w/c follow Comments: done during , decreased swing on R, R foot slap, decreased stance time R  TREATMENT DATE:  04/14/24 Nustep seat 11 arms 7 level 3 x  10' Seated in [facility] Hip adduction with ball 5" hold 2 x 5 Calf stretch 10" hold x 10 with strap Hip Abduction with red theraband 2 x 10 Hip flexion with red theraband 2 x 10   04/06/2024  -SpO2 90% reading -Seated BP: 121/ 48 -30 Second Chair Stand Test: 5x  Norms:   Age 41-64 30-69 36-74 75-79 76-84 85-89 90-94  Women 15 15 14 13 12 11 9   Men 17 16 15 14 13 11 9    - : 50ft with 3 standing rest breaks -MMT  03/10/24:   Educated RICE techniques for pain and edema control Educated signs of infection: watch for redness and heat Abduction 2 x10 3" holds with RTB around thigh  Knee flexed pushing back 3" 3 sets 3reps prior fatigue Hip flexor stretch 3x 30" on 12in step  LAQ: 2x 10 3" holds 3# Squat 10x front of chair  03/08/24: Began on Nustep following wound tx total of 15' (not included with time this session)  STS 10x eccentric control no HHA Heel raise 6 reps, increased Rt LE pain  Tandem stance 3x 30" last set on foam intermittent HHA Sidestep with GTB around thigh 5RT inside // bars Squat 10x Hip flexor stretch 3x 30" on 12in step      PATIENT EDUCATION:  Education details: Educated on the goals and course of rehab.  Person educated: Patient and Spouse Education method: Explanation Education comprehension: verbalized understanding  HOME EXERCISE PROGRAM: Access Code: [email or web address] URL: [email or web address] 02/29/24: Clam with RTB  02/11/2024 - Standing 3-Way Leg Reach with Resistance at Ankles and Counter Support  - 1 x daily - 7 x weekly - 2 sets - 10 reps - Seated Calf Stretch with Strap  - 2 x daily - 7 x weekly - 3 reps - 30 hold - Heel Toe Raises with Counter Support  - 2 x daily - 7 x weekly - 2 sets - 10 reps  Date: 02/03/2024 Prepared by: Vickii Grand Exercises - Beginner Bridge  - 2 x daily - 7 x weekly - 10 reps - 3 sec hold - Small Range Straight Leg Raise  - 2 x daily - 7 x weekly - 10 reps -  Sitting to Supine Roll  - 7 x weekly -  Seated Long Arc Quad  - 2 x daily - 7 x weekly - 10 reps - 5 sec hold - Sit to Stand Without Arm Support  - 2 x daily - 7 x weekly - 2 sets - 5 reps - Standing Hip Abduction  - 2 x daily - 7 x weekly - 10 reps  01/15/2024 - Mini Squat with Counter Support  - 2 x daily - 7 x weekly - 2 sets - 10 reps - 3 hold - Heel Raises with Counter Support  - 2 x daily - 7 x weekly - 2 sets - 10 reps     ASSESSMENT:  CLINICAL IMPRESSION: Patient with recent injection so focus on non weightbearing exercise and strengthening today.  Patient with moderate fatigue throughout treatment taking frequent rests as needed.  He does have some pain with hip flexion shooting from hip to knee.  Otherwise no significant report of pain.will resume weight bearing exercise as appropriate next visit.  Patient will benefit from continued skilled therapy services to address deficits and promote return to optimal function.       Eval:  Patient is a 48 y.o. male who was seen today for physical therapy evaluation and treatment for s/p spinal surgery due to metastatic cancer to the spine. Patient's condition is further defined by difficulty with transfers, ambulation and stair negotiation due to weakness, and impaired balance and proprioception. Skilled PT is required to address the impairments and functional limitations listed below.    OBJECTIVE IMPAIRMENTS: Abnormal gait, decreased activity tolerance, decreased balance, decreased mobility, difficulty walking, decreased strength, impaired flexibility, and impaired tone.   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   REHAB POTENTIAL: Fair    CLINICAL DECISION MAKING: Evolving/moderate complexity  EVALUATION COMPLEXITY: Moderate   GOALS: Goals reviewed with patient? Yes  SHORT TERM GOALS: Target date: 04/27/24  Pt will demonstrate indep in HEP to facilitate carry-over of skilled services and improve functional outcomes Goal status: IN PROGRESS; 03/01/24:  Reports compliance with HEP daily Goal status: MET  2.  Pt will be able to perform sit-to-stand with modified independence (use of rolling walker) Baseline: see above; 03/01/24:  Able to stand with no HHA or need for RW Goal status: MET  3.  Pt will be able to ambulate for > 200 ft with modified independence and use of rolling walker Baseline: see above; 03/01/24:  226 RW Goal status: MET  LONG TERM GOALS: Target date:05/18/24  Pt will be able to stand up >5x in the 30 sec chair-to-stand test without assistance to demonstrate clinically significant improvement in balance and LE strength.    Baseline: see above; 03/01/24: 30 second chair to stand 6x no HHA  Goal status: MET  2.  Pt will increase by at least 120 ft in order to demonstrate clinically significant improvement in community ambulation Baseline: 64 ft; 03/01/24:  24ft Goal status: MET  3.  Pt will demonstrate increase in LE strength to 3+/5 to facilitate ease and safety in ambulation  Baseline: 2+/5 (see above) Goal status: IN PROGRESS  4.  Pt will be able to negotiate stairs (4-5 steps) with alternating feet and use of railings with CGA-SBA Baseline: to be determined; 03/01/24:  Able to negotiate stairs with step to pattern and need for 2HR Goal status: IN PROGRESS  5.  Pt will be able to ambulate with LRAD >300  ft Baseline: see above; 03/01/24:  243ft with RW Goal status:IN PROGRESS  PLAN:  PT FREQUENCY: 2x week --->3x/ week after 6 weeks   PT DURATION: 6 weeks  PLANNED INTERVENTIONS:  97164- PT Re-evaluation, 97750- Physical Performance Testing, 97110-Therapeutic exercises, 97530- Therapeutic activity, [id number]- Neuromuscular re-education, 97535- Self Care, 16109- Manual therapy, [id number]- Gait training, [phone]- Orthotic Initial, Patient/Family education, and Stair training.  PLAN FOR NEXT SESSION: Progress LE strengthening, balance, and gait activities.  Gait with LRAD.   12:27 PM, 04/14/24 Emelyn Roen Small Chante Mayson MPT [place] [facility] [place] [phone]

## 2024-04-18 ENCOUNTER — Encounter (HOSPITAL_COMMUNITY): Admitting: Physical Therapy

## 2024-04-20 ENCOUNTER — Encounter (HOSPITAL_COMMUNITY): Payer: Self-pay

## 2024-04-20 ENCOUNTER — Ambulatory Visit (HOSPITAL_COMMUNITY): Admitting: Physical Therapy

## 2024-04-20 ENCOUNTER — Ambulatory Visit (HOSPITAL_COMMUNITY)

## 2024-04-20 DIAGNOSIS — R262 Difficulty in walking, not elsewhere classified: Secondary | ICD-10-CM | POA: Diagnosis not present

## 2024-04-20 DIAGNOSIS — R29898 Other symptoms and signs involving the musculoskeletal system: Secondary | ICD-10-CM

## 2024-04-20 DIAGNOSIS — S31829S Unspecified open wound of left buttock, sequela: Secondary | ICD-10-CM

## 2024-04-20 NOTE — Therapy (Signed)
 OUTPATIENT PHYSICAL THERAPY WOUND TREATMENT/Progress   Patient Name: Scott Eaton MRN: [id number] DOB:1976-08-02, 48 y.o., male Today's Date: 04/20/2024  Progress Note Reporting Period 02/08/24 to 04/14/24  This is pt 4th wound treatment since evaluation.    See note below for Objective Data and Assessment of Progress/Goals.     PCP: none REFERRING PROVIDER: Cloretta Danes, MD/Jones, Jackie Mas, MD  END OF SESSION:  PT End of Session - 04/20/24 1444     Visit Number 6    Number of Visits 13    Date for PT Re-Evaluation 06/08/24    Authorization Type [employer] Comm PPO (no auth, visit limit 60 combined with OT)    Authorization - Visit Number 25   wound plus ortho   Authorization - Number of Visits 60    Progress Note Due on Visit 9    PT Start Time 1230    PT Stop Time 1306    PT Time Calculation (min) 36 min    Activity Tolerance Patient tolerated treatment well    Behavior During Therapy WFL for tasks assessed/performed              Past Medical History:  Diagnosis Date   Cervical radiculopathy    cervical stenosis   Complication of anesthesia    They had a problem putting pt to sleep   Obesity    Past Surgical History:  Procedure Laterality Date   LAMINECTOMY N/A 10/12/2019   Procedure: Cervical Three-Five Posterior Cervical Fusion with Laminectomy and Resection of Tumor;  Surgeon: Manya Sells, MD;  Location: [facility] OR;  Service: Neurosurgery;  Laterality: N/A;  Cervical 3-4 Posterior cervical fusion with laminectomy and resection of tumor   TONSILLECTOMY     WISDOM TOOTH EXTRACTION     Patient Active Problem List   Diagnosis Date Noted   Status post cervical spinal fusion 10/12/2019   Elevated BP without diagnosis of hypertension 10/05/2019   COVID-19 virus infection 10/04/2019   Leukocytosis 10/04/2019   Metastatic cancer to spine (HCC) 10/02/2019    ONSET DATE: January 2025  REFERRING DIAG:  pressure wound of coccygeal region-new referral area   Diagnosis  L89.306 (ICD-10-CM) - Pressure-induced deep tissue damage of unspecified buttock initial referral still working on     THERAPY DIAGnosis :  pressure wound of coccygeal region-new referral area  Diagnosis  L89.306 (ICD-10-CM) - Pressure-induced deep tissue damage of unspecified buttock initial referral still working on   Back pain   Subjective:   Patient had surgery for his midback on 12/16/23 (see above) due to metastatic cancer to the spine (patient has rods and screws). Stayed in the hospital until 12/30/23. Went to the rehab hospital in [place] 12/30/23 till 01/17/24 where patient had PT/OT.  Pt was very debilitated and was bed bound causing the first wound in the sacral area.  Pt was doing well until he had a set back and was readmitted to the hospitalized due to fluid in his lungs. During this time his mobility declined and he was sitting in the wheelchair more and now a second wound has appeared.  He now has orders for wound care for both areas.   Pt is sleeping in his recliner.    Rationale for Evaluation and Treatment: Rehabilitation    04/20/24 0001  Subjective Assessment  Subjective PT wound has progressed rapidly  Patient and Family Stated Goals wound to heal  Prior Treatments care at rehab and self care at home  Pain Assessment  Pain Scale 0-10  Pain Score 2  Evaluation and Treatment  Evaluation and Treatment Procedures Explained to Patient/Family Yes  Evaluation and Treatment Procedures agreed to  Wound / Incision (Open or Dehisced) 04/14/24 Buttocks Left  Date First Assessed/Time First Assessed: 04/14/24 1100   Location: Buttocks  Location Orientation: Left  Present on Admission: Yes  Wound Image   Dressing Type None  Dressing Status None  Dressing Change Frequency PRN  Site / Wound Assessment Black  % Wound base Black/Eschar 100%  Peri-wound Assessment Erythema (blanchable)  Wound Length (cm) 5 cm  Wound Width (cm) 3 cm  Wound Surface Area (cm^2) 15 cm^2   Drainage Amount None  Treatment Cleansed;Debridement (Selective)  Wound / Incision (Open or Dehisced) 02/08/24 Other (Comment) Sacrum Mid sacral  Date First Assessed/Time First Assessed: 02/08/24 0810   Wound Type: (c) Other (Comment)  Location: Sacrum  Location Orientation: Mid  Wound Description (Comments): sacral  Present on Admission: Yes  Wound Image   Dressing Type Honey  Dressing Changed Changed  Dressing Status Old drainage  Dressing Change Frequency PRN  Site / Wound Assessment Clean;Yellow  % Wound base Red or Granulating 60%  % Wound base Yellow/Fibrinous Exudate 40%  Peri-wound Assessment Intact;Erythema (blanchable)  Margins Epibole (rolled edges)  Drainage Description Serous  Treatment Cleansed  Selective Debridement (non-excisional)  Selective Debridement (non-excisional) - Location wound bed and edges  Selective Debridement (non-excisional) - Tools Used Scalpel;Forceps;Scissors  Selective Debridement (non-excisional) - Tissue Removed slough  Wound Therapy - Assess/Plan/Recommendations  Wound Therapy - Clinical Statement see below  Wound Therapy - Functional Problem List difficult/painful to sit  Factors Delaying/Impairing Wound Healing Immobility;Multiple medical problems;Polypharmacy  Hydrotherapy Plan Debridement;Dressing change;Patient/family education  Wound Therapy - Frequency Other (comment) (1 x a week for debridement; wife is taking care of wound)  Wound Plan PT for debridement and to ensure a wound healing environment is maintained.  Wound Therapy  Dressing  medihoney to wounds followed by gauze seccured by using sacral foam pad cut and reinforced with medipore and foam tape .          PATIENT EDUCATION: Education details: keep pressure off, increase water and protein intake, Complete 10 glut sets per hour.  Stand every hour work up to 15 minutes per hour of standing.  Person educated: Spouse Education method: Explanation and Verbal cues Education  comprehension: verbalized understanding   HOME EXERCISE PROGRAM: 10 glut sets every hour.    GOALS: Goals reviewed with patient? No  SHORT TERM GOALS: Target date: 02/29/24 revised target date of 05/12/24  PT pain to be no greater than a 1 Baseline: Goal status: IN PROGRESS  2.  Wounds to be 100% granulated  Baseline:  Goal status: IN PROGRESS  LONG TERM GOALS: Target date: 03/21/24; revised target date of 673/25  Pt to have no pain  Baseline:  Goal status: IN PROGRESS  2.  PT wounds to be healed  Baseline:  Goal status: IN PROGRESS   ASSESSMENT:  CLINICAL IMPRESSION:  Therapist stressed the importance of keeping wt off of buttock area, explained that black is necrotic and tissue death.  Therapist bladed eschar area as this is very adherent at this time.  PT most recent wound on Lt buttock is deteriorating rapidly. If wound does not look better next week will increase to twice a week vs once a week.  PT original superior wound is healing nicely.  Mr. Remedios will benefit from skilled PT to maintain a healing environment for this area.  IOBJECTIVE IMPAIRMENTS: difficulty walking, pain, and decreased skin integrity .   ACTIVITY LIMITATIONS: sitting, bathing, toileting, and dressing  PERSONAL FACTORS: Fitness and 1-2 comorbidities: cancer, immobility polypharmacy are also affecting patient's functional outcome.   REHAB POTENTIAL: Good  CLINICAL DECISION MAKING: Evolving/moderate complexity  EVALUATION COMPLEXITY: Moderate  PLAN: PT FREQUENCY: 1x/week  PT DURATION: 8 weeks  PLANNED INTERVENTIONS: 97535- Self Care and 16109- Wound care (first 20 sq cm)  PLAN FOR NEXT SESSION: debride, assess wound bed and change dressing as indicated.   Leodis Rainwater, PT CLT [phone] [facility] Outpatient Rehabilitation [facility] Ph: [phone] 04/20/2024, 3:05 PM

## 2024-04-20 NOTE — Therapy (Signed)
 OUTPATIENT PHYSICAL THERAPY TREATMENT  Patient Name: Scott Eaton MRN: [id number] DOB:Jan 07, 1976, 48 y.o., male Today's Date: 04/20/2024  END OF SESSION:  PT End of Session - 04/20/24 1158     Visit Number 18    Number of Visits 28    Date for PT Re-Evaluation 06/08/24    Authorization Type [employer] P[employer] (no auth, visit limit 60 combined with OT)    Authorization - Visit Number 24    Authorization - Number of Visits 60    Progress Note Due on Visit 9    PT Start Time 1155 (P)     PT Stop Time 1230 (P)     PT Time Calculation (min) 35 min (P)     Equipment Utilized During Treatment Gait belt (P)     Activity Tolerance Patient limited by fatigue;Patient tolerated treatment well;Patient limited by pain (P)     Behavior During Therapy WFL for tasks assessed/performed (P)                     Past Medical History:  Diagnosis Date   Cervical radiculopathy    cervical stenosis   Complication of anesthesia    They had a problem putting pt to sleep   Obesity    Past Surgical History:  Procedure Laterality Date   LAMINECTOMY N/A 10/12/2019   Procedure: Cervical Three-Five Posterior Cervical Fusion with Laminectomy and Resection of Tumor;  Surgeon: Manya Sells, MD;  Location: [facility] OR;  Service: Neurosurgery;  Laterality: N/A;  Cervical 3-4 Posterior cervical fusion with laminectomy and resection of tumor   TONSILLECTOMY     WISDOM TOOTH EXTRACTION     Patient Active Problem List   Diagnosis Date Noted   Status post cervical spinal fusion 10/12/2019   Elevated BP without diagnosis of hypertension 10/05/2019   COVID-19 virus infection 10/04/2019   Leukocytosis 10/04/2019   Metastatic cancer to spine (HCC) 10/02/2019    PCP: None on file  REFERRING PROVIDER: Apolinar Klippel, PA  REFERRING DIAG: C79.51 (ICD-10-CM) - Metastatic cancer to spine [facility])  Rationale for Evaluation and Treatment: Rehabilitation  THERAPY DIAG:  Difficulty in walking, not elsewhere  classified  Weakness of both lower extremities  Other symptoms and signs involving the musculoskeletal system  ONSET DATE: on 12/16/23: - ARTHRODESIS, POSTERIOR OR POSTEROLATERAL TECHNIQUE, SINGLE interspace; THORACIC (WITH LATERAL TRANSVERSE TECHNIQUE, WHEN PERFORMED) POSTERIOR SEGMENTAL INSTRUMENTATION (EG, PEDICLE FIXATION, DUAL RODS WITH MULTIPLE HOOKS AND SUBLAMINAR WIRES); 7 TO 12 VERTEBRAL SEGMENTS (LIST IN ADDITION TO PRIMARY PROCEDURE)   - LAMINECTOMY FOR BIOPSY/EXCISION OF INTRASPINAL NEOPLASM; EXTRADURAL, THORACIC VERTEBRAL CORPECTOMY (VERTEBRAL BODY RESECTION), PARTIAL OR COMPLETE, FOR EXCISION OF INTRASPINAL LESION, SINGLE SEGMENT; EXTRADURAL, THORACIC BY THORACOLUMBAR APPROACH  - PERCU VERTEBROPLASTY, 1 VERTEBRAL BODY, UNI OR BILATERAL INJECTION, INCL ALL IMAGING GUIDANCE; EACH ADD CERVICOTHORACIC OR LUMBOSACRAL VERTEBRAL BODY (LIST IN ADDITION TO CODE FOR PRIMARY PROCEDURE) TRANSPEDICULAR APPROACH WITH DECOMPRESSION OF SPINAL CORD, EQUINA AND/OR NERVE ROOT(S) (EG, HERNIATED INTERVERTEBRAL DISC), SINGLE SEGMENT; THORACIC ALLOGRAFT, MORSELIZED, OR PLACEMENT OF OSTEOPROMOTIVE MATERIAL, FOR SPINE SURGERY ONLY (LIST IN ADDITION TO PRIMARY PROCEDURE)  - INSERTION INTERBODY BIOMECHANICAL DEVICE WITH INTEGRAL ANTERIOR INSTRUMENTATION, TO INTERVERTEBRAL DISC SPACE IN CONJUNCTION INTERBODY ARTHRODESIS, EACH INTERSPACE (LIST CODE FOR PRIMARY PROCEDURE)   SUBJECTIVE:  SUBJECTIVE STATEMENT: Pt states the shot has helped. Pt and pts spouse reports improvements being seen. Pt reports very busy couple of days and states knee can start to hurt in the afternoon. Neurologist appointment about knee 19th.  EVAL: Arrives to the clinic with c/o weakness on the legs (R>L). Also reports that the legs are tingly. Patient had  surgery for his midback on 12/16/23 (see above) due to metastatic cancer to the spine (patient has rods and screws). Stayed in the hospital until 12/30/23. Went to the rehab hospital in [place] 12/30/23 till 01/17/24 where patient had PT/OT. Patient is now able to do bed mobility (supine to sit) with some assistance on the leg every once in a while but patient can be independent most of the time. Patient is also now able to stand up on his own with rolling walker and some assistance from the wife.Patient can do bed to chair transfers by scooting. Patient also states that he is able to walk for around 70 ft with rolling walker and some stand by assist from caregivers. On his last day at the rehab hospital, patient was able to do stairs (4 steps x 4 rounds) in the rehab hospital with CGA. Patient is currently sleeping on the first floor of the house in a recliner because he cannot do his 13-steps stairs at home. Patient is now referred to outpatient PT evaluation and management.  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 No and Yes: NPRS scale: yes 2/10 Pain location: Rt thigh Pain description: sore Aggravating factors: supine position Relieving factors: pain medication, muscle relaxor  PRECAUTIONS: Other: spinal  RED FLAGS: None   WEIGHT BEARING RESTRICTIONS: No  FALLS:  Has patient fallen in last 6 months? No  LIVING ENVIRONMENT: Lives with: lives with their family Lives in: House/apartment Stairs: Yes: Internal: 13 steps; can reach both and External: 1 steps; none Has following equipment at home: Single point cane, Walker - 2 wheeled, Wheelchair (manual), shower chair, and Shower bench  OCCUPATION: on disability  PLOF: Needs assistance with ADLs, Needs assistance with gait, and Needs assistance with transfers  PATIENT GOALS: "to get back  to my main function - climbing steps"  NEXT MD VISIT: unknown  OBJECTIVE:  Note: Objective measures were completed at Evaluation unless otherwise noted.  DIAGNOSTIC FINDINGS:  new thoracic lesions found in PET scan   PATIENT SURVEYS:  Neuro Quality of Life to be determined  COGNITION: Overall cognitive status: Within functional limits for tasks assessed     SENSATION: Light touch: impaired on B with R > L  MUSCLE LENGTH: Hamstrings: mild to moderate restriction  POSTURE: rounded shoulders and forward head  LOWER EXTREMITY ROM:    MMT Right eval Left eval  Hip flexion Around 20%  [facility]  Hip extension    Hip abduction    Hip adduction    Hip internal rotation    Hip external rotation    Knee flexion [facility]  Knee extension Around 80% WFL  Ankle dorsiflexion Around 50% [facility]  Ankle plantarflexion [facility]  Ankle inversion    Ankle eversion     (Blank rows = not tested)  LOWER EXTREMITY MMT:     Active  Right eval Left eval Right 03/01/24 Left 03/01/24 Right 04/06/2024 Left 04/06/2024  Hip flexion 2+ 4- 3- 4/5 3+ 4-  Hip extension   Sidelying 2+ Sidelying 3/5    Hip abduction 3+ 4- 3+ 4- 4- 4-  Hip adduction        Hip internal rotation        Hip external rotation        Knee flexion 3+ 4- 3+ 4/5 3+ 4+  Knee extension 2+ 4- 4- 4/5 3+ 4+  Ankle dorsiflexion 2+ 4- 3+ 4/5 4- 4+  Ankle plantarflexion 3+ 4-      Ankle inversion        Ankle eversion         (Blank rows = not tested)  FUNCTIONAL TESTS:  2 minute walk test: 64 ft with CGA and rolling walker 30 sec chair stand test: 6x with CGA and UE assist on armrest/rolling walker.  03/01/24: 226 with RW CGA 30 sec chair stand test: 6x with no UE, RW infront though did not use  TRANSFERS  Bed to chair (and back): CGA to SBA with side scooting  Sit-to-stand: CGA with rolling walker GAIT: Distance walked: 64 ft Assistive device utilized: Walker - 2 wheeled Level of assistance: CGA and with w/c  follow Comments: done during , decreased swing on R, R foot slap, decreased stance time R  TREATMENT DATE:  04/20/2024   Therapeutic Exercise: -Nustep, 5 minutes, level 4, seat 12, arms 7, 70 spm -Seated heel/toe raises, 2 sets of 10 reps -LAQ, 2 sets of 10 reps -Seated Marching, 2 sets of 7, 3# ankle weight -Standing marching, 3# ankle weight -Seated, hip abductions, 2 sets of 10 reps   Therapeutic Activity: -Sit to stands, 2 sets of 10 reps, throughout sessions, pt cued for core activation -Functional ambulation around clinic, 2 bouts, 50 feet, pt reports increased pain in thigh,     04/14/24 Nustep seat 11 arms 7 level 3 x 10' Seated in [facility] Hip adduction with ball 5" hold 2 x 5 Calf stretch 10" hold x 10 with strap Hip Abduction with red theraband 2 x 10 Hip flexion with red theraband 2 x 10   04/06/2024  -SpO2 90% reading -Seated BP: 121/ 48 -30 Second Chair Stand Test: 5x  Norms:   Age 50-64 47-69 60-74 59-79 33-84 85-89 90-94  Women 15 15 14 13 12 11 9   Men 17 16 15 14 13 11 9    - : 24ft with 3 standing rest breaks -MMT  03/10/24:   Educated RICE techniques for pain and edema control Educated signs of infection: watch for redness and heat Abduction 2 x10 3" holds with RTB around thigh  Knee flexed pushing back 3" 3 sets 3reps prior fatigue Hip flexor stretch 3x 30" on 12in step  LAQ: 2x 10 3" holds 3# Squat 10x front of chair  03/08/24: Began on Nustep following wound tx total of 15' (not included with time this session)  STS 10x eccentric control no HHA Heel raise 6 reps, increased Rt LE pain  Tandem stance 3x 30" last set on foam intermittent HHA Sidestep with GTB around thigh 5RT inside // bars Squat 10x Hip flexor stretch 3x 30" on 12in step      PATIENT EDUCATION:  Education details: Educated on the goals and course of rehab.  Person educated: Patient and Spouse Education method: Explanation Education comprehension: verbalized  understanding  HOME EXERCISE PROGRAM: Access Code: [email or web address] URL: [email or web address] 02/29/24: Clam with RTB  02/11/2024 - Standing 3-Way Leg Reach with Resistance at Ankles and Counter Support  - 1 x daily -  7 x weekly - 2 sets - 10 reps - Seated Calf Stretch with Strap  - 2 x daily - 7 x weekly - 3 reps - 30 hold - Heel Toe Raises with Counter Support  - 2 x daily - 7 x weekly - 2 sets - 10 reps  Date: 02/03/2024 Prepared by: Vickii Grand Exercises - Beginner Bridge  - 2 x daily - 7 x weekly - 10 reps - 3 sec hold - Small Range Straight Leg Raise  - 2 x daily - 7 x weekly - 10 reps - Sitting to Supine Roll  - 7 x weekly - Seated Long Arc Quad  - 2 x daily - 7 x weekly - 10 reps - 5 sec hold - Sit to Stand Without Arm Support  - 2 x daily - 7 x weekly - 2 sets - 5 reps - Standing Hip Abduction  - 2 x daily - 7 x weekly - 10 reps  01/15/2024 - Mini Squat with Counter Support  - 2 x daily - 7 x weekly - 2 sets - 10 reps - 3 hold - Heel Raises with Counter Support  - 2 x daily - 7 x weekly - 2 sets - 10 reps     ASSESSMENT:  CLINICAL IMPRESSION: Patient continues to demonstrate difficulty walking, decreased LE strength, and impaired and balance. Patient also demonstrates decreased endurance with ambulation during today's session with increased nerve pain in R thigh during end of both ambulation bouts with RW. Patient able to progress dynamic balance and core activation exercises today with ambulation trials and STS functional transfer, good performance with verbal cueing. Patient would continue to benefit from skilled physical therapy for increased endurance with ambulation, increased LE strength, and improved balance for improved quality of life, improved independence with gait training and continued progress towards therapy goals.       Eval:  Patient is a 48 y.o. male who was seen today for physical therapy evaluation and treatment for s/p spinal surgery due to  metastatic cancer to the spine. Patient's condition is further defined by difficulty with transfers, ambulation and stair negotiation due to weakness, and impaired balance and proprioception. Skilled PT is required to address the impairments and functional limitations listed below.    OBJECTIVE IMPAIRMENTS: Abnormal gait, decreased activity tolerance, decreased balance, decreased mobility, difficulty walking, decreased strength, impaired flexibility, and impaired tone.   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   REHAB POTENTIAL: Fair    CLINICAL DECISION MAKING: Evolving/moderate complexity  EVALUATION COMPLEXITY: Moderate   GOALS: Goals reviewed with patient? Yes  SHORT TERM GOALS: Target date: 04/27/24  Pt will demonstrate indep in HEP to facilitate carry-over of skilled services and improve functional outcomes Goal status: IN PROGRESS; 03/01/24:  Reports compliance with HEP daily Goal status: MET  2.  Pt will be able to perform sit-to-stand with modified independence (use of rolling walker) Baseline: see above; 03/01/24:  Able to stand with no HHA or need for RW Goal status: MET  3.  Pt will be able to ambulate for > 200 ft with modified independence and use of rolling walker Baseline: see above; 03/01/24:  226 RW Goal status: MET  LONG TERM GOALS: Target date:05/18/24  Pt will be able to stand up >5x in the 30 sec chair-to-stand test without assistance to demonstrate clinically significant improvement in balance and LE strength.    Baseline: see above;  03/01/24: 30 second chair to stand 6x no HHA  Goal status: MET  2.  Pt will increase by at least 120 ft in order to demonstrate clinically significant improvement in community ambulation Baseline: 64 ft; 03/01/24:  246ft Goal status: MET  3.  Pt will demonstrate increase in LE strength to 3+/5 to facilitate ease and safety in ambulation  Baseline: 2+/5 (see above) Goal status: IN PROGRESS  4.  Pt will be able to negotiate stairs (4-5 steps) with alternating feet and use of railings with CGA-SBA Baseline: to be determined; 03/01/24:  Able to negotiate stairs with step to pattern and need for 2HR Goal status: IN PROGRESS  5.  Pt will be able to ambulate with LRAD >300  ft Baseline: see above; 03/01/24:  270ft with RW Goal status:IN PROGRESS  PLAN:  PT FREQUENCY: 2x week --->3x/ week after 6 weeks   PT DURATION: 6 weeks  PLANNED INTERVENTIONS: [id number]- PT Re-evaluation, 97750- Physical Performance Testing, 97110-Therapeutic exercises, 97530- Therapeutic activity, [id number]- Neuromuscular re-education, 97535- Self Care, 95621- Manual therapy, [id number]- Gait training, [id number]- Orthotic Initial, Patient/Family education, and Stair training.  PLAN FOR NEXT SESSION: Progress LE strengthening, balance, and gait activities.  Gait with LRAD.     Tamsen Reist, PT, DPT [facility] Office: [phone] 1:25 PM, 04/20/24

## 2024-04-26 ENCOUNTER — Ambulatory Visit (HOSPITAL_COMMUNITY)

## 2024-04-26 ENCOUNTER — Encounter (HOSPITAL_COMMUNITY): Payer: Self-pay

## 2024-04-26 DIAGNOSIS — R29898 Other symptoms and signs involving the musculoskeletal system: Secondary | ICD-10-CM

## 2024-04-26 DIAGNOSIS — R262 Difficulty in walking, not elsewhere classified: Secondary | ICD-10-CM

## 2024-04-26 NOTE — Therapy (Signed)
 OUTPATIENT PHYSICAL THERAPY TREATMENT  Patient Name: Scott Eaton MRN: [id number] DOB:Jun 04, 1976, 48 y.o., male Today's Date: 04/26/2024  END OF SESSION:  PT End of Session - 04/26/24 1519     Visit Number 19    Number of Visits 8    Date for PT Re-Evaluation 06/08/24    Authorization Type [employer] Comm PPO (no auth, visit limit 60 combined with OT)    Authorization - Visit Number 26   wound plus ortho   Authorization - Number of Visits 60    PT Start Time 1520    PT Stop Time 1600    PT Time Calculation (min) 40 min    Equipment Utilized During Treatment Gait belt    Activity Tolerance Patient tolerated treatment well;Patient limited by pain;Patient limited by fatigue    Behavior During Therapy [facility] for tasks assessed/performed                    Past Medical History:  Diagnosis Date   Cervical radiculopathy    cervical stenosis   Complication of anesthesia    They had a problem putting pt to sleep   Obesity    Past Surgical History:  Procedure Laterality Date   LAMINECTOMY N/A 10/12/2019   Procedure: Cervical Three-Five Posterior Cervical Fusion with Laminectomy and Resection of Tumor;  Surgeon: Manya Sells, MD;  Location: [facility] OR;  Service: Neurosurgery;  Laterality: N/A;  Cervical 3-4 Posterior cervical fusion with laminectomy and resection of tumor   TONSILLECTOMY     WISDOM TOOTH EXTRACTION     Patient Active Problem List   Diagnosis Date Noted   Status post cervical spinal fusion 10/12/2019   Elevated BP without diagnosis of hypertension 10/05/2019   COVID-19 virus infection 10/04/2019   Leukocytosis 10/04/2019   Metastatic cancer to spine (HCC) 10/02/2019    PCP: None on file  REFERRING PROVIDER: Apolinar Klippel, PA  REFERRING DIAG: C79.51 (ICD-10-CM) - Metastatic cancer to spine [facility])  Rationale for Evaluation and Treatment: Rehabilitation  THERAPY DIAG:  Difficulty in walking, not elsewhere classified  Weakness of both lower  extremities  Other symptoms and signs involving the musculoskeletal system  ONSET DATE: on 12/16/23: - ARTHRODESIS, POSTERIOR OR POSTEROLATERAL TECHNIQUE, SINGLE interspace; THORACIC (WITH LATERAL TRANSVERSE TECHNIQUE, WHEN PERFORMED) POSTERIOR SEGMENTAL INSTRUMENTATION (EG, PEDICLE FIXATION, DUAL RODS WITH MULTIPLE HOOKS AND SUBLAMINAR WIRES); 7 TO 12 VERTEBRAL SEGMENTS (LIST IN ADDITION TO PRIMARY PROCEDURE)   - LAMINECTOMY FOR BIOPSY/EXCISION OF INTRASPINAL NEOPLASM; EXTRADURAL, THORACIC VERTEBRAL CORPECTOMY (VERTEBRAL BODY RESECTION), PARTIAL OR COMPLETE, FOR EXCISION OF INTRASPINAL LESION, SINGLE SEGMENT; EXTRADURAL, THORACIC BY THORACOLUMBAR APPROACH  - PERCU VERTEBROPLASTY, 1 VERTEBRAL BODY, UNI OR BILATERAL INJECTION, INCL ALL IMAGING GUIDANCE; EACH ADD CERVICOTHORACIC OR LUMBOSACRAL VERTEBRAL BODY (LIST IN ADDITION TO CODE FOR PRIMARY PROCEDURE) TRANSPEDICULAR APPROACH WITH DECOMPRESSION OF SPINAL CORD, EQUINA AND/OR NERVE ROOT(S) (EG, HERNIATED INTERVERTEBRAL DISC), SINGLE SEGMENT; THORACIC ALLOGRAFT, MORSELIZED, OR PLACEMENT OF OSTEOPROMOTIVE MATERIAL, FOR SPINE SURGERY ONLY (LIST IN ADDITION TO PRIMARY PROCEDURE)  - INSERTION INTERBODY BIOMECHANICAL DEVICE WITH INTEGRAL ANTERIOR INSTRUMENTATION, TO INTERVERTEBRAL DISC SPACE IN CONJUNCTION INTERBODY ARTHRODESIS, EACH INTERSPACE (LIST CODE FOR PRIMARY PROCEDURE)   SUBJECTIVE:  SUBJECTIVE STATEMENT: Received shot in knee which helped some on 04/13/24.  Current pain scale 3/10.  EVAL: Arrives to the clinic with c/o weakness on the legs (R>L). Also reports that the legs are tingly. Patient had surgery for his midback on 12/16/23 (see above) due to metastatic cancer to the spine (patient has rods and screws). Stayed in the hospital until 12/30/23. Went to the rehab  hospital in [place] 12/30/23 till 01/17/24 where patient had PT/OT. Patient is now able to do bed mobility (supine to sit) with some assistance on the leg every once in a while but patient can be independent most of the time. Patient is also now able to stand up on his own with rolling walker and some assistance from the wife.Patient can do bed to chair transfers by scooting. Patient also states that he is able to walk for around 70 ft with rolling walker and some stand by assist from caregivers. On his last day at the rehab hospital, patient was able to do stairs (4 steps x 4 rounds) in the rehab hospital with CGA. Patient is currently sleeping on the first floor of the house in a recliner because he cannot do his 13-steps stairs at home. Patient is now referred to outpatient PT evaluation and management.  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 No and Yes: NPRS scale: yes 2/10 Pain location: Rt thigh Pain description: sore Aggravating factors: supine position Relieving factors: pain medication, muscle relaxor  PRECAUTIONS: Other: spinal  RED FLAGS: None   WEIGHT BEARING RESTRICTIONS: No  FALLS:  Has patient fallen in last 6 months? No  LIVING ENVIRONMENT: Lives with: lives with their family Lives in: House/apartment Stairs: Yes: Internal: 13 steps; can reach both and External: 1 steps; none Has following equipment at home: Single point cane, Walker - 2 wheeled, Wheelchair (manual), shower chair, and Shower bench  OCCUPATION: on disability  PLOF: Needs assistance with ADLs, Needs assistance with gait, and Needs assistance with transfers  PATIENT GOALS: "to get back to my main function - climbing steps"  NEXT MD VISIT: unknown  OBJECTIVE:  Note: Objective measures were completed at Evaluation unless otherwise noted.  DIAGNOSTIC  FINDINGS:  new thoracic lesions found in PET scan   PATIENT SURVEYS:  Neuro Quality of Life to be determined  COGNITION: Overall cognitive status: Within functional limits for tasks assessed     SENSATION: Light touch: impaired on B with R > L  MUSCLE LENGTH: Hamstrings: mild to moderate restriction  POSTURE: rounded shoulders and forward head  LOWER EXTREMITY ROM:    MMT Right eval Left eval  Hip flexion Around 20%  [facility]  Hip extension    Hip abduction    Hip adduction    Hip internal rotation    Hip external rotation    Knee flexion [facility]  Knee extension Around 80% WFL  Ankle dorsiflexion Around 50% [facility]  Ankle plantarflexion [facility]  Ankle inversion    Ankle eversion     (Blank rows = not tested)  LOWER EXTREMITY MMT:     Active  Right eval Left eval Right 03/01/24 Left 03/01/24 Right 04/06/2024 Left 04/06/2024  Hip flexion 2+ 4- 3- 4/5 3+ 4-  Hip extension   Sidelying 2+ Sidelying 3/5    Hip abduction 3+ 4- 3+ 4- 4- 4-  Hip  adduction        Hip internal rotation        Hip external rotation        Knee flexion 3+ 4- 3+ 4/5 3+ 4+  Knee extension 2+ 4- 4- 4/5 3+ 4+  Ankle dorsiflexion 2+ 4- 3+ 4/5 4- 4+  Ankle plantarflexion 3+ 4-      Ankle inversion        Ankle eversion         (Blank rows = not tested)  FUNCTIONAL TESTS:  2 minute walk test: 64 ft with CGA and rolling walker 30 sec chair stand test: 6x with CGA and UE assist on armrest/rolling walker.  03/01/24: 226 with RW CGA 30 sec chair stand test: 6x with no UE, RW infront though did not use  TRANSFERS  Bed to chair (and back): CGA to SBA with side scooting  Sit-to-stand: CGA with rolling walker GAIT: Distance walked: 64 ft Assistive device utilized: Walker - 2 wheeled Level of assistance: CGA and with w/c follow Comments: done during , decreased swing on R, R foot slap, decreased stance time R  TREATMENT DATE:  04/26/24:   : 2 sets able to ambulate 80ft for 58" prior need  to rest due to knee pain.  2nd set 44" prior need to rest Standing: - Marching 5x 5" holds with UE support, reports increased knee pain and requested to sit Seated: - Dorsiflexion 3# 20x - Plantar flexion 3# 20x - LAQ 3# 2x 10 - Marching 3# - Isometric abduction into belt 2x 10 - Sit to stand 5x prior request to stop due to knee pain  04/20/2024   Therapeutic Exercise: -Nustep, 5 minutes, level 4, seat 12, arms 7, 70 spm -Seated heel/toe raises, 2 sets of 10 reps -LAQ, 2 sets of 10 reps -Seated Marching, 2 sets of 7, 3# ankle weight -Standing marching, 3# ankle weight -Seated, hip abductions, 2 sets of 10 reps   Therapeutic Activity: -Sit to stands, 2 sets of 10 reps, throughout sessions, pt cued for core activation -Functional ambulation around clinic, 2 bouts, 50 feet, pt reports increased pain in thigh,     04/14/24 Nustep seat 11 arms 7 level 3 x 10' Seated in [facility] Hip adduction with ball 5" hold 2 x 5 Calf stretch 10" hold x 10 with strap Hip Abduction with red theraband 2 x 10 Hip flexion with red theraband 2 x 10   04/06/2024  -SpO2 90% reading -Seated BP: 121/ 48 -30 Second Chair Stand Test: 5x  Norms:   Age 83-64 52-69 68-74 51-79 17-84 85-89 90-94  Women 15 15 14 13 12 11 9   Men 17 16 15 14 13 11 9    - : 42ft with 3 standing rest breaks -MMT  03/10/24:   Educated RICE techniques for pain and edema control Educated signs of infection: watch for redness and heat Abduction 2 x10 3" holds with RTB around thigh  Knee flexed pushing back 3" 3 sets 3reps prior fatigue Hip flexor stretch 3x 30" on 12in step  LAQ: 2x 10 3" holds 3# Squat 10x front of chair  03/08/24: Began on Nustep following wound tx total of 15' (not included with time this session)  STS 10x eccentric control no HHA Heel raise 6 reps, increased Rt LE pain  Tandem stance 3x 30" last set on foam intermittent HHA Sidestep with GTB around thigh 5RT inside // bars Squat 10x Hip flexor  stretch 3x 30" on 12in step  PATIENT EDUCATION:  Education details: Educated on the goals and course of rehab.  Person educated: Patient and Spouse Education method: Explanation Education comprehension: verbalized understanding  HOME EXERCISE PROGRAM: Access Code: [email or web address] URL: [email or web address] 02/29/24: Clam with RTB  02/11/2024 - Standing 3-Way Leg Reach with Resistance at Ankles and Counter Support  - 1 x daily - 7 x weekly - 2 sets - 10 reps - Seated Calf Stretch with Strap  - 2 x daily - 7 x weekly - 3 reps - 30 hold - Heel Toe Raises with Counter Support  - 2 x daily - 7 x weekly - 2 sets - 10 reps  Date: 02/03/2024 Prepared by: Vickii Grand Exercises - Beginner Bridge  - 2 x daily - 7 x weekly - 10 reps - 3 sec hold - Small Range Straight Leg Raise  - 2 x daily - 7 x weekly - 10 reps - Sitting to Supine Roll  - 7 x weekly - Seated Long Arc Quad  - 2 x daily - 7 x weekly - 10 reps - 5 sec hold - Sit to Stand Without Arm Support  - 2 x daily - 7 x weekly - 2 sets - 5 reps - Standing Hip Abduction  - 2 x daily - 7 x weekly - 10 reps  01/15/2024 - Mini Squat with Counter Support  - 2 x daily - 7 x weekly - 2 sets - 10 reps - 3 hold - Heel Raises with Counter Support  - 2 x daily - 7 x weekly - 2 sets - 10 reps     ASSESSMENT:  CLINICAL IMPRESSION: Session focus with LE strengthening and gait training to improve activity tolerance.  Pt limited by knee pain with standing exercises.  complete with RW, pt able to tolerate walking for 58" prior need to sit.  Pt tolerated well to seated exercises though reports increased fatigue to levels 7-8/10.  Reviewed importance of pressure relief and encouraged pt to stand every 30 minutes for pressure relief to address buttock wound.         Eval:  Patient is a 48 y.o. male who was seen today for physical therapy evaluation and treatment for s/p spinal surgery due to metastatic cancer to the spine.  Patient's condition is further defined by difficulty with transfers, ambulation and stair negotiation due to weakness, and impaired balance and proprioception. Skilled PT is required to address the impairments and functional limitations listed below.    OBJECTIVE IMPAIRMENTS: Abnormal gait, decreased activity tolerance, decreased balance, decreased mobility, difficulty walking, decreased strength, impaired flexibility, and impaired tone.   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   REHAB POTENTIAL: Fair    CLINICAL DECISION MAKING: Evolving/moderate complexity  EVALUATION COMPLEXITY: Moderate   GOALS: Goals reviewed with patient? Yes  SHORT TERM GOALS: Target date: 04/27/24  Pt will demonstrate indep in HEP to facilitate carry-over of skilled services and improve functional outcomes Goal status: IN PROGRESS; 03/01/24:  Reports compliance with HEP daily Goal status: MET  2.  Pt will be able to perform sit-to-stand with modified independence (use of rolling walker) Baseline: see above; 03/01/24:  Able to  stand with no HHA or need for RW Goal status: MET  3.  Pt will be able to ambulate for > 200 ft with modified independence and use of rolling walker Baseline: see above; 03/01/24:  226 RW Goal status: MET  LONG TERM GOALS: Target date:05/18/24  Pt will be able to stand up >5x in the 30 sec chair-to-stand test without assistance to demonstrate clinically significant improvement in balance and LE strength.    Baseline: see above; 03/01/24: 30 second chair to  stand 6x no HHA  Goal status: MET  2.  Pt will increase by at least 120 ft in order to demonstrate clinically significant improvement in community ambulation Baseline: 64 ft; 03/01/24:  282ft Goal status: MET  3.  Pt will demonstrate increase in LE strength to 3+/5 to facilitate ease and safety in ambulation  Baseline: 2+/5 (see above) Goal status: IN PROGRESS  4.  Pt will be able to negotiate stairs (4-5 steps) with alternating feet and use of railings with CGA-SBA Baseline: to be determined; 03/01/24:  Able to negotiate stairs with step to pattern and need for 2HR Goal status: IN PROGRESS  5.  Pt will be able to ambulate with LRAD >300  ft Baseline: see above; 03/01/24:  272ft with RW Goal status:IN PROGRESS  PLAN:  PT FREQUENCY: 2x week --->3x/ week after 6 weeks   PT DURATION: 6 weeks  PLANNED INTERVENTIONS: 97164- PT Re-evaluation, 97750- Physical Performance Testing, 97110-Therapeutic exercises, 97530- Therapeutic activity, [id number]- Neuromuscular re-education, 97535- Self Care, 16109- Manual therapy, [id number]- Gait training, [phone]- Orthotic Initial, Patient/Family education, and Stair training.  PLAN FOR NEXT SESSION: Progress LE strengthening, balance, and gait activities.  Gait with LRAD.    Minor Amble, LPTA/CLT; CBIS [phone]  4:16 PM, 04/26/24

## 2024-04-27 ENCOUNTER — Encounter (HOSPITAL_COMMUNITY): Payer: Self-pay

## 2024-04-27 ENCOUNTER — Ambulatory Visit (HOSPITAL_COMMUNITY)

## 2024-04-27 ENCOUNTER — Ambulatory Visit (HOSPITAL_COMMUNITY): Admitting: Physical Therapy

## 2024-04-27 DIAGNOSIS — R262 Difficulty in walking, not elsewhere classified: Secondary | ICD-10-CM

## 2024-04-27 DIAGNOSIS — S31000D Unspecified open wound of lower back and pelvis without penetration into retroperitoneum, subsequent encounter: Secondary | ICD-10-CM

## 2024-04-27 DIAGNOSIS — S31829S Unspecified open wound of left buttock, sequela: Secondary | ICD-10-CM

## 2024-04-27 DIAGNOSIS — R29898 Other symptoms and signs involving the musculoskeletal system: Secondary | ICD-10-CM

## 2024-04-27 DIAGNOSIS — S31819S Unspecified open wound of right buttock, sequela: Secondary | ICD-10-CM

## 2024-04-27 NOTE — Therapy (Signed)
 OUTPATIENT PHYSICAL THERAPY WOUND TREATMENT/Progress   Patient Name: Scott Eaton MRN: [id number] DOB:30-Sep-1976, 48 y.o., male Today's Date: 04/27/2024  Progress Note Reporting Period 02/08/24 to 04/14/24  This is pt 4th wound treatment since evaluation.    See note below for Objective Data and Assessment of Progress/Goals.     PCP: none REFERRING PROVIDER: Cloretta Danes, MD/Jones, Jackie Mas, MD  END OF SESSION:  PT End of Session - 04/27/24 1551     Visit Number 7    Number of Visits 13    Date for PT Re-Evaluation 06/08/24    Authorization Type BCBS Comm PPO (no auth, visit limit 60 combined with OT)    Authorization - Visit Number 28    Authorization - Number of Visits 60    Progress Note Due on Visit 13    PT Start Time 1345    PT Stop Time 1435    PT Time Calculation (min) 50 min    Activity Tolerance Patient tolerated treatment well    Behavior During Therapy WFL for tasks assessed/performed              Past Medical History:  Diagnosis Date   Cervical radiculopathy    cervical stenosis   Complication of anesthesia    They had a problem putting pt to sleep   Obesity    Past Surgical History:  Procedure Laterality Date   LAMINECTOMY N/A 10/12/2019   Procedure: Cervical Three-Five Posterior Cervical Fusion with Laminectomy and Resection of Tumor;  Surgeon: Manya Sells, MD;  [facility];  Service: Neurosurgery;  Laterality: N/A;  Cervical 3-4 Posterior cervical fusion with laminectomy and resection of tumor   TONSILLECTOMY     WISDOM TOOTH EXTRACTION     Patient Active Problem List   Diagnosis Date Noted   Status post cervical spinal fusion 10/12/2019   Elevated BP without diagnosis of hypertension 10/05/2019   COVID-19 virus infection 10/04/2019   Leukocytosis 10/04/2019   Metastatic cancer to spine (HCC) 10/02/2019    ONSET DATE: January 2025  REFERRING DIAG:  pressure wound of coccygeal region-new referral area  Diagnosis   L89.306 (ICD-10-CM) - Pressure-induced deep tissue damage of unspecified buttock initial referral still working on     THERAPY DIAGnosis :  pressure wound of coccygeal region-new referral area  Diagnosis  L89.306 (ICD-10-CM) - Pressure-induced deep tissue damage of unspecified buttock initial referral still working on   Back pain   Subjective:   Patient had surgery for his midback on 12/16/23 (see above) due to metastatic cancer to the spine (patient has rods and screws). Stayed in the hospital until 12/30/23. Went to the rehab hospital in [place] 12/30/23 till 01/17/24 where patient had PT/OT.  Pt was very debilitated and was bed bound causing the first wound in the sacral area.  Pt was doing well until he had a set back and was readmitted to the hospitalized due to fluid in his lungs. During this time his mobility declined and he was sitting in the wheelchair more and now a second wound has appeared.  He now has orders for wound care for both areas.   Pt is sleeping in his recliner.    Rationale for Evaluation and Treatment: Rehabilitation     04/27/24 0001  Subjective Assessment  Subjective Pt states that he thinks that he made the wound worse last period as he thought the wound was on his Rt buttock more than the Lt and was trying to put more weight on  his Lt buttock, now he is trying to relieve the pressure off  both  Patient and Family Stated Goals wound to heal  Prior Treatments care at rehab and self care at home  Pain Assessment  Pain Scale 0-10  Pain Score 2  Evaluation and Treatment  Evaluation and Treatment Procedures Explained to Patient/Family Yes  Evaluation and Treatment Procedures agreed to  Wound / Incision (Open or Dehisced) 04/14/24 Buttocks Left  Date First Assessed/Time First Assessed: 04/14/24 1100   Location: Buttocks  Location Orientation: Left  Present on Admission: Yes  Wound Image   Dressing Type None  Dressing Status None  Dressing Change Frequency PRN  Site  / Wound Assessment Black  % Wound base Yellow/Fibrinous Exudate 50%  % Wound base Black/Eschar 50%  Peri-wound Assessment Erythema (blanchable)  Drainage Amount Scant  Treatment Cleansed;Debridement (Selective)  Wound / Incision (Open or Dehisced) 02/08/24 Other (Comment) Sacrum Mid sacral  Date First Assessed/Time First Assessed: 02/08/24 0810   Wound Type: (c) Other (Comment)  Location: Sacrum  Location Orientation: Mid  Wound Description (Comments): sacral  Present on Admission: Yes  Dressing Type Honey  Dressing Status Old drainage  Dressing Change Frequency PRN  Site / Wound Assessment Clean;Yellow  Peri-wound Assessment Intact;Erythema (blanchable)  Margins Epibole (rolled edges)  Drainage Description Serous  Selective Debridement (non-excisional)  Selective Debridement (non-excisional) - Location wound bed and edges  Selective Debridement (non-excisional) - Tools Used Scalpel;Forceps;Scissors  Selective Debridement (non-excisional) - Tissue Removed slough  Wound Therapy - Assess/Plan/Recommendations  Wound Therapy - Clinical Statement see below  Wound Therapy - Functional Problem List difficult/painful to sit  Factors Delaying/Impairing Wound Healing Immobility;Multiple medical problems;Polypharmacy  Hydrotherapy Plan Debridement;Dressing change;Patient/family education  Wound Therapy - Frequency Other (comment) (1 x a week for debridement; wife is taking care of wound)  Wound Plan PT for debridement and to ensure a wound healing environment is maintained.  Wound Therapy  Dressing  medihoney to wounds followed by gauze seccured by using sacral foam pad cut and reinforced with medipore and foam tape .       PATIENT EDUCATION: Education details: keep pressure off, increase water and protein intake, Complete 10 glut sets per hour.  Stand every hour work up to 15 minutes per hour of standing.  Person educated: Spouse Education method: Explanation and Verbal cues Education  comprehension: verbalized understanding   HOME EXERCISE PROGRAM: 10 glut sets every hour.    GOALS: Goals reviewed with patient? No  SHORT TERM GOALS: Target date: 02/29/24 revised target date of 05/12/24  PT pain to be no greater than a 1 Baseline: Goal status: IN PROGRESS  2.  Wounds to be 100% granulated  Baseline:  Goal status: IN PROGRESS  LONG TERM GOALS: Target date: 03/21/24; revised target date of 673/25  Pt to have no pain  Baseline:  Goal status: IN PROGRESS  2.  PT wounds to be healed  Baseline:  Goal status: IN PROGRESS   ASSESSMENT:  CLINICAL IMPRESSION:  PT buttock wound has improved.  Able to debride a significant amount of necrotic tissue off Lt buttock wound today.  Wound continues to have only yellow slough and black eschar but improved over last week.  PT original superior wound is healing nicely.  Mr. Ausmus will benefit from skilled PT to maintain a healing environment for this area.    IOBJECTIVE IMPAIRMENTS: difficulty walking, pain, and decreased skin integrity .   ACTIVITY LIMITATIONS: sitting, bathing, toileting, and dressing  PERSONAL FACTORS: Fitness and  1-2 comorbidities: cancer, immobility polypharmacy are also affecting patient's functional outcome.   REHAB POTENTIAL: Good  CLINICAL DECISION MAKING: Evolving/moderate complexity  EVALUATION COMPLEXITY: Moderate  PLAN: PT FREQUENCY: 1x/week  PT DURATION: 8 weeks  PLANNED INTERVENTIONS: 97535- Self Care and 60630- Wound care (first 20 sq cm)  PLAN FOR NEXT SESSION: debride, assess wound bed and change dressing as indicated.   Leodis Rainwater, PT CLT [phone] [facility] Ph: [phone] 04/27/2024, 3:57 PM

## 2024-04-27 NOTE — Therapy (Signed)
 OUTPATIENT PHYSICAL THERAPY TREATMENT  Patient Name: Scott Eaton MRN: [id number] DOB:July 17, 1976, 48 y.o., male Today's Date: 04/27/2024  END OF SESSION:  PT End of Session - 04/27/24 1346     Visit Number 20    Number of Visits 28    Date for PT Re-Evaluation 06/08/24    Authorization Type [employer] Comm PPO (no auth, visit limit 60 combined with OT)    Authorization - Visit Number 27   wound plus ortho   Authorization - Number of Visits 60    PT Start Time 1301    PT Stop Time 1341    PT Time Calculation (min) 40 min    Equipment Utilized During Treatment Gait belt    Activity Tolerance Patient tolerated treatment well;Patient limited by pain;Patient limited by fatigue    Behavior During Therapy [facility] for tasks assessed/performed                     Past Medical History:  Diagnosis Date   Cervical radiculopathy    cervical stenosis   Complication of anesthesia    They had a problem putting pt to sleep   Obesity    Past Surgical History:  Procedure Laterality Date   LAMINECTOMY N/A 10/12/2019   Procedure: Cervical Three-Five Posterior Cervical Fusion with Laminectomy and Resection of Tumor;  Surgeon: Manya Sells, MD;  Location: [facility] OR;  Service: Neurosurgery;  Laterality: N/A;  Cervical 3-4 Posterior cervical fusion with laminectomy and resection of tumor   TONSILLECTOMY     WISDOM TOOTH EXTRACTION     Patient Active Problem List   Diagnosis Date Noted   Status post cervical spinal fusion 10/12/2019   Elevated BP without diagnosis of hypertension 10/05/2019   COVID-19 virus infection 10/04/2019   Leukocytosis 10/04/2019   Metastatic cancer to spine (HCC) 10/02/2019    PCP: None on file  REFERRING PROVIDER: Apolinar Klippel, PA  REFERRING DIAG: C79.51 (ICD-10-CM) - Metastatic cancer to spine [facility])  Rationale for Evaluation and Treatment: Rehabilitation  THERAPY DIAG:  Difficulty in walking, not elsewhere classified  Weakness of both lower  extremities  Other symptoms and signs involving the musculoskeletal system  ONSET DATE: on 12/16/23: - ARTHRODESIS, POSTERIOR OR POSTEROLATERAL TECHNIQUE, SINGLE interspace; THORACIC (WITH LATERAL TRANSVERSE TECHNIQUE, WHEN PERFORMED) POSTERIOR SEGMENTAL INSTRUMENTATION (EG, PEDICLE FIXATION, DUAL RODS WITH MULTIPLE HOOKS AND SUBLAMINAR WIRES); 7 TO 12 VERTEBRAL SEGMENTS (LIST IN ADDITION TO PRIMARY PROCEDURE)   - LAMINECTOMY FOR BIOPSY/EXCISION OF INTRASPINAL NEOPLASM; EXTRADURAL, THORACIC VERTEBRAL CORPECTOMY (VERTEBRAL BODY RESECTION), PARTIAL OR COMPLETE, FOR EXCISION OF INTRASPINAL LESION, SINGLE SEGMENT; EXTRADURAL, THORACIC BY THORACOLUMBAR APPROACH  - PERCU VERTEBROPLASTY, 1 VERTEBRAL BODY, UNI OR BILATERAL INJECTION, INCL ALL IMAGING GUIDANCE; EACH ADD CERVICOTHORACIC OR LUMBOSACRAL VERTEBRAL BODY (LIST IN ADDITION TO CODE FOR PRIMARY PROCEDURE) TRANSPEDICULAR APPROACH WITH DECOMPRESSION OF SPINAL CORD, EQUINA AND/OR NERVE ROOT(S) (EG, HERNIATED INTERVERTEBRAL DISC), SINGLE SEGMENT; THORACIC ALLOGRAFT, MORSELIZED, OR PLACEMENT OF OSTEOPROMOTIVE MATERIAL, FOR SPINE SURGERY ONLY (LIST IN ADDITION TO PRIMARY PROCEDURE)  - INSERTION INTERBODY BIOMECHANICAL DEVICE WITH INTEGRAL ANTERIOR INSTRUMENTATION, TO INTERVERTEBRAL DISC SPACE IN CONJUNCTION INTERBODY ARTHRODESIS, EACH INTERSPACE (LIST CODE FOR PRIMARY PROCEDURE)   SUBJECTIVE:  SUBJECTIVE STATEMENT: Reports increased knee pain today, pain scale 5/10.  EVAL: Arrives to the clinic with c/o weakness on the legs (R>L). Also reports that the legs are tingly. Patient had surgery for his midback on 12/16/23 (see above) due to metastatic cancer to the spine (patient has rods and screws). Stayed in the hospital until 12/30/23. Went to the rehab hospital in [place]  12/30/23 till 01/17/24 where patient had PT/OT. Patient is now able to do bed mobility (supine to sit) with some assistance on the leg every once in a while but patient can be independent most of the time. Patient is also now able to stand up on his own with rolling walker and some assistance from the wife.Patient can do bed to chair transfers by scooting. Patient also states that he is able to walk for around 70 ft with rolling walker and some stand by assist from caregivers. On his last day at the rehab hospital, patient was able to do stairs (4 steps x 4 rounds) in the rehab hospital with CGA. Patient is currently sleeping on the first floor of the house in a recliner because he cannot do his 13-steps stairs at home. Patient is now referred to outpatient PT evaluation and management.  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 No and Yes: NPRS scale: yes 2/10 Pain location: Rt thigh Pain description: sore Aggravating factors: supine position Relieving factors: pain medication, muscle relaxor  PRECAUTIONS: Other: spinal  RED FLAGS: None   WEIGHT BEARING RESTRICTIONS: No  FALLS:  Has patient fallen in last 6 months? No  LIVING ENVIRONMENT: Lives with: lives with their family Lives in: House/apartment Stairs: Yes: Internal: 13 steps; can reach both and External: 1 steps; none Has following equipment at home: Single point cane, Walker - 2 wheeled, Wheelchair (manual), shower chair, and Shower bench  OCCUPATION: on disability  PLOF: Needs assistance with ADLs, Needs assistance with gait, and Needs assistance with transfers  PATIENT GOALS: "to get back to my main function - climbing steps"  NEXT MD VISIT: unknown  OBJECTIVE:  Note: Objective measures were completed at Evaluation unless otherwise noted.  DIAGNOSTIC FINDINGS:  new  thoracic lesions found in PET scan   PATIENT SURVEYS:  Neuro Quality of Life to be determined  COGNITION: Overall cognitive status: Within functional limits for tasks assessed     SENSATION: Light touch: impaired on B with R > L  MUSCLE LENGTH: Hamstrings: mild to moderate restriction  POSTURE: rounded shoulders and forward head  LOWER EXTREMITY ROM:    MMT Right eval Left eval  Hip flexion Around 20%  [facility]  Hip extension    Hip abduction    Hip adduction    Hip internal rotation    Hip external rotation    Knee flexion [facility]  Knee extension Around 80% WFL  Ankle dorsiflexion Around 50% [facility]  Ankle plantarflexion [facility]  Ankle inversion    Ankle eversion     (Blank rows = not tested)  LOWER EXTREMITY MMT:     Active  Right eval Left eval Right 03/01/24 Left 03/01/24 Right 04/06/2024 Left 04/06/2024  Hip flexion 2+ 4- 3- 4/5 3+ 4-  Hip extension   Sidelying 2+ Sidelying 3/5    Hip abduction 3+ 4- 3+ 4- 4- 4-  Hip adduction  Hip internal rotation        Hip external rotation        Knee flexion 3+ 4- 3+ 4/5 3+ 4+  Knee extension 2+ 4- 4- 4/5 3+ 4+  Ankle dorsiflexion 2+ 4- 3+ 4/5 4- 4+  Ankle plantarflexion 3+ 4-      Ankle inversion        Ankle eversion         (Blank rows = not tested)  FUNCTIONAL TESTS:  2 minute walk test: 64 ft with CGA and rolling walker 30 sec chair stand test: 6x with CGA and UE assist on armrest/rolling walker.  03/01/24: 226 with RW CGA 30 sec chair stand test: 6x with no UE, RW infront though did not use  TRANSFERS  Bed to chair (and back): CGA to SBA with side scooting  Sit-to-stand: CGA with rolling walker GAIT: Distance walked: 64 ft Assistive device utilized: Walker - 2 wheeled Level of assistance: CGA and with w/c follow Comments: done during , decreased swing on R, R foot slap, decreased stance time R  TREATMENT DATE:  04/27/24: Avanell Leigh: [place] L4 5' Standing: Forward flexed on counter Hip  extension- increased Rt knee pain.  Seated: Wback 10x RTB 2x 10 5" rows Sitting on dynadisc marching 4 sets 5reps 5" holds no UE support Sitting on dynadisc LAQ 4 sets 5 reps 3# no UE support Hamstring curls with RTB 2x 10 RTB around thighs 2x 10  04/26/24:   : 2 sets able to ambulate 26ft for 58" prior need to rest due to knee pain.  2nd set 44" prior need to rest Standing: - Marching 5x 5" holds with UE support, reports increased knee pain and requested to sit Seated: - Dorsiflexion 3# 20x - Plantar flexion 3# 20x - LAQ 3# 2x 10 - Marching 3# - Isometric abduction into belt 2x 10 - Sit to stand 5x prior request to stop due to knee pain  04/20/2024   Therapeutic Exercise: -Nustep, 5 minutes, level 4, seat 12, arms 7, 70 spm -Seated heel/toe raises, 2 sets of 10 reps -LAQ, 2 sets of 10 reps -Seated Marching, 2 sets of 7, 3# ankle weight -Standing marching, 3# ankle weight -Seated, hip abductions, 2 sets of 10 reps   Therapeutic Activity: -Sit to stands, 2 sets of 10 reps, throughout sessions, pt cued for core activation -Functional ambulation around clinic, 2 bouts, 50 feet, pt reports increased pain in thigh,     04/14/24 Nustep seat 11 arms 7 level 3 x 10' Seated in [facility] Hip adduction with ball 5" hold 2 x 5 Calf stretch 10" hold x 10 with strap Hip Abduction with red theraband 2 x 10 Hip flexion with red theraband 2 x 10   04/06/2024  -SpO2 90% reading -Seated BP: 121/ 48 -30 Second Chair Stand Test: 5x  Norms:   Age 58-64 54-69 77-74 75-79 62-84 85-89 90-94  Women 15 15 14 13 12 11 9   Men 17 16 15 14 13 11 9    - : 49ft with 3 standing rest breaks -MMT  03/10/24:   Educated RICE techniques for pain and edema control Educated signs of infection: watch for redness and heat Abduction 2 x10 3" holds with RTB around thigh  Knee flexed pushing back 3" 3 sets 3reps prior fatigue Hip flexor stretch 3x 30" on 12in step  LAQ: 2x 10 3" holds 3# Squat 10x  front of chair  03/08/24: Began on Nustep following wound tx total of  15' (not included with time this session)  STS 10x eccentric control no HHA Heel raise 6 reps, increased Rt LE pain  Tandem stance 3x 30" last set on foam intermittent HHA Sidestep with GTB around thigh 5RT inside // bars Squat 10x Hip flexor stretch 3x 30" on 12in step      PATIENT EDUCATION:  Education details: Educated on the goals and course of rehab.  Person educated: Patient and Spouse Education method: Explanation Education comprehension: verbalized understanding  HOME EXERCISE PROGRAM: Access Code: [email or web address] URL: [email or web address] 02/29/24: Clam with RTB  02/11/2024 - Standing 3-Way Leg Reach with Resistance at Ankles and Counter Support  - 1 x daily - 7 x weekly - 2 sets - 10 reps - Seated Calf Stretch with Strap  - 2 x daily - 7 x weekly - 3 reps - 30 hold - Heel Toe Raises with Counter Support  - 2 x daily - 7 x weekly - 2 sets - 10 reps  Date: 02/03/2024 Prepared by: Vickii Grand Exercises - Beginner Bridge  - 2 x daily - 7 x weekly - 10 reps - 3 sec hold - Small Range Straight Leg Raise  - 2 x daily - 7 x weekly - 10 reps - Sitting to Supine Roll  - 7 x weekly - Seated Long Arc Quad  - 2 x daily - 7 x weekly - 10 reps - 5 sec hold - Sit to Stand Without Arm Support  - 2 x daily - 7 x weekly - 2 sets - 5 reps - Standing Hip Abduction  - 2 x daily - 7 x weekly - 10 reps  01/15/2024 - Mini Squat with Counter Support  - 2 x daily - 7 x weekly - 2 sets - 10 reps - 3 hold - Heel Raises with Counter Support  - 2 x daily - 7 x weekly - 2 sets - 10 reps  04/27/24: - Seated Scapular Retraction with External Rotation  - 2 x daily - 7 x weekly - 2 sets - 10 reps - 5" hold - Seated Shoulder Row with Anchored Resistance  - 2 x daily - 7 x weekly - 2 sets - 10 reps - 5" hold     ASSESSMENT:  CLINICAL IMPRESSION: Pt limited knee pain with weight bearing.  Pt presents with forward  rolled shoulder posture.  Educated importance of core and postural strengthening to assist with balance.  Added postural strengthening exercise and seated LE strengthening exercises complete on top of dynadisc for dynamic seated balance with some UE support required.  Pt tolerated well to sessions though continues to be limited knee pain with reports of throbbing going down knee following attempt of standing exercise. Fatigue levels around 6/10 at EOS.     Eval:  Patient is a 48 y.o. male who was seen today for physical therapy evaluation and treatment for s/p spinal surgery due to metastatic cancer to the spine. Patient's condition is further defined by difficulty with transfers, ambulation and stair negotiation due to weakness, and impaired balance and proprioception. Skilled PT is required to address the impairments and functional limitations listed below.    OBJECTIVE IMPAIRMENTS: Abnormal gait, decreased activity tolerance, decreased balance, decreased mobility, difficulty walking, decreased strength, impaired flexibility, and impaired tone.   ACTIVITY LIMITATIONS: carrying, lifting, bending, standing, squatting, stairs, transfers, bed mobility, and locomotion level  PARTICIPATION LIMITATIONS: meal prep, cleaning, laundry, driving, shopping, community activity, and yard work  PERSONAL FACTORS: 1-2 comorbidities: stage IV paraganglioma  metastatic to the spine s/p radiation and C3-5 PCDF with CT showing C4 pathologic fracture s/p C4 corpectomy and C3-5 ACDF  are also affecting patient's functional outcome.   REHAB POTENTIAL: Fair    CLINICAL DECISION MAKING: Evolving/moderate complexity  EVALUATION COMPLEXITY: Moderate   GOALS: Goals reviewed with patient? Yes  SHORT TERM GOALS: Target date: 04/27/24  Pt will demonstrate indep in HEP to facilitate carry-over of skilled services and improve functional outcomes Goal status: IN PROGRESS; 03/01/24:  Reports compliance with HEP daily Goal  status: MET  2.  Pt will be able to perform sit-to-stand with modified independence (use of rolling walker) Baseline: see above; 03/01/24:  Able to stand with no HHA or need for RW Goal status: MET  3.  Pt will be able to ambulate for > 200 ft with modified independence and use of rolling walker Baseline: see above; 03/01/24:  226 RW Goal status: MET  LONG TERM GOALS: Target date:05/18/24  Pt will be able to stand up >5x in the 30 sec chair-to-stand test without assistance to demonstrate clinically significant improvement in balance and LE strength.    Baseline: see above; 03/01/24: 30 second chair to stand 6x no HHA  Goal status: MET  2.  Pt will increase by at least 120 ft in order to demonstrate clinically significant improvement in community ambulation Baseline: 64 ft; 03/01/24:  249ft Goal status: MET  3.  Pt will demonstrate increase in LE strength to 3+/5 to facilitate ease and safety in ambulation  Baseline: 2+/5 (see above) Goal status: IN PROGRESS  4.  Pt will be able to negotiate stairs (4-5 steps) with alternating feet and use of railings with CGA-SBA Baseline: to be determined; 03/01/24:  Able to negotiate stairs with step to pattern and need for 2HR Goal status: IN PROGRESS  5.  Pt will be able to ambulate with LRAD >300  ft Baseline: see above; 03/01/24:  253ft with RW Goal status:IN PROGRESS  PLAN:  PT FREQUENCY: 2x week --->3x/ week after 6 weeks   PT DURATION: 6 weeks  PLANNED INTERVENTIONS: 97164- PT Re-evaluation, 97750- Physical Performance Testing, 97110-Therapeutic exercises, 97530- Therapeutic activity, [id number]- Neuromuscular re-education, 97535- Self Care, 16109- Manual therapy, [id number]- Gait training, [phone]- Orthotic Initial, Patient/Family education, and Stair training.  PLAN FOR NEXT SESSION: Progress LE strengthening, balance, and gait activities.  Gait with LRAD.    Minor Amble, LPTA/CLT; Johnye Napoleon [phone]  4:39 PM,  04/27/24

## 2024-05-03 ENCOUNTER — Ambulatory Visit (HOSPITAL_COMMUNITY): Admitting: Physical Therapy

## 2024-05-03 ENCOUNTER — Encounter (HOSPITAL_COMMUNITY)

## 2024-05-04 ENCOUNTER — Encounter (HOSPITAL_COMMUNITY): Admitting: Physical Therapy

## 2024-05-04 ENCOUNTER — Encounter (HOSPITAL_COMMUNITY): Payer: Self-pay | Admitting: Physical Therapy

## 2024-05-04 ENCOUNTER — Encounter (HOSPITAL_COMMUNITY)

## 2024-05-04 ENCOUNTER — Ambulatory Visit (HOSPITAL_COMMUNITY): Admitting: Physical Therapy

## 2024-05-04 DIAGNOSIS — S31819S Unspecified open wound of right buttock, sequela: Secondary | ICD-10-CM

## 2024-05-04 DIAGNOSIS — S31000D Unspecified open wound of lower back and pelvis without penetration into retroperitoneum, subsequent encounter: Secondary | ICD-10-CM

## 2024-05-04 DIAGNOSIS — R262 Difficulty in walking, not elsewhere classified: Secondary | ICD-10-CM | POA: Diagnosis not present

## 2024-05-04 DIAGNOSIS — S31829S Unspecified open wound of left buttock, sequela: Secondary | ICD-10-CM

## 2024-05-04 NOTE — Therapy (Signed)
 OUTPATIENT PHYSICAL THERAPY WOUND TREATMENT   Patient Name: Scott Eaton MRN: [id number] DOB:[birth year], 48 y.o., male Today's Date: 05/04/2024  PCP: none REFERRING PROVIDER: Cloretta Danes, MD/Jones, Jackie Mas, MD  END OF SESSION:  PT End of Session - 05/04/24 1200     Visit Number 8    Number of Visits 13    Date for PT Re-Evaluation 06/08/24    Authorization Type [employer]BS Comm PPO (no auth, visit limit 60 combined with OT)    Authorization - Visit Number 29    Authorization - Number of Visits 60    Progress Note Due on Visit 13    PT Start Time 1120    PT Stop Time 1155    PT Time Calculation (min) 35 min    Activity Tolerance Patient tolerated treatment well    Behavior During Therapy WFL for tasks assessed/performed              Past Medical History:  Diagnosis Date   Cervical radiculopathy    cervical stenosis   Complication of anesthesia    They had a problem putting pt to sleep   Obesity    Past Surgical History:  Procedure Laterality Date   LAMINECTOMY N/A 10/12/2019   Procedure: Cervical Three-Five Posterior Cervical Fusion with Laminectomy and Resection of Tumor;  Surgeon: Manya Sells, MD;  Location: [facility] OR;  Service: Neurosurgery;  Laterality: N/A;  Cervical 3-4 Posterior cervical fusion with laminectomy and resection of tumor   TONSILLECTOMY     WISDOM TOOTH EXTRACTION     Patient Active Problem List   Diagnosis Date Noted   Status post cervical spinal fusion 10/12/2019   Elevated BP without diagnosis of hypertension 10/05/2019   COVID-19 virus infection 10/04/2019   Leukocytosis 10/04/2019   Metastatic cancer to spine (HCC) 10/02/2019    ONSET DATE: January 2025  REFERRING DIAG:  pressure wound of coccygeal region-new referral area  Diagnosis  L89.306 (ICD-10-CM) - Pressure-induced deep tissue damage of unspecified buttock initial referral still working on     THERAPY DIAGnosis :  pressure wound of coccygeal region-new referral  area  Diagnosis  L89.306 (ICD-10-CM) - Pressure-induced deep tissue damage of unspecified buttock initial referral still working on   Back pain   hx   Patient had surgery for his midback on 12/16/23 (see above) due to metastatic cancer to the spine (patient has rods and screws). Stayed in the hospital until 12/30/23. Went to the rehab hospital in [place] 12/30/23 till 01/17/24 where patient had PT/OT.  Pt was very debilitated and was bed bound causing the first wound in the sacral area.  Pt was doing well until he had a set back and was readmitted to the hospitalized due to fluid in his lungs. During this time his mobility declined and he was sitting in the wheelchair more and now a second wound has appeared.  He now has orders for wound care for both areas.   Pt is sleeping in his recliner.    Rationale for Evaluation and Treatment: Rehabilitation   05/04/24 0001  Subjective Assessment  Subjective Pt states that his knee is really bothering him  Patient and Family Stated Goals wound to heal  Prior Treatments care at rehab and self care at home  Pain Assessment  Pain Scale 0-10  Pain Score 2  Evaluation and Treatment  Evaluation and Treatment Procedures Explained to Patient/Family Yes  Evaluation and Treatment Procedures agreed to  Wound / Incision (Open or Dehisced) 04/14/24 Buttocks Left  Date First Assessed/Time First Assessed: 04/14/24 1100   Location: Buttocks  Location Orientation: Left  Present on Admission: Yes  Wound Image   Dressing Type None  Dressing Status None  Dressing Change Frequency PRN  Site / Wound Assessment Black  % Wound base Yellow/Fibrinous Exudate 60%  % Wound base Black/Eschar 40%  Peri-wound Assessment Erythema (blanchable)  Wound Length (cm) 6 cm  Wound Width (cm) 6.5 cm  Wound Surface Area (cm^2) 39 cm^2  Undermining (cm) 2.3  Drainage Amount Minimal  Treatment Cleansed;Debridement (Selective)  Wound / Incision (Open or Dehisced) 02/08/24 Other (Comment)  Sacrum Mid sacral  Date First Assessed/Time First Assessed: 02/08/24 0810   Wound Type: (c) Other (Comment)  Location: Sacrum  Location Orientation: Mid  Wound Description (Comments): sacral  Present on Admission: Yes  Wound Image   Dressing Type Honey  Dressing Status Old drainage  Dressing Change Frequency PRN  Site / Wound Assessment Clean;Yellow  Peri-wound Assessment Intact;Erythema (blanchable)  Wound Length (cm) 0.5 cm  Wound Width (cm) 0.9 cm  Wound Depth (cm) 0.2 cm  Wound Volume (cm^3) 0.09 cm^3  Wound Surface Area (cm^2) 0.45 cm^2  Margins Epibole (rolled edges)  Drainage Amount Scant  Drainage Description Serous  Treatment Cleansed  Selective Debridement (non-excisional)  Selective Debridement (non-excisional) - Location wound bed and edges  Selective Debridement (non-excisional) - Tools Used Scalpel;Forceps;Scissors  Selective Debridement (non-excisional) - Tissue Removed slough  Wound Therapy - Assess/Plan/Recommendations  Wound Therapy - Clinical Statement see below  Wound Therapy - Functional Problem List difficult/painful to sit  Factors Delaying/Impairing Wound Healing Immobility;Multiple medical problems;Polypharmacy  Hydrotherapy Plan Debridement;Dressing change;Patient/family education  Wound Therapy - Frequency Other (comment) (1 x a week for debridement; wife is taking care of wound)  Wound Plan PT for debridement and to ensure a wound healing environment is maintained.  Wound Therapy  Dressing  medihoney to sacral wound, silverhydrofiber to Lt buttock wound followed by 4x4 foam dressing secured with medipore and foam tape    PATIENT EDUCATION: Education details: keep pressure off, increase water and protein intake, Complete 10 glut sets per hour.  Stand every hour work up to 15 minutes per hour of standing.  Person educated: Spouse Education method: Explanation and Verbal cues Education comprehension: verbalized understanding   HOME EXERCISE  PROGRAM: 10 glut sets every hour.    GOALS: Goals reviewed with patient? No  SHORT TERM GOALS: Target date: 02/29/24 revised target date of 05/12/24  PT pain to be no greater than a 1 Baseline: Goal status: IN PROGRESS  2.  Wounds to be 100% granulated  Baseline:  Goal status: IN PROGRESS  LONG TERM GOALS: Target date: 03/21/24; revised target date of 673/25  Pt to have no pain  Baseline:  Goal status: IN PROGRESS  2.  PT wounds to be healed  Baseline:  Goal status: IN PROGRESS   ASSESSMENT:  CLINICAL IMPRESSION:    PT original superior wound continues to heal nicely. Left buttock wound continues to progress.  Therapist able to debride a significant amount of necrotic tissue from wound bed.  Wound now has increased depth and superior undermining with increased drainage.  Therapist changed dressing to this area to silver hydrofiber.   Mr. Otting will benefit from skilled PT to maintain a healing environment for this area.    IOBJECTIVE IMPAIRMENTS: difficulty walking, pain, and decreased skin integrity .   ACTIVITY LIMITATIONS: sitting, bathing, toileting, and dressing  PERSONAL FACTORS: Fitness and 1-2 comorbidities: cancer, immobility polypharmacy are  also affecting patient's functional outcome.   REHAB POTENTIAL: Good  CLINICAL DECISION MAKING: Evolving/moderate complexity  EVALUATION COMPLEXITY: Moderate  PLAN: PT FREQUENCY: 1x/week  PT DURATION: 8 weeks  PLANNED INTERVENTIONS: 97535- Self Care and 40981- Wound care (first 20 sq cm)  PLAN FOR NEXT SESSION: debride, assess wound bed and change dressing as indicated.   Leodis Rainwater, PT CLT [phone] [facility] Ph: [phone] 05/04/2024, 12:07 PM

## 2024-05-08 ENCOUNTER — Encounter (HOSPITAL_COMMUNITY): Admitting: Physical Therapy

## 2024-05-10 ENCOUNTER — Encounter (HOSPITAL_COMMUNITY)

## 2024-05-10 ENCOUNTER — Ambulatory Visit (HOSPITAL_COMMUNITY): Admitting: Physical Therapy

## 2024-05-10 DIAGNOSIS — S31829S Unspecified open wound of left buttock, sequela: Secondary | ICD-10-CM | POA: Diagnosis present

## 2024-05-10 DIAGNOSIS — S31000D Unspecified open wound of lower back and pelvis without penetration into retroperitoneum, subsequent encounter: Secondary | ICD-10-CM | POA: Diagnosis present

## 2024-05-10 DIAGNOSIS — S31819S Unspecified open wound of right buttock, sequela: Secondary | ICD-10-CM | POA: Diagnosis present

## 2024-05-10 NOTE — Therapy (Signed)
 OUTPATIENT PHYSICAL THERAPY WOUND TREATMENT   Patient Name: Scott Eaton MRN: [id number] DOB:08/10/1976, 48 y.o., male Today's Date: 05/10/2024  PCP: none REFERRING PROVIDER: Cloretta Danes, MD/Jones, Jackie Mas, MD  END OF SESSION:  PT End of Session - 05/10/24 1626     Visit Number 9    Number of Visits 13    Date for PT Re-Evaluation 06/08/24    Authorization Type [employer] PPO (no auth, visit limit 60 combined with OT)    Authorization - Visit Number 30    Authorization - Number of Visits 60    Progress Note Due on Visit 13    PT Start Time 1245    PT Stop Time 1328    PT Time Calculation (min) 43 min              Past Medical History:  Diagnosis Date   Cervical radiculopathy    cervical stenosis   Complication of anesthesia    They had a problem putting pt to sleep   Obesity    Past Surgical History:  Procedure Laterality Date   LAMINECTOMY N/A 10/12/2019   Procedure: Cervical Three-Five Posterior Cervical Fusion with Laminectomy and Resection of Tumor;  Surgeon: Manya Sells, MD;  Location: [facility] OR;  Service: Neurosurgery;  Laterality: N/A;  Cervical 3-4 Posterior cervical fusion with laminectomy and resection of tumor   TONSILLECTOMY     WISDOM TOOTH EXTRACTION     Patient Active Problem List   Diagnosis Date Noted   Status post cervical spinal fusion 10/12/2019   Elevated BP without diagnosis of hypertension 10/05/2019   COVID-19 virus infection 10/04/2019   Leukocytosis 10/04/2019   Metastatic cancer to spine (HCC) 10/02/2019    ONSET DATE: January 2025  REFERRING DIAG:  pressure wound of coccygeal region-new referral area  Diagnosis  L89.306 (ICD-10-CM) - Pressure-induced deep tissue damage of unspecified buttock initial referral still working on     THERAPY DIAGnosis :  pressure wound of coccygeal region-new referral area  Diagnosis  L89.306 (ICD-10-CM) - Pressure-induced deep tissue damage of unspecified buttock initial referral  still working on   Back pain   hx   Patient had surgery for his midback on 12/16/23 (see above) due to metastatic cancer to the spine (patient has rods and screws). Stayed in the hospital until 12/30/23. Went to the rehab hospital in [place] 12/30/23 till 01/17/24 where patient had PT/OT.  Pt was very debilitated and was bed bound causing the first wound in the sacral area.  Pt was doing well until he had a set back and was readmitted to the hospitalized due to fluid in his lungs. During this time his mobility declined and he was sitting in the wheelchair more and now a second wound has appeared.  He now has orders for wound care for both areas.   Pt is sleeping in his recliner.    Rationale for Evaluation and Treatment: Rehabilitation   05/04/24 0001  Subjective Assessment  Subjective Pt states that his knee is really bothering him  Patient and Family Stated Goals wound to heal  Prior Treatments care at rehab and self care at home  Pain Assessment  Pain Scale 0-10  Pain Score 2  Evaluation and Treatment  Evaluation and Treatment Procedures Explained to Patient/Family Yes  Evaluation and Treatment Procedures agreed to  Wound / Incision (Open or Dehisced) 04/14/24 Buttocks Left  Date First Assessed/Time First Assessed: 04/14/24 1100   Location: Buttocks  Location Orientation: Left  Present on  Admission: Yes  Wound Image   Dressing Type None  Dressing Status None  Dressing Change Frequency PRN  Site / Wound Assessment Black  % Wound base Yellow/Fibrinous Exudate 60%  % Wound base Black/Eschar 40%  Peri-wound Assessment Erythema (blanchable)  Wound Length (cm) 6 cm  Wound Width (cm) 6.5 cm  Wound Surface Area (cm^2) 39 cm^2  Undermining (cm) 2.3  Drainage Amount Minimal  Treatment Cleansed;Debridement (Selective)  Wound / Incision (Open or Dehisced) 02/08/24 Other (Comment) Sacrum Mid sacral  Date First Assessed/Time First Assessed: 02/08/24 0810   Wound Type: (c) Other (Comment)   Location: Sacrum  Location Orientation: Mid  Wound Description (Comments): sacral  Present on Admission: Yes  Wound Image   Dressing Type Honey  Dressing Status Old drainage  Dressing Change Frequency PRN  Site / Wound Assessment Clean;Yellow  Peri-wound Assessment Intact;Erythema (blanchable)  Wound Length (cm) 0.5 cm  Wound Width (cm) 0.9 cm  Wound Depth (cm) 0.2 cm  Wound Volume (cm^3) 0.09 cm^3  Wound Surface Area (cm^2) 0.45 cm^2  Margins Epibole (rolled edges)  Drainage Amount Scant  Drainage Description Serous  Treatment Cleansed  Selective Debridement (non-excisional)  Selective Debridement (non-excisional) - Location wound bed and edges  Selective Debridement (non-excisional) - Tools Used Scalpel;Forceps;Scissors  Selective Debridement (non-excisional) - Tissue Removed slough  Wound Therapy - Assess/Plan/Recommendations  Wound Therapy - Clinical Statement see below  Wound Therapy - Functional Problem List difficult/painful to sit  Factors Delaying/Impairing Wound Healing Immobility;Multiple medical problems;Polypharmacy  Hydrotherapy Plan Debridement;Dressing change;Patient/family education  Wound Therapy - Frequency Other (comment) (1 x a week for debridement; wife is taking care of wound)  Wound Plan PT for debridement and to ensure a wound healing environment is maintained.  Wound Therapy  Dressing  medihoney to sacral wound, silverhydrofiber to Lt buttock wound followed by 4x4 foam dressing secured with medipore and foam tape    PATIENT EDUCATION: Education details: keep pressure off, increase water and protein intake, Complete 10 glut sets per hour.  Stand every hour work up to 15 minutes per hour of standing.  Person educated: Spouse Education method: Explanation and Verbal cues Education comprehension: verbalized understanding   HOME EXERCISE PROGRAM: 10 glut sets every hour.    GOALS: Goals reviewed with patient? No  SHORT TERM GOALS: Target date:  02/29/24 revised target date of 05/12/24  PT pain to be no greater than a 1 Baseline: Goal status: IN PROGRESS  2.  Wounds to be 100% granulated  Baseline:  Goal status: IN PROGRESS  LONG TERM GOALS: Target date: 03/21/24; revised target date of 673/25  Pt to have no pain  Baseline:  Goal status: IN PROGRESS  2.  PT wounds to be healed  Baseline:  Goal status: IN PROGRESS   ASSESSMENT:  CLINICAL IMPRESSION:    . Left buttock wound continues to increase in depth and undermining.  Therapist recommends pt to call his primary and request a consult to the wound center in [facility] for a consult.  .  Therapist continues to debride necrotic tissue from wound bed.  Therapist changed dressing to this area to silver hydrofiber.   Mr. Nickless will benefit from skilled PT to maintain a healing environment for this area.    IOBJECTIVE IMPAIRMENTS: difficulty walking, pain, and decreased skin integrity .   ACTIVITY LIMITATIONS: sitting, bathing, toileting, and dressing  PERSONAL FACTORS: Fitness and 1-2 comorbidities: cancer, immobility polypharmacy are also affecting patient's functional outcome.   REHAB POTENTIAL: Good  CLINICAL DECISION MAKING:  Evolving/moderate complexity  EVALUATION COMPLEXITY: Moderate  PLAN: PT FREQUENCY: 1x/week  PT DURATION: 8 weeks  PLANNED INTERVENTIONS: 97535- Self Care and 16109- Wound care (first 20 sq cm)  PLAN FOR NEXT SESSION: debride, assess wound bed and change dressing as indicated.   Leodis Rainwater, PT CLT [phone] [facility] Ph: [phone] 05/10/2024, 4:35 PM

## 2024-05-16 ENCOUNTER — Encounter (HOSPITAL_COMMUNITY): Admitting: Physical Therapy

## 2024-05-18 ENCOUNTER — Ambulatory Visit (HOSPITAL_COMMUNITY): Admitting: Physical Therapy

## 2024-05-18 DIAGNOSIS — S31829S Unspecified open wound of left buttock, sequela: Secondary | ICD-10-CM

## 2024-05-18 DIAGNOSIS — S31819S Unspecified open wound of right buttock, sequela: Secondary | ICD-10-CM

## 2024-05-18 DIAGNOSIS — S31000D Unspecified open wound of lower back and pelvis without penetration into retroperitoneum, subsequent encounter: Secondary | ICD-10-CM

## 2024-05-18 NOTE — Therapy (Signed)
 OUTPATIENT PHYSICAL THERAPY WOUND TREATMENT   Patient Name: Scott Eaton MRN: [id number] DOB:[birth year], 48 y.o., male Today's Date: 05/18/2024  PCP: none REFERRING PROVIDER: Cloretta Danes, MD/Jones, Jackie Mas, MD  END OF SESSION:  PT End of Session - 05/18/24 1428     Visit Number 10    Number of Visits 13    Date for PT Re-Evaluation 06/08/24    Authorization Type [employer] PPO (no auth, visit limit 60 combined with OT)    Authorization - Visit Number 31    Authorization - Number of Visits 60    Progress Note Due on Visit 23    PT Start Time 1257   pt late for appointment   PT Stop Time 1340    PT Time Calculation (min) 43 min    Activity Tolerance Patient tolerated treatment well    Behavior During Therapy WFL for tasks assessed/performed           Past Medical History:  Diagnosis Date   Cervical radiculopathy    cervical stenosis   Complication of anesthesia    They had a problem putting pt to sleep   Obesity    Past Surgical History:  Procedure Laterality Date   LAMINECTOMY N/A 10/12/2019   Procedure: Cervical Three-Five Posterior Cervical Fusion with Laminectomy and Resection of Tumor;  Surgeon: Manya Sells, MD;  Location: [facility] OR;  Service: Neurosurgery;  Laterality: N/A;  Cervical 3-4 Posterior cervical fusion with laminectomy and resection of tumor   TONSILLECTOMY     WISDOM TOOTH EXTRACTION     Patient Active Problem List   Diagnosis Date Noted   Status post cervical spinal fusion 10/12/2019   Elevated BP without diagnosis of hypertension 10/05/2019   COVID-19 virus infection 10/04/2019   Leukocytosis 10/04/2019   Metastatic cancer to spine (HCC) 10/02/2019    ONSET DATE: January 2025  REFERRING DIAG:  pressure wound of coccygeal region-new referral area  Diagnosis  L89.306 (ICD-10-CM) - Pressure-induced deep tissue damage of unspecified buttock initial referral still working on     THERAPY DIAGnosis :  pressure wound of coccygeal  region-new referral area  Diagnosis  L89.306 (ICD-10-CM) - Pressure-induced deep tissue damage of unspecified buttock initial referral still working on   Back pain   hx   Patient had surgery for his midback on 12/16/23 (see above) due to metastatic cancer to the spine (patient has rods and screws). Stayed in the hospital until 12/30/23. Went to the rehab hospital in [place] 12/30/23 till 01/17/24 where patient had PT/OT.  Pt was very debilitated and was bed bound causing the first wound in the sacral area.  Pt was doing well until he had a set back and was readmitted to the hospitalized due to fluid in his lungs. During this time his mobility declined and he was sitting in the wheelchair more and now a second wound has appeared.  He now has orders for wound care for both areas.   Pt is sleeping in his recliner.    Rationale for Evaluation and Treatment: Rehabilitation    05/18/24 0001  Subjective Assessment  Subjective Pt pain is all in his knee not in his buttock area.  Wife states the soonest [facility] wound center can get him in is in August.   Patient and Family Stated Goals wound to heal  Prior Treatments care at rehab and self care at home  Pain Assessment  Pain Scale 0-10  Pain Score 1 (in buttock; Rt knee pain is an 8/10)  Evaluation and Treatment  Evaluation and Treatment Procedures Explained to Patient/Family Yes  Evaluation and Treatment Procedures agreed to  Wound / Incision (Open or Dehisced) 04/14/24 Buttocks Left  Date First Assessed/Time First Assessed: 04/14/24 1100   Location: Buttocks  Location Orientation: Left  Present on Admission: Yes  Dressing Type None  Dressing Status None  Dressing Change Frequency PRN  Site / Wound Assessment Black  % Wound base Yellow/Fibrinous Exudate 70%  % Wound base Black/Eschar 30%  Peri-wound Assessment Erythema (blanchable)  Wound Length (cm) 5 cm  Wound Width (cm) 3.5 cm  Wound Depth (cm) 3 cm  Wound Volume (cm^3) 52.5 cm^3  Wound  Surface Area (cm^2) 17.5 cm^2  Undermining (cm) 12 oclock 4.2 cm; 3 o'clock 1 cm; 6 o'clock 1.8 cm;9 o'clock 1.2 cm  Drainage Amount Copious  Treatment Cleansed;Debridement (Selective)  Wound / Incision (Open or Dehisced) 02/08/24 Other (Comment) Sacrum Mid sacral  Date First Assessed/Time First Assessed: 02/08/24 0810   Wound Type: (c) Other (Comment)  Location: Sacrum  Location Orientation: Mid  Wound Description (Comments): sacral  Present on Admission: Yes  Dressing Type Honey  Dressing Status Old drainage  Dressing Change Frequency PRN  Site / Wound Assessment Clean;Yellow  Peri-wound Assessment Intact;Erythema (blanchable)  Wound Length (cm) 0.3 cm  Wound Width (cm) 0.6 cm  Wound Depth (cm) 0.1 cm  Wound Volume (cm^3) 0.02 cm^3  Wound Surface Area (cm^2) 0.18 cm^2  Drainage Amount None  Drainage Description Serous  Treatment Cleansed  Selective Debridement (non-excisional)  Selective Debridement (non-excisional) - Location wound bed and edges  Selective Debridement (non-excisional) - Tools Used Scalpel;Forceps;Scissors  Selective Debridement (non-excisional) - Tissue Removed slough  Wound Therapy - Assess/Plan/Recommendations  Wound Therapy - Clinical Statement see below  Wound Therapy - Functional Problem List difficult/painful to sit  Factors Delaying/Impairing Wound Healing Immobility;Multiple medical problems;Polypharmacy  Hydrotherapy Plan Debridement;Dressing change;Patient/family education  Wound Therapy - Frequency Other (comment) (1 x a week for debridement; wife is taking care of wound)  Wound Plan Began bulb syring irrigation inside the wound followed by  for debridement and to ensure a wound healing environment is maintained.  Wound Therapy  Dressing  medihoney to sacral wound, silverhydrofiber packed into Lt buttock wound followed by 4x4 foam dressing secured with medipore tape   PATIENT EDUCATION: Education details: keep pressure off, increase water and protein  intake, Complete 10 glut sets per hour.  Stand every hour work up to 15 minutes per hour of standing.  Person educated: Spouse Education method: Explanation and Verbal cues Education comprehension: verbalized understanding   HOME EXERCISE PROGRAM: 10 glut sets every hour.    GOALS: Goals reviewed with patient? No  SHORT TERM GOALS: Target date: 02/29/24 revised target date of 05/12/24  PT pain to be no greater than a 1 Baseline: Goal status: IN PROGRESS  2.  Wounds to be 100% granulated  Baseline:  Goal status: IN PROGRESS  LONG TERM GOALS: Target date: 03/21/24; revised target date of 673/25  Pt to have no pain  Baseline:  Goal status: IN PROGRESS  2.  PT wounds to be healed  Baseline:  Goal status: IN PROGRESS   ASSESSMENT:  CLINICAL IMPRESSION:   After initial eval wound became significantly worse quickly.  It has now evened out.  PT has significant depth and undermining in wound with significant drainage.  Therapist began bulb irrigation to cleanse inside wound.  Therapist gave Wife irrigation bulb and encouraged her to complete this at home. PT wound  continues to have significant amount of adherent eschar and slough.  PT will continue to benefit from skilled PT until he can get into Wound care.   IOBJECTIVE IMPAIRMENTS: difficulty walking, pain, and decreased skin integrity .   ACTIVITY LIMITATIONS: sitting, bathing, toileting, and dressing  PERSONAL FACTORS: Fitness and 1-2 comorbidities: cancer, immobility polypharmacy are also affecting patient's functional outcome.   REHAB POTENTIAL: Good  CLINICAL DECISION MAKING: Evolving/moderate complexity  EVALUATION COMPLEXITY: Moderate  PLAN: PT FREQUENCY: 1x/week  PT DURATION: 8 weeks  PLANNED INTERVENTIONS: 97535- Self Care and 40981- Wound care (first 20 sq cm)  PLAN FOR NEXT SESSION: debride, assess wound bed and change dressing as indicated.  Possible wound vac.   Leodis Rainwater, PT CLT [phone] [facility] Outpatient Rehabilitation [facility] Ph: [phone] 05/18/2024, 2:35 PM

## 2024-05-23 ENCOUNTER — Encounter (HOSPITAL_COMMUNITY)

## 2024-05-25 ENCOUNTER — Ambulatory Visit (HOSPITAL_COMMUNITY): Admitting: Physical Therapy

## 2024-05-25 DIAGNOSIS — S31829S Unspecified open wound of left buttock, sequela: Secondary | ICD-10-CM

## 2024-05-25 DIAGNOSIS — S31000D Unspecified open wound of lower back and pelvis without penetration into retroperitoneum, subsequent encounter: Secondary | ICD-10-CM

## 2024-05-25 NOTE — Therapy (Signed)
 OUTPATIENT PHYSICAL THERAPY WOUND TREATMENT   Patient Name: Scott Eaton MRN: [id number] DOB:[birth year], 48 y.o., male Today's Date: 05/25/2024  PCP: none REFERRING PROVIDER: Cloretta Danes, MD/Jones, Jackie Mas, MD  END OF SESSION:  PT End of Session - 05/25/24 1316     Visit Number 11    Number of Visits 13    Date for PT Re-Evaluation 06/08/24    Authorization Type [employer] Comm PPO (no auth, visit limit 60 combined with OT)    Authorization - Visit Number 32    Authorization - Number of Visits 60    Progress Note Due on Visit 33    PT Start Time 1240    PT Stop Time 1315    PT Time Calculation (min) 35 min    Activity Tolerance Patient tolerated treatment well    Behavior During Therapy WFL for tasks assessed/performed           Past Medical History:  Diagnosis Date   Cervical radiculopathy    cervical stenosis   Complication of anesthesia    They had a problem putting pt to sleep   Obesity    Past Surgical History:  Procedure Laterality Date   LAMINECTOMY N/A 10/12/2019   Procedure: Cervical Three-Five Posterior Cervical Fusion with Laminectomy and Resection of Tumor;  Surgeon: Manya Sells, MD;  Location: [facility] OR;  Service: Neurosurgery;  Laterality: N/A;  Cervical 3-4 Posterior cervical fusion with laminectomy and resection of tumor   TONSILLECTOMY     WISDOM TOOTH EXTRACTION     Patient Active Problem List   Diagnosis Date Noted   Status post cervical spinal fusion 10/12/2019   Elevated BP without diagnosis of hypertension 10/05/2019   COVID-19 virus infection 10/04/2019   Leukocytosis 10/04/2019   Metastatic cancer to spine (HCC) 10/02/2019    ONSET DATE: January 2025  REFERRING DIAG:  pressure wound of coccygeal region-new referral area  Diagnosis  L89.306 (ICD-10-CM) - Pressure-induced deep tissue damage of unspecified buttock initial referral still working on     THERAPY DIAGnosis :  pressure wound of coccygeal region-new referral area   Diagnosis  L89.306 (ICD-10-CM) - Pressure-induced deep tissue damage of unspecified buttock initial referral still working on   Back pain   hx   Patient had surgery for his midback on 12/16/23 (see above) due to metastatic cancer to the spine (patient has rods and screws). Stayed in the hospital until 12/30/23. Went to the rehab hospital in [place] 12/30/23 till 01/17/24 where patient had PT/OT.  Pt was very debilitated and was bed bound causing the first wound in the sacral area.  Pt was doing well until he had a set back and was readmitted to the hospitalized due to fluid in his lungs. During this time his mobility declined and he was sitting in the wheelchair more and now a second wound has appeared.  He now has orders for wound care for both areas.   Pt is sleeping in his recliner.    Rationale for Evaluation and Treatment: Rehabilitation    05/25/24 0001  Subjective Assessment  Subjective PT states that he has an appointment with wound clinic on Monday.  Went to do the PET scan but could not complete due to knee pain and anxiety.  Patient and Family Stated Goals wound to heal  Prior Treatments care at rehab and self care at home  Pain Assessment  Pain Scale 0-10  Pain Score 1 (for buttock pain)  Evaluation and Treatment  Evaluation and Treatment Procedures  Explained to Patient/Family Yes  Evaluation and Treatment Procedures agreed to  Wound / Incision (Open or Dehisced) 04/14/24 Buttocks Left  Date First Assessed/Time First Assessed: 04/14/24 1100   Location: Buttocks  Location Orientation: Left  Present on Admission: Yes  Wound Image    Dressing Type None  Dressing Status None  Dressing Change Frequency PRN  Site / Wound Assessment Black  % Wound base Red or Granulating 60%  % Wound base Yellow/Fibrinous Exudate 40%  Peri-wound Assessment Erythema (blanchable)  Wound Length (cm) 5.5 cm  Wound Width (cm) 3 cm  Wound Depth (cm) 4 cm  Wound Volume (cm^3) 66 cm^3  Wound Surface Area  (cm^2) 16.5 cm^2  Undermining (cm) 12 0'clock 4.4cm; 3 o'clock .7cm, 6o'clock 1.5cm; 9 o/clock 1.8 cm  Drainage Amount Moderate  Treatment Cleansed;Debridement (Selective)  Wound / Incision (Open or Dehisced) 02/08/24 Other (Comment) Sacrum Mid sacral  Date First Assessed/Time First Assessed: 02/08/24 0810   Wound Type: (c) Other (Comment)  Location: Sacrum  Location Orientation: Mid  Wound Description (Comments): sacral  Present on Admission: Yes  Dressing Type Silver dressings  Dressing Status Old drainage  Dressing Change Frequency PRN  Site / Wound Assessment Clean;Yellow  Peri-wound Assessment Intact;Erythema (blanchable)  Wound Length (cm) 0.3 cm  Wound Width (cm) 0.1 cm  Wound Depth (cm) 0.1 cm  Wound Volume (cm^3) 0 cm^3  Wound Surface Area (cm^2) 0.03 cm^2  Drainage Amount None  Drainage Description Serous  Treatment Cleansed  Selective Debridement (non-excisional)  Selective Debridement (non-excisional) - Location wound bed and edges  Selective Debridement (non-excisional) - Tools Used Scalpel;Forceps;Scissors  Selective Debridement (non-excisional) - Tissue Removed slough  Wound Therapy - Assess/Plan/Recommendations  Wound Therapy - Clinical Statement see below  Wound Therapy - Functional Problem List difficult/painful to sit  Factors Delaying/Impairing Wound Healing Immobility;Multiple medical problems;Polypharmacy  Hydrotherapy Plan Debridement;Dressing change;Patient/family education  Wound Therapy - Frequency Other (comment) (1 x a week for debridement; wife is taking care of wound)  Wound Plan cleansed with washcloth wife is using bulb syringe.  Wound Therapy  Dressing  silver hydrofiber placed into wound followed by 4x4 and foam dressing secured by medipore tape.    PATIENT EDUCATION: Education details: keep pressure off, increase water and protein intake, Complete 10 glut sets per hour.  Stand every hour work up to 15 minutes per hour of standing.  Person  educated: Spouse Education method: Explanation and Verbal cues Education comprehension: verbalized understanding   HOME EXERCISE PROGRAM: 10 glut sets every hour.    GOALS: Goals reviewed with patient? No  SHORT TERM GOALS: Target date: 02/29/24 revised target date of 05/12/24  PT pain to be no greater than a 1 Baseline: Goal status: MET  2.  Wounds to be 100% granulated  Baseline:  Goal status: IN PROGRESS  LONG TERM GOALS: Target date: 03/21/24; revised target date of 673/25  Pt to have no pain  Baseline:  Goal status: IN PROGRESS  2.  PT wounds to be healed  Baseline:  Goal status: IN PROGRESS   ASSESSMENT:  CLINICAL IMPRESSION:  Pt wound has significant improved granulation with decreased slough.  Wound continues to have moderate drainage.  Pt has appointment at wound center on Monday, therapist asked wife to ask if pt is a wound vac candidate.  We will await pt wound care evaluation to see how MD would like to manage this wound.   Bulb irrigation appears to be helping with wound healing.  .  PT will continue  to benefit from skilled PT until he can get into Wound care.   IOBJECTIVE IMPAIRMENTS: difficulty walking, pain, and decreased skin integrity .   ACTIVITY LIMITATIONS: sitting, bathing, toileting, and dressing  PERSONAL FACTORS: Fitness and 1-2 comorbidities: cancer, immobility polypharmacy are also affecting patient's functional outcome.   REHAB POTENTIAL: Good  CLINICAL DECISION MAKING: Evolving/moderate complexity  EVALUATION COMPLEXITY: Moderate  PLAN: PT FREQUENCY: 1x/week  PT DURATION: 8 weeks  PLANNED INTERVENTIONS: 97535- Self Care and 16109- Wound care (first 20 sq cm)  PLAN FOR NEXT SESSION: debride, assess wound bed and change dressing as indicated.  Possible wound vac.   Leodis Rainwater, PT CLT [phone] [facility] Outpatient Rehabilitation [facility] Ph: [phone] 05/25/2024, 1:29 PM

## 2024-06-29 ENCOUNTER — Encounter (HOSPITAL_BASED_OUTPATIENT_CLINIC_OR_DEPARTMENT_OTHER): Admitting: General Surgery

## 2024-07-07 NOTE — Progress Notes (Signed)
 Patient name: Scott Eaton MRN: [id number] Date: 07/02/2024  Time of death: 0139  [place] notified at: 0156 [name]  Decedent care notified at: 0201  All belongings accounted for and taken home by family.

## 2024-07-07 DEATH — deceased
